# Patient Record
Sex: Female | Born: 1935
Health system: Southern US, Community
[De-identification: ages and names within clinical notes are randomized; demographics above are authoritative.]

## PROBLEM LIST (undated history)

## (undated) DIAGNOSIS — M1990 Unspecified osteoarthritis, unspecified site: Secondary | ICD-10-CM

## (undated) DIAGNOSIS — I7300 Raynaud's syndrome without gangrene: Secondary | ICD-10-CM

## (undated) DIAGNOSIS — R51 Headache: Secondary | ICD-10-CM

## (undated) DIAGNOSIS — I10 Essential (primary) hypertension: Secondary | ICD-10-CM

## (undated) HISTORY — PX: EYE SURGERY: SHX253

## (undated) HISTORY — PX: ABDOMINAL HYSTERECTOMY: SHX81

## (undated) HISTORY — PX: WRIST GANGLION EXCISION: SUR520

## (undated) HISTORY — DX: Essential (primary) hypertension: I10

## (undated) HISTORY — PX: OTHER SURGICAL HISTORY: SHX169

## (undated) HISTORY — PX: BACK SURGERY: SHX140

## (undated) HISTORY — PX: TONSILLECTOMY: SUR1361

---

## 1998-12-16 ENCOUNTER — Other Ambulatory Visit: Admission: RE | Admit: 1998-12-16 | Discharge: 1998-12-16 | Payer: Self-pay | Admitting: Obstetrics and Gynecology

## 2000-01-05 ENCOUNTER — Other Ambulatory Visit: Admission: RE | Admit: 2000-01-05 | Discharge: 2000-01-05 | Payer: Self-pay | Admitting: Obstetrics and Gynecology

## 2000-09-14 ENCOUNTER — Encounter: Admission: RE | Admit: 2000-09-14 | Discharge: 2000-09-14 | Payer: Self-pay | Admitting: Internal Medicine

## 2000-09-14 ENCOUNTER — Encounter: Payer: Self-pay | Admitting: Internal Medicine

## 2001-01-31 ENCOUNTER — Other Ambulatory Visit: Admission: RE | Admit: 2001-01-31 | Discharge: 2001-01-31 | Payer: Self-pay | Admitting: Obstetrics and Gynecology

## 2001-12-11 ENCOUNTER — Encounter: Payer: Self-pay | Admitting: Gastroenterology

## 2001-12-11 ENCOUNTER — Encounter: Admission: RE | Admit: 2001-12-11 | Discharge: 2001-12-11 | Payer: Self-pay | Admitting: Gastroenterology

## 2002-02-05 ENCOUNTER — Other Ambulatory Visit: Admission: RE | Admit: 2002-02-05 | Discharge: 2002-02-05 | Payer: Self-pay | Admitting: Obstetrics and Gynecology

## 2002-11-05 ENCOUNTER — Ambulatory Visit (HOSPITAL_COMMUNITY): Admission: RE | Admit: 2002-11-05 | Discharge: 2002-11-05 | Payer: Self-pay | Admitting: Neurosurgery

## 2002-11-05 ENCOUNTER — Encounter: Payer: Self-pay | Admitting: Neurosurgery

## 2003-01-08 ENCOUNTER — Ambulatory Visit (HOSPITAL_COMMUNITY): Admission: RE | Admit: 2003-01-08 | Discharge: 2003-01-08 | Payer: Self-pay | Admitting: Gastroenterology

## 2003-03-18 ENCOUNTER — Other Ambulatory Visit: Admission: RE | Admit: 2003-03-18 | Discharge: 2003-03-18 | Payer: Self-pay | Admitting: Obstetrics and Gynecology

## 2004-03-23 ENCOUNTER — Other Ambulatory Visit: Admission: RE | Admit: 2004-03-23 | Discharge: 2004-03-23 | Payer: Self-pay | Admitting: Obstetrics and Gynecology

## 2004-03-27 ENCOUNTER — Ambulatory Visit (HOSPITAL_COMMUNITY): Admission: RE | Admit: 2004-03-27 | Discharge: 2004-03-27 | Payer: Self-pay | Admitting: Obstetrics and Gynecology

## 2004-08-10 ENCOUNTER — Other Ambulatory Visit: Admission: RE | Admit: 2004-08-10 | Discharge: 2004-08-10 | Payer: Self-pay | Admitting: Obstetrics and Gynecology

## 2005-07-19 ENCOUNTER — Encounter: Admission: RE | Admit: 2005-07-19 | Discharge: 2005-07-19 | Payer: Self-pay | Admitting: Orthopaedic Surgery

## 2005-08-16 ENCOUNTER — Encounter: Admission: RE | Admit: 2005-08-16 | Discharge: 2005-08-16 | Payer: Self-pay | Admitting: Orthopaedic Surgery

## 2006-08-12 ENCOUNTER — Encounter: Admission: RE | Admit: 2006-08-12 | Discharge: 2006-08-12 | Payer: Self-pay | Admitting: Orthopaedic Surgery

## 2006-08-29 ENCOUNTER — Encounter: Admission: RE | Admit: 2006-08-29 | Discharge: 2006-08-29 | Payer: Self-pay | Admitting: Orthopaedic Surgery

## 2010-02-15 ENCOUNTER — Encounter: Payer: Self-pay | Admitting: Orthopaedic Surgery

## 2010-12-08 ENCOUNTER — Other Ambulatory Visit: Payer: Self-pay | Admitting: Neurosurgery

## 2011-01-12 ENCOUNTER — Other Ambulatory Visit: Payer: Self-pay | Admitting: Neurosurgery

## 2011-01-22 ENCOUNTER — Other Ambulatory Visit (HOSPITAL_COMMUNITY): Payer: Self-pay

## 2011-01-22 ENCOUNTER — Encounter (HOSPITAL_COMMUNITY): Payer: Self-pay

## 2011-01-29 ENCOUNTER — Other Ambulatory Visit (HOSPITAL_COMMUNITY): Payer: Self-pay

## 2011-02-02 ENCOUNTER — Encounter (HOSPITAL_COMMUNITY)
Admission: RE | Admit: 2011-02-02 | Discharge: 2011-02-02 | Disposition: A | Discharge status: Home / Self Care | Payer: Medicare Other | Source: Ambulatory Visit | Attending: Neurosurgery | Admitting: Neurosurgery

## 2011-02-02 ENCOUNTER — Encounter (HOSPITAL_COMMUNITY): Payer: Self-pay

## 2011-02-02 ENCOUNTER — Other Ambulatory Visit: Payer: Self-pay

## 2011-02-02 HISTORY — DX: Unspecified osteoarthritis, unspecified site: M19.90

## 2011-02-02 HISTORY — DX: Headache: R51

## 2011-02-02 LAB — BASIC METABOLIC PANEL
BUN: 22 mg/dL (ref 6–23)
Chloride: 105 mEq/L (ref 96–112)
GFR calc Af Amer: >90 mL/min (ref >90)
GFR calc non Af Amer: 84 mL/min — ABNORMAL LOW (ref >90)
Potassium: 4.2 mEq/L (ref 3.5–5.1)
Sodium: 143 mEq/L (ref 135–145)

## 2011-02-02 LAB — TYPE AND SCREEN
ABO/RH(D): B POS
Antibody Screen: NEGATIVE

## 2011-02-02 LAB — SURGICAL PCR SCREEN
MRSA, PCR: NEGATIVE
Staphylococcus aureus: POSITIVE — AB

## 2011-02-02 LAB — CBC
HCT: 38.7 % (ref 36.0–46.0)
Hemoglobin: 13.2 g/dL (ref 12.0–15.0)
MCHC: 34.1 g/dL (ref 30.0–36.0)
RBC: 3.91 MIL/uL (ref 3.87–5.11)
WBC: 6.1 10*3/uL (ref 4.0–10.5)

## 2011-02-02 LAB — ABO/RH: ABO/RH(D): B POS

## 2011-02-02 NOTE — Pre-Procedure Instructions (Signed)
22 Danielle Hernandez  02/02/2011   Your procedure is scheduled on: 02-08-2011 @ 7:30 AM Report to [facility] at 5:30 AM.  Call this number if you have problems the morning of surgery: [phone]   Remember:   Do not eat food:After Midnight.  May have clear liquids: up to 4 Hours before arrival.  Clear liquids include soda, tea, black coffee, apple or grape juice, broth.1:30AM  Take these medicines the morning of surgery with A SIP OF WATER:hydrocodone   Do not wear jewelry, make-up or nail polish.  Do not wear lotions, powders, or perfumes. You may wear deodorant.  Do not shave 48 hours prior to surgery.  Do not bring valuables to the hospital.  Contacts, dentures or bridgework may not be worn into surgery.  Leave suitcase in the car. After surgery it may be brought to your room.  For patients admitted to the hospital, checkout time is 11:00 AM the day of discharge.    Special Instructions: CHG Shower Use Special Wash: 1/2 bottle night before surgery and 1/2 bottle morning of surgery.   Please read over the following fact sheets that you were given: Pain Booklet, Blood Transfusion Information, MRSA Information and Surgical Site Infection Prevention

## 2011-02-07 MED ORDER — CEFAZOLIN SODIUM 1-5 GM-% IV SOLN
1.0000 g | INTRAVENOUS | Status: AC
Start: 2011-02-08 — End: 2011-02-08
  Administered 2011-02-08 (×2): .5 g via INTRAVENOUS
  Administered 2011-02-08: 1 g via INTRAVENOUS
  Filled 2011-02-07: qty 50

## 2011-02-08 ENCOUNTER — Inpatient Hospital Stay (HOSPITAL_COMMUNITY)
Admission: RE | Admit: 2011-02-08 | Discharge: 2011-02-10 | DRG: 460 | Disposition: A | Discharge status: Home / Self Care | Payer: Medicare Other | Source: Ambulatory Visit | Attending: Neurosurgery | Admitting: Neurosurgery

## 2011-02-08 ENCOUNTER — Encounter (HOSPITAL_COMMUNITY): Payer: Self-pay | Admitting: *Deleted

## 2011-02-08 ENCOUNTER — Inpatient Hospital Stay (HOSPITAL_COMMUNITY): Payer: Medicare Other

## 2011-02-08 ENCOUNTER — Inpatient Hospital Stay (HOSPITAL_COMMUNITY): Payer: Medicare Other | Admitting: *Deleted

## 2011-02-08 ENCOUNTER — Encounter (HOSPITAL_COMMUNITY)
Admission: RE | Disposition: A | Discharge status: Home / Self Care | Payer: Self-pay | Source: Ambulatory Visit | Attending: Neurosurgery

## 2011-02-08 DIAGNOSIS — Z79899 Other long term (current) drug therapy: Secondary | ICD-10-CM

## 2011-02-08 DIAGNOSIS — Z23 Encounter for immunization: Secondary | ICD-10-CM

## 2011-02-08 DIAGNOSIS — Z01812 Encounter for preprocedural laboratory examination: Secondary | ICD-10-CM

## 2011-02-08 DIAGNOSIS — M48061 Spinal stenosis, lumbar region without neurogenic claudication: Secondary | ICD-10-CM | POA: Diagnosis present

## 2011-02-08 DIAGNOSIS — M810 Age-related osteoporosis without current pathological fracture: Secondary | ICD-10-CM | POA: Diagnosis present

## 2011-02-08 DIAGNOSIS — M4120 Other idiopathic scoliosis, site unspecified: Secondary | ICD-10-CM | POA: Diagnosis present

## 2011-02-08 DIAGNOSIS — M5137 Other intervertebral disc degeneration, lumbosacral region: Secondary | ICD-10-CM | POA: Diagnosis present

## 2011-02-08 DIAGNOSIS — M51379 Other intervertebral disc degeneration, lumbosacral region without mention of lumbar back pain or lower extremity pain: Secondary | ICD-10-CM | POA: Diagnosis present

## 2011-02-08 DIAGNOSIS — M47817 Spondylosis without myelopathy or radiculopathy, lumbosacral region: Secondary | ICD-10-CM | POA: Diagnosis present

## 2011-02-08 DIAGNOSIS — M5126 Other intervertebral disc displacement, lumbar region: Principal | ICD-10-CM | POA: Diagnosis present

## 2011-02-08 SURGERY — POSTERIOR LUMBAR FUSION 3 LEVEL
Anesthesia: General | Site: Back | Laterality: Bilateral | Wound class: Clean

## 2011-02-08 MED ORDER — ROCURONIUM BROMIDE 100 MG/10ML IV SOLN
INTRAVENOUS | Status: DC | PRN
  Administered 2011-02-08: 50 mg via INTRAVENOUS

## 2011-02-08 MED ORDER — KETOROLAC TROMETHAMINE 30 MG/ML IJ SOLN
INTRAMUSCULAR | Status: AC
  Filled 2011-02-08: qty 1

## 2011-02-08 MED ORDER — VECURONIUM BROMIDE 10 MG IV SOLR
INTRAVENOUS | Status: DC | PRN
  Administered 2011-02-08 (×2): 3 mg via INTRAVENOUS
  Administered 2011-02-08: 2 mg via INTRAVENOUS
  Administered 2011-02-08 (×3): 4 mg via INTRAVENOUS
  Administered 2011-02-08: 2 mg via INTRAVENOUS

## 2011-02-08 MED ORDER — CEFAZOLIN SODIUM 1-5 GM-% IV SOLN
INTRAVENOUS | Status: AC
  Filled 2011-02-08: qty 50

## 2011-02-08 MED ORDER — LACTATED RINGERS IV SOLN
INTRAVENOUS | Status: DC | PRN
  Administered 2011-02-08 (×2): via INTRAVENOUS

## 2011-02-08 MED ORDER — HYDROXYZINE HCL 50 MG/ML IM SOLN
50.0000 mg | INTRAMUSCULAR | Status: DC | PRN
  Filled 2011-02-08: qty 1

## 2011-02-08 MED ORDER — ONDANSETRON HCL 4 MG/2ML IJ SOLN
INTRAMUSCULAR | Status: DC | PRN
  Administered 2011-02-08: 4 mg via INTRAVENOUS

## 2011-02-08 MED ORDER — FENTANYL CITRATE 0.05 MG/ML IJ SOLN
INTRAMUSCULAR | Status: DC | PRN
  Administered 2011-02-08 (×2): 50 ug via INTRAVENOUS
  Administered 2011-02-08: 100 ug via INTRAVENOUS
  Administered 2011-02-08 (×2): 50 ug via INTRAVENOUS
  Administered 2011-02-08: 100 ug via INTRAVENOUS
  Administered 2011-02-08 (×2): 50 ug via INTRAVENOUS

## 2011-02-08 MED ORDER — ACETAMINOPHEN 650 MG RE SUPP: 650.0000 mg | RECTAL | Status: DC | PRN

## 2011-02-08 MED ORDER — KETOROLAC TROMETHAMINE 30 MG/ML IJ SOLN
30.0000 mg | Freq: Once | INTRAMUSCULAR | Status: AC
  Administered 2011-02-08: 30 mg via INTRAVENOUS

## 2011-02-08 MED ORDER — SODIUM CHLORIDE 0.9 % IR SOLN
Status: DC | PRN
  Administered 2011-02-08: 09:00:00

## 2011-02-08 MED ORDER — GLYCOPYRROLATE 0.2 MG/ML IJ SOLN
INTRAMUSCULAR | Status: DC | PRN
  Administered 2011-02-08: .5 mg via INTRAVENOUS

## 2011-02-08 MED ORDER — BACITRACIN 50000 UNITS IM SOLR
INTRAMUSCULAR | Status: AC
  Filled 2011-02-08: qty 1

## 2011-02-08 MED ORDER — SODIUM CHLORIDE 0.9 % IJ SOLN: 9.0000 mL | INTRAMUSCULAR | Status: DC | PRN

## 2011-02-08 MED ORDER — OXYCODONE-ACETAMINOPHEN 5-325 MG PO TABS
1.0000 | ORAL_TABLET | ORAL | Status: DC | PRN
  Administered 2011-02-10: 2 via ORAL
  Filled 2011-02-08: qty 2

## 2011-02-08 MED ORDER — LACTATED RINGERS IV SOLN: INTRAVENOUS | Status: DC

## 2011-02-08 MED ORDER — PROPOFOL 10 MG/ML IV EMUL
INTRAVENOUS | Status: DC | PRN
  Administered 2011-02-08: 130 mg via INTRAVENOUS

## 2011-02-08 MED ORDER — MORPHINE SULFATE 2 MG/ML IJ SOLN: 0.0500 mg/kg | INTRAMUSCULAR | Status: DC | PRN

## 2011-02-08 MED ORDER — SODIUM CHLORIDE 0.9 % IV SOLN: 250.0000 mL | INTRAVENOUS | Status: DC

## 2011-02-08 MED ORDER — ACETAMINOPHEN 325 MG PO TABS
650.0000 mg | ORAL_TABLET | ORAL | Status: DC | PRN
  Administered 2011-02-08 – 2011-02-09 (×2): 650 mg via ORAL
  Administered 2011-02-09: 325 mg via ORAL
  Filled 2011-02-08 (×2): qty 2
  Filled 2011-02-08: qty 1

## 2011-02-08 MED ORDER — PROMETHAZINE HCL 25 MG/ML IJ SOLN: 6.2500 mg | INTRAMUSCULAR | Status: DC | PRN

## 2011-02-08 MED ORDER — PNEUMOCOCCAL VAC POLYVALENT 25 MCG/0.5ML IJ INJ
0.5000 mL | INJECTION | INTRAMUSCULAR | Status: AC
  Administered 2011-02-09: 0.5 mL via INTRAMUSCULAR
  Filled 2011-02-08: qty 0.5

## 2011-02-08 MED ORDER — ZOLPIDEM TARTRATE 5 MG PO TABS
5.0000 mg | ORAL_TABLET | Freq: Every evening | ORAL | Status: DC | PRN
  Administered 2011-02-09: 5 mg via ORAL
  Filled 2011-02-08: qty 1

## 2011-02-08 MED ORDER — CYCLOBENZAPRINE HCL 10 MG PO TABS
5.0000 mg | ORAL_TABLET | Freq: Three times a day (TID) | ORAL | Status: DC | PRN
  Administered 2011-02-08 – 2011-02-09 (×2): 10 mg via ORAL
  Filled 2011-02-08 (×2): qty 1

## 2011-02-08 MED ORDER — DIPHENHYDRAMINE HCL 12.5 MG/5ML PO ELIX: 12.5000 mg | ORAL_SOLUTION | Freq: Four times a day (QID) | ORAL | Status: DC | PRN

## 2011-02-08 MED ORDER — ONDANSETRON HCL 4 MG/2ML IJ SOLN: 4.0000 mg | Freq: Four times a day (QID) | INTRAMUSCULAR | Status: DC | PRN

## 2011-02-08 MED ORDER — SODIUM CHLORIDE 0.9 % IV SOLN
INTRAVENOUS | Status: AC
  Filled 2011-02-08: qty 500

## 2011-02-08 MED ORDER — MEPERIDINE HCL 25 MG/ML IJ SOLN: 6.2500 mg | INTRAMUSCULAR | Status: DC | PRN

## 2011-02-08 MED ORDER — HYDROMORPHONE HCL PF 1 MG/ML IJ SOLN: 0.2500 mg | INTRAMUSCULAR | Status: DC | PRN

## 2011-02-08 MED ORDER — MENTHOL 3 MG MT LOZG: 1.0000 | LOZENGE | OROMUCOSAL | Status: DC | PRN

## 2011-02-08 MED ORDER — THROMBIN 20000 UNITS EX KIT
PACK | OROMUCOSAL | Status: DC | PRN
  Administered 2011-02-08 (×2): via TOPICAL

## 2011-02-08 MED ORDER — HYDROCODONE-ACETAMINOPHEN 5-325 MG PO TABS
1.0000 | ORAL_TABLET | ORAL | Status: DC | PRN
  Administered 2011-02-09: 2 via ORAL
  Administered 2011-02-09: 1 via ORAL
  Administered 2011-02-10: 2 via ORAL
  Filled 2011-02-08: qty 2
  Filled 2011-02-08: qty 1
  Filled 2011-02-08: qty 2

## 2011-02-08 MED ORDER — MIDAZOLAM HCL 5 MG/5ML IJ SOLN
INTRAMUSCULAR | Status: DC | PRN
  Administered 2011-02-08 (×2): 1 mg via INTRAVENOUS

## 2011-02-08 MED ORDER — NEOSTIGMINE METHYLSULFATE 1 MG/ML IJ SOLN
INTRAMUSCULAR | Status: DC | PRN
  Administered 2011-02-08: 3 mg via INTRAVENOUS

## 2011-02-08 MED ORDER — LACTATED RINGERS IV SOLN
INTRAVENOUS | Status: DC | PRN
  Administered 2011-02-08 (×2): via INTRAVENOUS

## 2011-02-08 MED ORDER — CEFAZOLIN SODIUM 1-5 GM-% IV SOLN
1.0000 g | INTRAVENOUS | Status: AC
  Filled 2011-02-08: qty 50

## 2011-02-08 MED ORDER — ESTROGENS CONJUGATED 0.625 MG PO TABS
0.6250 mg | ORAL_TABLET | ORAL | Status: DC
  Administered 2011-02-10: 0.625 mg via ORAL
  Filled 2011-02-08 (×2): qty 1

## 2011-02-08 MED ORDER — BISACODYL 10 MG RE SUPP: 10.0000 mg | Freq: Every day | RECTAL | Status: DC | PRN

## 2011-02-08 MED ORDER — BUPIVACAINE HCL (PF) 0.5 % IJ SOLN
INTRAMUSCULAR | Status: DC | PRN
  Administered 2011-02-08: 12.5 mL

## 2011-02-08 MED ORDER — KCL IN DEXTROSE-NACL 20-5-0.45 MEQ/L-%-% IV SOLN
INTRAVENOUS | Status: DC
  Administered 2011-02-08 (×2): via INTRAVENOUS
  Filled 2011-02-08 (×11): qty 1000

## 2011-02-08 MED ORDER — DIPHENHYDRAMINE HCL 50 MG/ML IJ SOLN: 12.5000 mg | Freq: Four times a day (QID) | INTRAMUSCULAR | Status: DC | PRN

## 2011-02-08 MED ORDER — MAGNESIUM HYDROXIDE 400 MG/5ML PO SUSP: 30.0000 mL | Freq: Every day | ORAL | Status: DC | PRN

## 2011-02-08 MED ORDER — HYDROXYZINE HCL 50 MG PO TABS
50.0000 mg | ORAL_TABLET | ORAL | Status: DC | PRN
  Filled 2011-02-08: qty 1

## 2011-02-08 MED ORDER — 0.9 % SODIUM CHLORIDE (POUR BTL) OPTIME
TOPICAL | Status: DC | PRN
  Administered 2011-02-08 (×3): 1000 mL

## 2011-02-08 MED ORDER — LIDOCAINE-EPINEPHRINE 1 %-1:100000 IJ SOLN
INTRAMUSCULAR | Status: DC | PRN
  Administered 2011-02-08: 12.5 mL

## 2011-02-08 MED ORDER — SODIUM CHLORIDE 0.9 % IJ SOLN: 3.0000 mL | INTRAMUSCULAR | Status: DC | PRN

## 2011-02-08 MED ORDER — NALOXONE HCL 0.4 MG/ML IJ SOLN: 0.4000 mg | INTRAMUSCULAR | Status: DC | PRN

## 2011-02-08 MED ORDER — THERA M PLUS PO TABS
1.0000 | ORAL_TABLET | Freq: Every day | ORAL | Status: DC
  Administered 2011-02-09 – 2011-02-10 (×2): 1 via ORAL
  Filled 2011-02-08 (×3): qty 1

## 2011-02-08 MED ORDER — MORPHINE SULFATE 4 MG/ML IJ SOLN: 4.0000 mg | INTRAMUSCULAR | Status: DC | PRN

## 2011-02-08 MED ORDER — KETOROLAC TROMETHAMINE 30 MG/ML IJ SOLN
30.0000 mg | Freq: Four times a day (QID) | INTRAMUSCULAR | Status: DC
  Administered 2011-02-08 – 2011-02-10 (×7): 30 mg via INTRAVENOUS
  Filled 2011-02-08 (×11): qty 1

## 2011-02-08 MED ORDER — SODIUM CHLORIDE 0.9 % IJ SOLN
3.0000 mL | Freq: Two times a day (BID) | INTRAMUSCULAR | Status: DC
  Administered 2011-02-08 – 2011-02-10 (×4): 3 mL via INTRAVENOUS

## 2011-02-08 MED ORDER — KCL IN DEXTROSE-NACL 20-5-0.45 MEQ/L-%-% IV SOLN
INTRAVENOUS | Status: AC
  Filled 2011-02-08: qty 1000

## 2011-02-08 MED ORDER — PHENOL 1.4 % MT LIQD: 1.0000 | OROMUCOSAL | Status: DC | PRN

## 2011-02-08 MED ORDER — MORPHINE SULFATE (PF) 1 MG/ML IV SOLN
INTRAVENOUS | Status: DC
  Administered 2011-02-08: 15:00:00 via INTRAVENOUS
  Administered 2011-02-08: 7 mg via INTRAVENOUS
  Administered 2011-02-09: 4 mg via INTRAVENOUS
  Administered 2011-02-09: 3 mg via INTRAVENOUS
  Filled 2011-02-08: qty 25

## 2011-02-08 MED ORDER — HETASTARCH-ELECTROLYTES 6 % IV SOLN
INTRAVENOUS | Status: DC | PRN
  Administered 2011-02-08: 09:00:00 via INTRAVENOUS

## 2011-02-08 MED ORDER — ALUM & MAG HYDROXIDE-SIMETH 200-200-20 MG/5ML PO SUSP: 30.0000 mL | Freq: Four times a day (QID) | ORAL | Status: DC | PRN

## 2011-02-08 SURGICAL SUPPLY — 98 items
ADH SKN CLS APL DERMABOND .7 (GAUZE/BANDAGES/DRESSINGS) ×1
APL SKNCLS STERI-STRIP NONHPOA (GAUZE/BANDAGES/DRESSINGS)
BAG DECANTER FOR FLEXI CONT (MISCELLANEOUS) ×2 IMPLANT
BENZOIN TINCTURE PRP APPL 2/3 (GAUZE/BANDAGES/DRESSINGS) IMPLANT
BLADE SURG ROTATE 9660 (MISCELLANEOUS) IMPLANT
BRUSH SCRUB EZ PLAIN DRY (MISCELLANEOUS) ×2 IMPLANT
BUR ACORN 6.0 ACORN (BURR) IMPLANT
BUR ACRON 5.0MM COATED (BURR) ×2 IMPLANT
BUR MATCHSTICK NEURO 3.0 LAGG (BURR) ×2 IMPLANT
CANISTER SUCTION 2500CC (MISCELLANEOUS) ×2 IMPLANT
CAP LCK SPNE (Orthopedic Implant) ×8 IMPLANT
CAP LOCK SPINE RADIUS (Orthopedic Implant) IMPLANT
CAP LOCKING (Orthopedic Implant) ×16 IMPLANT
CLOTH BEACON ORANGE TIMEOUT ST (SAFETY) ×2 IMPLANT
CONT SPEC 4OZ CLIKSEAL STRL BL (MISCELLANEOUS) ×4 IMPLANT
COVER BACK TABLE 24X17X13 BIG (DRAPES) IMPLANT
COVER TABLE BACK 60X90 (DRAPES) ×2 IMPLANT
DERMABOND ADVANCED (GAUZE/BANDAGES/DRESSINGS) ×1
DERMABOND ADVANCED .7 DNX12 (GAUZE/BANDAGES/DRESSINGS) ×2 IMPLANT
DRAPE C-ARM 42X72 X-RAY (DRAPES) ×4 IMPLANT
DRAPE LAPAROTOMY 100X72X124 (DRAPES) ×2 IMPLANT
DRAPE POUCH INSTRU U-SHP 10X18 (DRAPES) ×2 IMPLANT
DRAPE PROXIMA HALF (DRAPES) IMPLANT
DRAPE SURG 17X23 STRL (DRAPES) ×2 IMPLANT
DRESSING TELFA 8X3 (GAUZE/BANDAGES/DRESSINGS) IMPLANT
DRSG EMULSION OIL 3X3 NADH (GAUZE/BANDAGES/DRESSINGS) IMPLANT
DURAPREP 26ML APPLICATOR (WOUND CARE) ×2 IMPLANT
ELECT REM PT RETURN 9FT ADLT (ELECTROSURGICAL) ×2
ELECTRODE REM PT RTRN 9FT ADLT (ELECTROSURGICAL) ×1 IMPLANT
GAUZE SPONGE 4X4 16PLY XRAY LF (GAUZE/BANDAGES/DRESSINGS) ×2 IMPLANT
GLOVE BIO SURGEON STRL SZ8.5 (GLOVE) ×1 IMPLANT
GLOVE BIOGEL PI IND STRL 7.0 (GLOVE) IMPLANT
GLOVE BIOGEL PI IND STRL 8 (GLOVE) ×2 IMPLANT
GLOVE BIOGEL PI IND STRL 8.5 (GLOVE) IMPLANT
GLOVE BIOGEL PI INDICATOR 7.0 (GLOVE) ×1
GLOVE BIOGEL PI INDICATOR 8 (GLOVE) ×2
GLOVE BIOGEL PI INDICATOR 8.5 (GLOVE) ×4
GLOVE ECLIPSE 7.5 STRL STRAW (GLOVE) ×4 IMPLANT
GLOVE EXAM NITRILE LRG STRL (GLOVE) IMPLANT
GLOVE EXAM NITRILE MD LF STRL (GLOVE) ×2 IMPLANT
GLOVE EXAM NITRILE XL STR (GLOVE) IMPLANT
GLOVE EXAM NITRILE XS STR PU (GLOVE) IMPLANT
GLOVE SS BIOGEL STRL SZ 8 (GLOVE) IMPLANT
GLOVE SUPERSENSE BIOGEL SZ 8 (GLOVE) ×1
GLOVE SURG SS PI 6.5 STRL IVOR (GLOVE) ×1 IMPLANT
GLOVE SURG SS PI 8.0 STRL IVOR (GLOVE) ×5 IMPLANT
GOWN BRE IMP SLV AUR LG STRL (GOWN DISPOSABLE) ×3 IMPLANT
GOWN BRE IMP SLV AUR XL STRL (GOWN DISPOSABLE) ×3 IMPLANT
GOWN STRL REIN 2XL LVL4 (GOWN DISPOSABLE) ×3 IMPLANT
KIT BASIN OR (CUSTOM PROCEDURE TRAY) ×2 IMPLANT
KIT INFUSE MEDIUM (Orthopedic Implant) ×1 IMPLANT
KIT POSITION SURG JACKSON T1 (MISCELLANEOUS) ×1 IMPLANT
KIT ROOM TURNOVER OR (KITS) ×2 IMPLANT
MILL MEDIUM DISP (BLADE) ×2 IMPLANT
NDL 18GX1X1/2 (RX/OR ONLY) (NEEDLE) ×1 IMPLANT
NDL HYPO 25X1 1.5 SAFETY (NEEDLE) ×1 IMPLANT
NDL SPNL 18GX3.5 QUINCKE PK (NEEDLE) IMPLANT
NDL SPNL 22GX3.5 QUINCKE BK (NEEDLE) ×1 IMPLANT
NEEDLE 18GX1X1/2 (RX/OR ONLY) (NEEDLE) ×2 IMPLANT
NEEDLE BONE MARROW 8GAX6 (NEEDLE) IMPLANT
NEEDLE HYPO 25X1 1.5 SAFETY (NEEDLE) IMPLANT
NEEDLE SPNL 18GX3.5 QUINCKE PK (NEEDLE) ×2 IMPLANT
NEEDLE SPNL 22GX3.5 QUINCKE BK (NEEDLE) ×6 IMPLANT
NS IRRIG 1000ML POUR BTL (IV SOLUTION) ×4 IMPLANT
PACK LAMINECTOMY NEURO (CUSTOM PROCEDURE TRAY) ×2 IMPLANT
PAD ARMBOARD 7.5X6 YLW CONV (MISCELLANEOUS) ×7 IMPLANT
PATTIES SURGICAL .5 X.5 (GAUZE/BANDAGES/DRESSINGS) IMPLANT
PATTIES SURGICAL .5 X1 (DISPOSABLE) IMPLANT
PATTIES SURGICAL 1X1 (DISPOSABLE) ×2 IMPLANT
PEEK PLIF AVS 8X25X4 DEGREE (Peek) ×6 IMPLANT
ROD CROSS LINK SHORT (Rod) ×1 IMPLANT
ROD MAX 90MM (Rod) ×2 IMPLANT
SCREW 5.75X40M (Screw) ×4 IMPLANT
SCREW 5.75X45MM (Screw) ×4 IMPLANT
SLEEVE SURGEON STRL (DRAPES) ×1 IMPLANT
SPONGE GAUZE 4X4 12PLY (GAUZE/BANDAGES/DRESSINGS) ×2 IMPLANT
SPONGE LAP 4X18 X RAY DECT (DISPOSABLE) ×1 IMPLANT
SPONGE NEURO XRAY DETECT 1X3 (DISPOSABLE) ×1 IMPLANT
SPONGE SURGIFOAM ABS GEL 100 (HEMOSTASIS) ×2 IMPLANT
STAPLER SKIN PROX WIDE 3.9 (STAPLE) IMPLANT
STRIP CLOSURE SKIN 1/2X4 (GAUZE/BANDAGES/DRESSINGS) ×1 IMPLANT
STRIP VITOSS 25X100X4MM (Neuro Prosthesis/Implant) ×2 IMPLANT
SUT PROLENE 6 0 BV (SUTURE) IMPLANT
SUT VIC AB 1 CT1 18XBRD ANBCTR (SUTURE) ×2 IMPLANT
SUT VIC AB 1 CT1 8-18 (SUTURE) ×4
SUT VIC AB 2-0 CP2 18 (SUTURE) ×4 IMPLANT
SUT VIC AB 3-0 SH 8-18 (SUTURE) ×2 IMPLANT
SYR 20CC LL (SYRINGE) ×2 IMPLANT
SYR 3ML LL SCALE MARK (SYRINGE) ×8 IMPLANT
SYR 5ML LL (SYRINGE) IMPLANT
SYR CONTROL 10ML LL (SYRINGE) ×1 IMPLANT
SYR INSULIN 1ML 31GX6 SAFETY (SYRINGE) IMPLANT
TAPE CLOTH SURG 4X10 WHT LF (GAUZE/BANDAGES/DRESSINGS) ×1 IMPLANT
TOWEL OR 17X24 6PK STRL BLUE (TOWEL DISPOSABLE) ×2 IMPLANT
TOWEL OR 17X26 10 PK STRL BLUE (TOWEL DISPOSABLE) ×2 IMPLANT
TRAP SPECIMEN MUCOUS 40CC (MISCELLANEOUS) ×2 IMPLANT
TRAY FOLEY CATH 14FRSI W/METER (CATHETERS) ×2 IMPLANT
WATER STERILE IRR 1000ML POUR (IV SOLUTION) ×2 IMPLANT

## 2011-02-08 NOTE — Anesthesia Preprocedure Evaluation (Addendum)
Anesthesia Evaluation  Patient identified by MRN, date of birth, ID band Patient awake    Reviewed: Allergy & Precautions, H&P , NPO status , Patient's Chart, lab work & pertinent test results  Airway Mallampati: I TM Distance: >3 FB Neck ROM: Full    Dental  (+) Upper Dentures, Lower Dentures and Dental Advisory Given   Pulmonary neg pulmonary ROS,  clear to auscultation  Pulmonary exam normal       Cardiovascular neg cardio ROS Regular Normal    Neuro/Psych  Headaches, Negative Psych ROS   GI/Hepatic negative GI ROS, Neg liver ROS,   Endo/Other  Negative Endocrine ROS  Renal/GU negative Renal ROS  Genitourinary negative   Musculoskeletal negative musculoskeletal ROS (+)   Abdominal Normal abdominal exam  (+)   Peds  Hematology negative hematology ROS (+)   Anesthesia Other Findings   Reproductive/Obstetrics negative OB ROS                           Anesthesia Physical Anesthesia Plan  ASA: II  Anesthesia Plan: General   Post-op Pain Management:    Induction: Intravenous  Airway Management Planned: Oral ETT  Additional Equipment:   Intra-op Plan:   Post-operative Plan: Extubation in OR  Informed Consent: I have reviewed the patients History and Physical, chart, labs and discussed the procedure including the risks, benefits and alternatives for the proposed anesthesia with the patient or authorized representative who has indicated his/her understanding and acceptance.   Dental advisory given  Plan Discussed with: Anesthesiologist  Anesthesia Plan Comments:         Anesthesia Quick Evaluation

## 2011-02-08 NOTE — Transfer of Care (Signed)
Immediate Anesthesia Transfer of Care Note  Patient: KHRISTY KALAN  Procedure(s) Performed:  POSTERIOR LUMBAR FUSION 3 LEVEL - Lumbar two to five laminectomies with Lumbar two-three, three-four, four-five posterior lumbar interbody fusion with interbody prothesis, posterolateral arthrodesis, and posterior segmental instrumentation  Patient Location: PACU  Anesthesia Type: General  Level of Consciousness: patient cooperative and responds to stimulation  Airway & Oxygen Therapy: Patient Spontanous Breathing and Patient connected to face mask oxygen  Post-op Assessment: Report given to PACU RN and Post -op Vital signs reviewed and stable  Post vital signs: Reviewed Filed Vitals:   02/08/11 1400  BP: 101/46  Pulse: 76  Temp:   Resp: 17    Complications: No apparent anesthesia complications

## 2011-02-08 NOTE — Preoperative (Signed)
Beta Blockers   Reason not to administer Beta Blockers:Not Applicable 

## 2011-02-08 NOTE — Progress Notes (Signed)
Filed Vitals:   02/08/11 1515 02/08/11 1530 02/08/11 1533 02/08/11 1555  BP: 107/46 113/40  98/31  Pulse: 63 60 64 70  Temp:   96.8 F (36 C) 96.8 F (36 C)  TempSrc:    Oral  Resp: 21 18 14 18   Weight:      SpO2: 100% 98% 100% 97%    Patient doing well following surgery. Some incisional back discomfort but no significant discomfort at the lower extremities. Dressing is clean dry. Moving all  extremities well. Foley to straight drainage.   Plan: Discussed with nurse beginning ambulation this evening. If patient does well we'll DC Foley in a.m. Encouraged to ambulate.

## 2011-02-08 NOTE — H&P (Signed)
Subjective: Patient is a 76 y.o. female who is admitted for treatment of multilevel lumbar disc herniations, spondylosis, degenerative disc disease, and resulting multilevel multifactorial lumbar stenosis as well as scoliosis. Patient has had difficulties with her back for over 6 years. Initially it is more back pain now she is having radicular pain into the anterolateral left thigh but also still significant back pain. She's undergone extensive nonsurgical management including numerous spinal injections, treatment NSAIDS, muscle relaxants, and analgesics.  Symptomatically she describes pain in the low back into the lateral left hip and the anterolateral left thigh. She denies any weakness numbness or paresthesias.  Workup included x-rays and MRI scan. X-rays show pronounced degenerative lumbar scoliosis with a rotatory component, as well as asymmetric loss of disc space height with loss on the left at L2-3 and on the right at L4-5. Alignment is stable to flexion extension a lateral perspective. The MRI scan shows an L2-3-1 large disc herniation to the left causing severe stenosis the left as well as there being overall canal stenosis of the marked extent. At L3-4 this hypertrophic facet arthropathy on the right at L4-5 there is a broad-based disc protrusion, facet overgrowth, and mild right L4-5 lateral recess stenosis.   There are no active problems to display for this patient.  Past Medical History  Diagnosis Date  . Headache   . Arthritis   . Osteoporosis     Past Surgical History  Procedure Date  . Abdominal hysterectomy   . Wrist ganglion excision     right hand  . Under chin cyst removed   . Tonsillectomy     Prescriptions prior to admission  Medication Sig Dispense Refill  . alendronate (FOSAMAX) 70 MG tablet Take 70 mg by mouth every 7 (seven) days. Tuesday. Take with a full glass of water on an empty stomach.       . Calcium Carbonate-Vitamin D (CALCIUM + D PO) Take 1 tablet by  mouth 2 (two) times daily.        Marland Kitchen estrogens, conjugated, (PREMARIN) 0.625 MG tablet Take 0.625 mg by mouth every other day. Take daily for 21 days then do not take for 7 days.       Marland Kitchen HYDROcodone-acetaminophen (NORCO) 10-325 MG per tablet Take 1 tablet by mouth 2 (two) times daily as needed. For pain.       . Multiple Vitamins-Minerals (MULTIVITAMINS THER. W/MINERALS) TABS Take 1 tablet by mouth daily.        Marland Kitchen VITAMIN E PO Take 1 tablet by mouth daily.         Allergies  Allergen Reactions  . Penicillins Other (See Comments)    Unknown   PT STATES SHE  IS ALLERGIC TO SULFA AND SOMEONE TOLD HER IF YOU ARE ALLERGIC TO SULFA YOU ARE ALLERGIC TO PENICILLIN  . Sulfa Drugs Cross Reactors Rash    History  Substance Use Topics  . Smoking status: Never Smoker   . Smokeless tobacco: Not on file  . Alcohol Use: No    History reviewed. No pertinent family history.   Review of Systems A comprehensive review of systems was negative.  Objective: Vital signs in last 24 hours: Temp:  [97.9 F (36.6 C)] 97.9 F (36.6 C) (01/14 0610) Pulse Rate:  [82] 82  (01/14 0610) Resp:  [18] 18  (01/14 0610) BP: (159)/(79) 159/79 mmHg (01/14 0610) SpO2:  [99 %] 99 % (01/14 0610)  EXAM:  Lungs are clear to auscultation , the patient has symmetrical  respiratory excursion. Heart has a regular rate and rhythm normal S1 and S2 no murmur.   Abdomen is soft nontender nondistended bowel sounds are present. Extremity examination shows no clubbing cyanosis or edema. Musculoskeletal examination shows no tears were patient lumbar spinous processes or paralumbar musculature. She is limited in forward flexion about 75, limited in extension about 5-10. Straight leg raising is negative bilaterally.   Motor examination shows 5 over 5 strength in the lower extremities including the iliopsoas quadriceps dorsiflexor extensor hallicus  longus and plantar flexor bilaterally. Sensation is intact to pinprick in the distal lower  extremities. Reflexes are symmetrical bilaterally. No pathologic reflexes are present. Patient has a normal gait and stance.   Data Review:CBC    Component Value Date/Time   WBC 6.1 02/02/2011 1553   RBC 3.91 02/02/2011 1553   HGB 13.2 02/02/2011 1553   HCT 38.7 02/02/2011 1553   PLT 262 02/02/2011 1553   MCV 99.0 02/02/2011 1553   MCH 33.8 02/02/2011 1553   MCHC 34.1 02/02/2011 1553   RDW 12.9 02/02/2011 1553                          BMET    Component Value Date/Time   NA 143 02/02/2011 1553   K 4.2 02/02/2011 1553   CL 105 02/02/2011 1553   CO2 30 02/02/2011 1553   GLUCOSE 94 02/02/2011 1553   BUN 22 02/02/2011 1553   CREATININE 0.70 02/02/2011 1553   CALCIUM 10.0 02/02/2011 1553   GFRNONAA 84* 02/02/2011 1553   GFRAA >90 02/02/2011 1553     Assessment/Plan: Patient is admitted for a multilevel lumbar decompression and stabilization, specifically an L2-L5 decompressive lumbar laminectomy, bilateral posterior lumbar interbody fusion at L2-3 L3-4 and L4-5, and bilateral posterior lateral arthrodesis with postreduction patient and bone graft L2-3, L3-4, and L4-5.  I've discussed with the patient the nature of his condition, the nature the surgical procedure, the typical length of surgery, hospital stay, and overall recuperation, the limitations postoperatively, and risks of surgery. I discussed risks including risks of infection, bleeding, possibly need for transfusion, the risk of nerve root dysfunction with pain, weakness, numbness, or paresthesias, the risk of dural tear and CSF leakage and possible need for further surgery, the risk of failure of the arthrodesis and possibly for further surgery, the risk of anesthetic complications including myocardial infarction, stroke, pneumonia, and death. We discussed the need for postoperative immobilization in a lumbar brace. Understanding all this the patient does wish to proceed with surgery and is admitted for such.    Hewitt Shorts, MD 02/08/2011 7:24 AM

## 2011-02-08 NOTE — Anesthesia Postprocedure Evaluation (Signed)
  Anesthesia Post-op Note  Patient: Danielle Hernandez  Procedure(s) Performed:  POSTERIOR LUMBAR FUSION 3 LEVEL - Lumbar two to five laminectomies with Lumbar two-three, three-four, four-five posterior lumbar interbody fusion with interbody prothesis, posterolateral arthrodesis, and posterior segmental instrumentation  Patient Location: PACU  Anesthesia Type: General  Level of Consciousness: awake  Airway and Oxygen Therapy: Patient Spontanous Breathing  Post-op Pain: mild  Post-op Assessment: Post-op Vital signs reviewed  Post-op Vital Signs: stable  Complications: No apparent anesthesia complications

## 2011-02-08 NOTE — Op Note (Signed)
02/08/2011  1:43 PM  PATIENT:  Danielle Hernandez  76 y.o. female  PRE-OPERATIVE DIAGNOSIS:  lumbar herniated disc, lumbar stenosis, scoliosis, lumbar spondylosis  POST-OPERATIVE DIAGNOSIS:  lumbar herniated disc, lumbar stenosis, scoliosis, lumbar spondylosis  PROCEDURE:  Procedure(s): POSTERIOR LUMBAR FUSION 3 LEVEL, L2-L5 decompressive lumbar laminectomy, facetectomy, and foraminotomy, bilaterally, with microdissection and microsurgical technique, with decompression of the L2, L3, L4, and L5 nerve roots bilaterally, with decompression beyond that required for interbody arthrodesis, bilateral L2-3, L3-4, and L4-5 posterior lumbar interbody arthrodesis with AVS peek interbody implants with locally harvested morcellized autograft and infuse and Vitoss with bone marrow aspirate, bilateral L2-L5 posterior lateral arthrodesis with radius posterior instrumentation, Vitoss with bone marrow aspirate, and infuse.  SURGEON:  Surgeon(s): Hewitt Shorts  ASSISTANTS: Cristi Loron  ANESTHESIA:   general  EBL:  Total I/O In: 3810 [I.V.:2900; Blood:410; IV Piggyback:500] Out: 1675 [Urine:575; Blood:1100]  BLOOD ADMINISTERED:420 CC CELLSAVER  COUNT: Correct per nursing staff  DRAINS: none   SPECIMEN:  No Specimen  DICTATION: Patient was brought to the operating room is under general endotracheal anesthesia. Patient was turned to a prone position, the lumbar region was prepped with Betadine soap and solution and draped in a sterile fashion. The midline was infiltrated with local anesthetic with epinephrine. An x-ray was taken and the L2-L5 level identified. Midline incision was made, carried down to the subcutaneous tissue, to the lumbar fascia. The fascia was incised bilaterally and the paraspinal muscles were dissected from the spinous process lamina in a subperiosteal fashion. Another x-ray was taken and the L2-L5 levels were identified. Dissection was then carried out laterally exposing the  transverse processes at L2, L3, L4, and L5.  A laminectomy was then performed using double-action rongeurs, the high-speed drill, and Kerrison punches. Decompression was done of the central canal and extended laterally to include the lateral recesses bilaterally. We then extended the decompression laterally removing the medial portions of the facets. We then applied the L2, L3, L4, and L5 nerve roots and carefully decompressed each of these as they exited.  We then proceeded with a bilateral discectomy at the L2-3 L3-4 and L4-5 levels. At L2-3 there was a moderately large spondylitic disc herniation central and to the left. On the left side at L2-3 and on the right side at L4-5 the disc spaces were particularly narrowed and each of these was carefully opened up.  When the discectomy for completed we began to prepare the endplates for interbody arthrodesis using paddle curettes to remove the cartilaginous endplates surfaces. We measured the height of the interbody space and selected 8 x 25 x 4 AVS peek interbody implants for each level bilaterally. Each of these implants was packed a combination of locally harvested morcellized autograft and infuse. At each level the implant was first placed on one side retracting the thecal sac and nerve root medially, then packed the midline of each interspace with additional infuse and Vitoss with bone marrow aspirate, and then placed a second implant on the opposite side, again retracting the thecal sac and nerve root medially.  Once the interbody arthrodesis was completed we proceeded with posterolateral arthrodesis. The C-arm fluoroscope was draped and brought in the field to provide guidance and placement of pedicle screws at each level. We then identified pedicle entry points bilaterally at each level, each pedicle was probed, examined with the ball probe, good bony surfaces were found, each was then tapped with a 5.25 mm tap. We then again examined the each  with a  ball probe, again good bony surfaces were found, and we placed 5.75 x 40 mm screws bilaterally at L2 and L5 and 5.75 x 45 mm screws bilaterally at L3 and L4.  We then selected 90 mm pre-lordosed rods, there placed within the screw heads, and secured with locking caps. Once all 8 locking caps were placed final tightening was done against a counter torque. We then packed the lateral gutter over the transverse processes and intertransverse space with infuse and Vitoss with bone marrow aspirate. We placed a small variable cross-link between the screws at L3 and L4. It was secured to the rods and then tightened in the middle.  The wound was irrigated numerous times the procedure with saline solution and bacitracin solution. Hemostasis was established. We then proceeded with closure. Paraspinal muscles and deep fascia were closed with undyed 1 Vicryl sutures, subcutaneous and subcuticular layers were closed with interrupted inverted 2-0 undyed Vicryl sutures. Skin edges were approximated with Dermabond. Wound was dressed with sterile gauze   PLAN OF CARE: Admit to inpatient   PATIENT DISPOSITION:  PACU - hemodynamically stable.   Delay start of Pharmacological VTE agent (>24hrs) due to surgical blood loss or risk of bleeding:  yes

## 2011-02-09 NOTE — Progress Notes (Signed)
Filed Vitals:   02/08/11 2344 02/09/11 0200 02/09/11 0400 02/09/11 0500  BP:  101/38  110/46  Pulse:  90  79  Temp:  98.4 F (36.9 C)  98.3 F (36.8 C)  TempSrc:  Oral  Oral  Resp: 16 17 16 17   Weight:      SpO2: 100% 94% 99% 100%    Patient resting comfortably in bed, has eaten most of her breakfast. She did get up and out of bed to a chair, but had some lightheadedness last night when she was sitting up. Moving all extremities well.   Plan: Will mobilize today, encouraged patient to ambulate 3-4 times today in the [place]. We'll DC Foley and PCA.

## 2011-02-09 NOTE — Progress Notes (Signed)
UR COMPLETED  

## 2011-02-10 MED ORDER — OXYCODONE-ACETAMINOPHEN 5-325 MG PO TABS: 1.0000 | ORAL_TABLET | ORAL | Status: DC | PRN

## 2011-02-10 NOTE — Progress Notes (Signed)
D/C instructions given to patient and family. No further questions. Pt in no signs of acute distress. D/C home

## 2011-02-10 NOTE — Discharge Summary (Signed)
Physician Discharge Summary  Patient ID: Danielle Hernandez MRN: [id number] DOB/AGE: 74-Jan-1937 74 y.o.  Admit date: 02/08/2011 Discharge date: 02/10/2011  Admission Diagnoses: Lumbar disc herniation, lumbar stenosis, lumbar scoliosis, lumbar spondylosis  Discharge Diagnoses: Lumbar disc herniation, lumbar stenosis, lumbar scoliosis, lumbar spondylosis  Discharged Condition: good  Hospital Course: Patient was admitted underwent a L2-L5 decompressive lumbar laminectomy at L2-3 L3-4 and L4-5 PLIFand PLA. She is done well following surgery, with excellent relief of her radiculopathy. She is up and living in the halls. The wound is healing well, there is no erythema, swelling, or drainage. She her family have been given instructions regarding wound care and activities following discharge, as well as followup in the office.  Consults: none  Significant Diagnostic Studies: None  Treatments: surgery: See above  Discharge Exam: Blood pressure 110/45, pulse 85, temperature 98.9 F (37.2 C), temperature source Axillary, resp. rate 17, weight 50.6 kg (111 lb 8.8 oz), SpO2 98.00%. Wound is clean and dry. Moving all extremities well.  Disposition: Home   Current Discharge Medication List    START taking these medications   Details  oxyCODONE-acetaminophen (PERCOCET) 5-325 MG per tablet Take 1-2 tablets by mouth every 4 (four) hours as needed for pain. Qty: 75 tablet, Refills: 0      CONTINUE these medications which have NOT CHANGED   Details  alendronate (FOSAMAX) 70 MG tablet Take 70 mg by mouth every 7 (seven) days. Tuesday. Take with a full glass of water on an empty stomach.     Calcium Carbonate-Vitamin D (CALCIUM + D PO) Take 1 tablet by mouth 2 (two) times daily.      estrogens, conjugated, (PREMARIN) 0.625 MG tablet Take 0.625 mg by mouth every other day. Take daily for 21 days then do not take for 7 days.     Multiple Vitamins-Minerals (MULTIVITAMINS THER. W/MINERALS) TABS Take 1  tablet by mouth daily.      VITAMIN E PO Take 1 tablet by mouth daily.        STOP taking these medications     HYDROcodone-acetaminophen (NORCO) 10-325 MG per tablet          Signed: Hewitt Shorts, MD 02/10/2011, 12:52 PM

## 2011-02-11 ENCOUNTER — Encounter (HOSPITAL_COMMUNITY): Payer: Self-pay

## 2011-02-11 ENCOUNTER — Emergency Department (HOSPITAL_COMMUNITY)
Admission: EM | Admit: 2011-02-11 | Discharge: 2011-02-11 | Disposition: A | Discharge status: Home / Self Care | Payer: Medicare Other | Attending: Emergency Medicine | Admitting: Emergency Medicine

## 2011-02-11 DIAGNOSIS — R609 Edema, unspecified: Secondary | ICD-10-CM

## 2011-02-11 DIAGNOSIS — D649 Anemia, unspecified: Secondary | ICD-10-CM

## 2011-02-11 DIAGNOSIS — I7300 Raynaud's syndrome without gangrene: Secondary | ICD-10-CM | POA: Insufficient documentation

## 2011-02-11 DIAGNOSIS — M810 Age-related osteoporosis without current pathological fracture: Secondary | ICD-10-CM | POA: Insufficient documentation

## 2011-02-11 DIAGNOSIS — M79609 Pain in unspecified limb: Secondary | ICD-10-CM | POA: Insufficient documentation

## 2011-02-11 DIAGNOSIS — M7989 Other specified soft tissue disorders: Secondary | ICD-10-CM | POA: Insufficient documentation

## 2011-02-11 HISTORY — DX: Raynaud's syndrome without gangrene: I73.00

## 2011-02-11 LAB — PROTIME-INR: INR: 1.01 (ref 0.00–1.49)

## 2011-02-11 LAB — APTT: aPTT: 35 seconds (ref 24–37)

## 2011-02-11 LAB — BASIC METABOLIC PANEL
Calcium: 9.3 mg/dL (ref 8.4–10.5)
GFR calc Af Amer: >90 mL/min (ref >90)
GFR calc non Af Amer: 86 mL/min — ABNORMAL LOW (ref >90)
Potassium: 3.6 mEq/L (ref 3.5–5.1)
Sodium: 137 mEq/L (ref 135–145)

## 2011-02-11 LAB — CBC
MCHC: 34.1 g/dL (ref 30.0–36.0)
RDW: 12.9 % (ref 11.5–15.5)

## 2011-02-11 MED ORDER — ENOXAPARIN SODIUM 100 MG/ML ~~LOC~~ SOLN: 50.0000 mg | Freq: Once | SUBCUTANEOUS | Status: DC

## 2011-02-11 NOTE — ED Notes (Signed)
Pt presents with pain and swelling to L leg x 2 days.  Pt discharged from here yesterday after back surgery.

## 2011-02-11 NOTE — ED Notes (Signed)
Called switchboard for vascular doppler tech to call at [phone].

## 2011-02-11 NOTE — ED Provider Notes (Signed)
History     CSN: [id number]  Arrival date & time 02/11/11  1722   First MD Initiated Contact with Patient 02/11/11 1843      Chief Complaint  Patient presents with  . Leg Pain    (Consider location/radiation/quality/duration/timing/severity/associated sxs/prior treatment) HPI Patient presents emergent complaints of swelling and pain to her left leg for the last 2 days. Patient was recently in the hospital for back surgery and was just released yesterday patient called her neurosurgeon was told to come to the emergency department. Patient states it's not very painful but she has noticed that the swelling. She has not had any fevers, chills, chest pain or shortness of breath. Past Medical History  Diagnosis Date  . Headache   . Arthritis   . Osteoporosis   . Raynaud disease     Past Surgical History  Procedure Date  . Abdominal hysterectomy   . Wrist ganglion excision     right hand  . Under chin cyst removed   . Tonsillectomy   . Back surgery     No family history on file.  History  Substance Use Topics  . Smoking status: Never Smoker   . Smokeless tobacco: Not on file  . Alcohol Use: No    OB History    Grav Para Term Preterm Abortions TAB SAB Ect Mult Living                  Review of Systems  All other systems reviewed and are negative.    Allergies  Penicillins and Sulfa drugs cross reactors  Home Medications   Current Outpatient Rx  Name Route Sig Dispense Refill  . ALENDRONATE SODIUM 70 MG PO TABS Oral Take 70 mg by mouth every 7 (seven) days. Tuesday. Take with a full glass of water on an empty stomach.     Marland Kitchen CALCIUM + D PO Oral Take 1 tablet by mouth 2 (two) times daily.      Marland Kitchen ESTROGENS CONJUGATED 0.625 MG PO TABS Oral Take 0.625 mg by mouth every other day. Take daily for 21 days then do not take for 7 days.     Carma Leaven M PLUS PO TABS Oral Take 1 tablet by mouth daily.      . OXYCODONE-ACETAMINOPHEN 5-325 MG PO TABS Oral Take 1-2 tablets by  mouth every 4 (four) hours as needed. For pain.    Marland Kitchen VITAMIN E PO Oral Take 1 tablet by mouth daily.        BP 114/70  Pulse 108  Temp(Src) 98.9 F (37.2 C) (Oral)  Resp 18  SpO2 100%  Physical Exam  Nursing note and vitals reviewed. Constitutional: She appears well-developed and well-nourished. No distress.  HENT:  Head: Normocephalic and atraumatic.  Right Ear: External ear normal.  Left Ear: External ear normal.  Eyes: Conjunctivae are normal. Right eye exhibits no discharge. Left eye exhibits no discharge. No scleral icterus.  Neck: Neck supple. No tracheal deviation present.  Cardiovascular: Normal rate.   Pulmonary/Chest: Effort normal. No stridor. No respiratory distress.  Musculoskeletal: She exhibits edema. She exhibits no tenderness.       Mild edema left calf and foot, no erythema, no significant tenderness  Neurological: She is alert. Cranial nerve deficit: no gross deficits.  Skin: Skin is warm and dry. No rash noted.  Psychiatric: She has a normal mood and affect.    ED Course  Procedures (including critical care time)  Labs Reviewed  CBC - Abnormal; Notable  for the following:    RBC 2.79 (*)    Hemoglobin 9.4 (*)    HCT 27.6 (*)    All other components within normal limits  BASIC METABOLIC PANEL - Abnormal; Notable for the following:    Glucose, Bld 110 (*)    GFR calc non Af Amer 86 (*)    All other components within normal limits  PROTIME-INR  APTT   No results found.    MDM  The patient has calf pain recent surgery. She was sent by her surgeon here to rule out DVT. The vascular technician was called to have that completed.  7:36 PM Vascular tech was called.  They will not come in this evening.  Patient is noted to have anemia which is new since her previous result. However this is prior to her surgery. Patient has not noticed any blood in her stool is possible this is postoperative loss. With her anemia I will hold off on Lovenox as over I feel  her risk of DVT is rather low based on exam.     Celene Kras, MD 02/11/11 2009

## 2011-02-12 ENCOUNTER — Ambulatory Visit (HOSPITAL_COMMUNITY)
Admission: RE | Admit: 2011-02-12 | Discharge: 2011-02-12 | Disposition: A | Discharge status: Home / Self Care | Payer: Medicare Other | Source: Ambulatory Visit | Attending: Emergency Medicine | Admitting: Emergency Medicine

## 2011-02-12 ENCOUNTER — Ambulatory Visit (HOSPITAL_COMMUNITY): Payer: Medicare Other

## 2011-02-12 DIAGNOSIS — M7989 Other specified soft tissue disorders: Secondary | ICD-10-CM

## 2011-02-12 DIAGNOSIS — M79609 Pain in unspecified limb: Secondary | ICD-10-CM | POA: Insufficient documentation

## 2011-02-12 NOTE — Progress Notes (Signed)
*  PRELIMINARY RESULTS* Left:  No evidence of DVT, superficial thrombosis, or Baker's cyst.  Shane Melby,RVS, [place] D. 02/12/2011, 1:28 PM

## 2011-02-26 ENCOUNTER — Other Ambulatory Visit: Payer: Self-pay | Admitting: Neurosurgery

## 2011-02-26 DIAGNOSIS — M5126 Other intervertebral disc displacement, lumbar region: Secondary | ICD-10-CM

## 2011-03-01 ENCOUNTER — Ambulatory Visit
Admission: RE | Admit: 2011-03-01 | Discharge: 2011-03-01 | Disposition: A | Discharge status: Home / Self Care | Payer: Medicare Other | Source: Ambulatory Visit | Attending: Neurosurgery | Admitting: Neurosurgery

## 2011-03-01 DIAGNOSIS — M5126 Other intervertebral disc displacement, lumbar region: Secondary | ICD-10-CM

## 2012-04-19 ENCOUNTER — Ambulatory Visit (INDEPENDENT_AMBULATORY_CARE_PROVIDER_SITE_OTHER): Payer: Medicare Other | Admitting: General Surgery

## 2012-04-26 ENCOUNTER — Encounter (INDEPENDENT_AMBULATORY_CARE_PROVIDER_SITE_OTHER): Payer: Self-pay | Admitting: General Surgery

## 2012-04-26 ENCOUNTER — Ambulatory Visit (INDEPENDENT_AMBULATORY_CARE_PROVIDER_SITE_OTHER): Payer: Medicare Other | Admitting: General Surgery

## 2012-04-26 VITALS — BP 160/84 | HR 72 | Temp 97.9°F | Resp 14 | Ht 59.0 in | Wt 118.2 lb

## 2012-04-26 DIAGNOSIS — R159 Full incontinence of feces: Secondary | ICD-10-CM | POA: Insufficient documentation

## 2012-04-26 NOTE — Patient Instructions (Signed)
Bowel Incontinence  What is incontinence? Incontinence is the impaired ability to control gas or stool. Its severity ranges from mild difficulty with gas control to severe loss of control over liquid and formed stools. Incontinence to stool is a common problem, but often it is not discussed due to embarassment. What causes incontinence? There are many causes of incontinence. Injury during childbirth is one of the most common causes. These injuries may cause a tear in the anal muscles. The nerves supplying the anal muscles may also be injured. While some injuries may be recognized immediately following childbirth, many others may go unnoticed and not become a problem until later in life. In these situations, a prior childbirth may not be recognized as the cause of incontinence. Anal operations or traumatic injury to the tissue surrounding the anal region similarly can damage the anal muscles and hinder bowel control. Some individuals experience loss of strength in the anal muscles as they age. As a result, a minor control problem in a younger person may become more significant later in life. Diarrhea may be associated with a feeling of urgency or stool leakage due to the frequent liquid stools passing through the anal opening. If bleeding accompanies your bowel movements or you have lack of bowel control, consult your physician. These symptoms may indicate inflammation within the colon (colitis), a rectal tumor or rectal prolapse - all conditions that require prompt evaluation by a physician.    How is the cause of incontinence determined? An initial discussion of the problem with your physician will help establish the degree of control difficulty and its impact on your lifestyle. Many clues to the origin of incontinence may be found in patient histories. For example, a woman's history of past childbirths is very important. Multiple pregnancies, large weight babies, forceps deliveries, or episiotomies  may contribute to muscle or nerve injury at the time of childbirth. In some cases, medical illnesses and medications play a role in problems with control. A physical exam of the anal region should be performed. It may readily identify an obvious injury to the anal muscles. In addition, an ultrasound probe can be used within the anal area to provide a picture of the muscles and show areas in which the anal muscles have been injured. Frequently, additional studies are required to define the anal area more completely. In a test called anal manometry, a small catheter is placed into the anus to record pressure as patients relax and tighten the anal muscles. This test can demonstrate how weak or strong the muscle really is. A separate test may also be conducted to determine if the nerves that go to the anal muscles are functioning properly. What can be done to correct the problem? Treatment of incontinence may include: .     Dietary changes .     Constipating medications .     Muscle strengthening exercises .     Biofeedback  Injectable bulking agents  Surgical muscle repair  Artificial anal sphincter  Sacral nerve stimulator  After a careful history, physical examination and testing to determine the cause and severity of the problem, treatment can be addressed. Mild problems may be treated very simply with dietary changes and the use of some constipating medications. Diseases which cause inflammation in the rectum, such as colitis, may contribute to anal control problems. Treating these diseases also may eliminate or improve symptoms of incontinence. Sometimes a change in prescribed medications may help. Your physician also may recommend simple home exercises that may   strengthen the anal muscles to help in mild cases. A type of physical therapy called biofeedback can be used to help patients sense when stool is ready to be evacuated and help strengthen the muscles. Injuries to the anal muscles may be  repaired with surgery. Some individuals may benefit from a technique that delivers electrical energy to the skin and muscles surrounding the anus which results in firming and thickening of this area to help with continence. In certain individuals that have nerve damage or anal muscles that are damaged beyond repair, an artificial sphincter may be implanted. The artificial sphincter is a plastic, fluid filled doughnut that is surgically implanted around the damaged anal sphincter. This artificial sphincter keeps the anal canal closed. When an individual wants to have a bowel movement, the fluid can be pumped out of the doughnut to allow the anal canal to open. In extreme cases, patients may find that a colostomy is the best option for improving their quality of life. What is a colon and rectal surgeon? Colon and rectal surgeons are experts in the surgical and non-surgical treatment of diseases of the colon, rectum and anus. They have completed advanced surgical training in the treatment of these diseases as well as full general surgical training. Board-certified colon and rectal surgeons complete residencies in general surgery and colon and rectal surgery, and pass intensive examinations conducted by the American Board of Surgery and the American Board of Colon and Rectal Surgery. They are well-versed in the treatment of both benign and malignant diseases of the colon, rectum and anus and are able to perform routine screening examinations and surgically treat conditions if indicated to do so.  2012 American Society of Colon & Rectal Surgeons     Fiber Chart  You should 25-30g of fiber per day and drinking 8 glasses of water to help your bowels move regularly.  In the chart below you can look up how much fiber you are getting in an average day.  If you are not getting enough fiber, you should add a fiber supplement to your diet.  Examples of this include Metamucil, FiberCon and Citrucel.  These can be  purchased at your local grocery store or pharmacy.      http://www.[employer].edu/offices/health/nutritioncoach/AtoZ/handouts/Fiber.pdf   GETTING TO GOOD BOWEL HEALTH. Irregular bowel habits such as constipation can lead to many problems over time.  Having one soft bowel movement a day is the most important way to prevent further problems.  The anorectal canal is designed to handle stretching and feces to safely manage our ability to get rid of solid waste (feces, poop, stool) out of our body.  BUT, hard constipated stools can act like ripping concrete bricks causing inflamed hemorrhoids, anal fissures, abdominal pain and bloating.     The goal: ONE SOFT BOWEL MOVEMENT A DAY!  To have soft, regular bowel movements:    Drink at least 8 tall glasses of water a day.     Take plenty of fiber.  Fiber is the undigested part of plant food that passes into the colon, acting s "natures broom" to encourage bowel motility and movement.  Fiber can absorb and hold large amounts of water. This results in a larger, bulkier stool, which is soft and easier to pass. Work gradually over several weeks up to 6 servings a day of fiber (25g a day even more if needed) in the form of: o Vegetables -- Root (potatoes, carrots, turnips), leafy green (lettuce, salad greens, celery, spinach), or cooked high residue (  cabbage, broccoli, etc) o Fruit -- Fresh (unpeeled skin & pulp), Dried (prunes, apricots, cherries, etc ),  or stewed ( applesauce)  o Whole grain breads, pasta, etc (whole wheat)  o Bran cereals    Bulking Agents -- This type of water-retaining fiber generally is easily obtained each day by one of the following:  o Psyllium bran -- The psyllium plant is remarkable because its ground seeds can retain so much water. This product is available as Metamucil, Konsyl, Effersyllium, Per Diem Fiber, or the less expensive generic preparation in drug and health food stores. Although labeled a laxative, it really is not a laxative.   o Methylcellulose -- This is another fiber derived from wood which also retains water. It is available as Citrucel. o Polyethylene Glycol - and "artificial" fiber commonly called Miralax or Glycolax.  It is helpful for people with gassy or bloated feelings with regular fiber o Flax Seed - a less gassy fiber than psyllium  

## 2012-04-26 NOTE — Progress Notes (Signed)
No chief complaint on file.   HISTORY: Danielle Hernandez is a 77 y.o. female who presents to the office with fecal incontinience.  She has leakage intermittently, and at least once a week.  This got worse about 6 months.  She has incontinence to flatus and liquid stool but not solid.  She denies any urgency.  She mainly has leakage when she walks for exercise.  Her bowel habits are regular.  Her bowel movements are usually soft.  When she has diarrhea, her leakage is worse.  She denies any blood in her stools.  Her last colonoscopy was about 5-6 yrs ago by Dr Barnett Abu.  She was told she didn't need another for 10 years.   She has a history of 2 vaginal deliveries.  She is also having more urinary incontinence and is currently undergoing workup with Dr McDiarmid.  Past Medical History  Diagnosis Date  . Headache   . Arthritis   . Osteoporosis   . Raynaud disease       Past Surgical History  Procedure Laterality Date  . Abdominal hysterectomy    . Wrist ganglion excision      right hand  . Under chin cyst removed    . Tonsillectomy    . Back surgery          Current Outpatient Prescriptions  Medication Sig Dispense Refill  . Calcium Carbonate-Vitamin D (CALCIUM + D PO) Take 1 tablet by mouth 2 (two) times daily.        Marland Kitchen estrogens, conjugated, (PREMARIN) 0.625 MG tablet Take 0.625 mg by mouth every other day. Take daily for 21 days then do not take for 7 days.       Marland Kitchen gabapentin (NEURONTIN) 300 MG capsule Take 300 mg by mouth 3 (three) times daily.      . Multiple Vitamins-Minerals (MULTIVITAMINS THER. W/MINERALS) TABS Take 1 tablet by mouth daily.        Marland Kitchen VITAMIN E PO Take 1 tablet by mouth daily.        Marland Kitchen alendronate (FOSAMAX) 70 MG tablet Take 70 mg by mouth every 7 (seven) days. Tuesday. Take with a full glass of water on an empty stomach.       . nabumetone (RELAFEN) 500 MG tablet Take 500 mg by mouth 2 (two) times daily.       No current facility-administered medications for this  visit.      Allergies  Allergen Reactions  . Penicillins Other (See Comments)    Unknown   PT STATES SHE  IS ALLERGIC TO SULFA AND SOMEONE TOLD HER IF YOU ARE ALLERGIC TO SULFA YOU ARE ALLERGIC TO PENICILLIN  . Sulfa Drugs Cross Reactors Rash      Family History  Problem Relation Age of Onset  . Diabetes Mother   . Heart disease Father     History   Social History  . Marital Status: Widowed    Spouse Name: N/A    Number of Children: N/A  . Years of Education: N/A   Social History Main Topics  . Smoking status: Never Smoker   . Smokeless tobacco: None  . Alcohol Use: No  . Drug Use: No  . Sexually Active:    Other Topics Concern  . None   Social History Narrative  . None      REVIEW OF SYSTEMS - PERTINENT POSITIVES ONLY: Review of Systems - General ROS: negative for - chills, fever or weight loss Hematological and Lymphatic ROS: negative  for - bleeding problems, blood clots or bruising Respiratory ROS: no cough, shortness of breath, or wheezing Cardiovascular ROS: no chest pain or dyspnea on exertion Gastrointestinal ROS: positive for - vague lower abd pain negative for - blood in stools, constipation or nausea/vomiting Genito-Urinary ROS: no dysuria, trouble voiding, or hematuria  EXAM: Filed Vitals:   04/26/12 0905  BP: 160/84  Pulse: 72  Temp: 97.9 F (36.6 C)  Resp: 14    General appearance: alert and cooperative Resp: clear to auscultation bilaterally Cardio: regular rate and rhythm GI: soft, non-tender; bowel sounds normal; no masses,  no organomegaly   Procedure: Anoscopy Surgeon: Maisie Fus Diagnosis: fecal incontinence   Assistant: Christella Scheuermann After the risks and benefits were explained, verbal consent was obtained for above procedure  Anesthesia: none Findings: small rectocele, good resting tone, minimal to no squeeze pressure    ASSESSMENT AND PLAN: Danielle Hernandez is a 77 y.o. F with mild to moderate fecal incontinence.  She will start adding  in a fiber supplement to her diet.  I will see her back in 8 weeks to evaluate her progress.  At that point, if her symptoms are not better, we will get her set up for anal manometry.  She is a possible candidate for solesta injections as well.    Vanita Panda, MD Colon and Rectal Surgery / General Surgery [facility]      Visit Diagnoses: 1. Fecal incontinence     Primary Care Physician: Lupita Raider, MD

## 2012-06-20 ENCOUNTER — Encounter (INDEPENDENT_AMBULATORY_CARE_PROVIDER_SITE_OTHER): Payer: Self-pay | Admitting: General Surgery

## 2012-06-20 ENCOUNTER — Ambulatory Visit (INDEPENDENT_AMBULATORY_CARE_PROVIDER_SITE_OTHER): Payer: Medicare Other | Admitting: General Surgery

## 2012-06-20 VITALS — BP 120/70 | HR 72 | Temp 97.0°F | Resp 12 | Wt 117.0 lb

## 2012-06-20 DIAGNOSIS — R159 Full incontinence of feces: Secondary | ICD-10-CM

## 2012-06-20 NOTE — Patient Instructions (Signed)
Continue fiber.  Call the office if your symptoms get worse

## 2012-06-20 NOTE — Progress Notes (Signed)
Danielle Hernandez is a 77 y.o. female who is here for a follow up visit regarding her fecal incontinence.  This has been much better since starting the fiber therapy.  She is also trying vesicare per Dr MacDiarmid's recommendations for her urinary incontinence.    Objective: Filed Vitals:   06/20/12 1208  BP: 120/70  Pulse: 72  Temp: 97 F (36.1 C)  Resp: 12    General appearance: alert and cooperative   Assessment and Plan: LEYAH BOCCHINO is doing well.  She does not wish to pursue further treatment at this time, as the fiber is helping quite a bit.  She will call the office if her symptoms get worse.    Vanita Panda, MD [facility] [place] [phone]

## 2012-07-06 ENCOUNTER — Other Ambulatory Visit: Payer: Self-pay | Admitting: Neurosurgery

## 2012-07-06 DIAGNOSIS — M545 Low back pain, unspecified: Secondary | ICD-10-CM

## 2012-07-06 DIAGNOSIS — M549 Dorsalgia, unspecified: Secondary | ICD-10-CM

## 2012-07-11 ENCOUNTER — Other Ambulatory Visit: Payer: Self-pay | Admitting: Family Medicine

## 2012-07-11 DIAGNOSIS — R109 Unspecified abdominal pain: Secondary | ICD-10-CM

## 2012-07-14 ENCOUNTER — Ambulatory Visit
Admission: RE | Admit: 2012-07-14 | Discharge: 2012-07-14 | Disposition: A | Discharge status: Home / Self Care | Payer: Medicare Other | Source: Ambulatory Visit | Attending: Family Medicine | Admitting: Family Medicine

## 2012-07-14 ENCOUNTER — Other Ambulatory Visit: Payer: Self-pay | Admitting: Family Medicine

## 2012-07-14 DIAGNOSIS — R109 Unspecified abdominal pain: Secondary | ICD-10-CM

## 2012-07-17 ENCOUNTER — Ambulatory Visit
Admission: RE | Admit: 2012-07-17 | Discharge: 2012-07-17 | Disposition: A | Discharge status: Home / Self Care | Payer: Medicare Other | Source: Ambulatory Visit | Attending: Neurosurgery | Admitting: Neurosurgery

## 2012-07-17 DIAGNOSIS — M549 Dorsalgia, unspecified: Secondary | ICD-10-CM

## 2012-07-17 DIAGNOSIS — M545 Low back pain, unspecified: Secondary | ICD-10-CM

## 2012-07-17 MED ORDER — GADOBENATE DIMEGLUMINE 529 MG/ML IV SOLN
10.0000 mL | Freq: Once | INTRAVENOUS | Status: AC | PRN
  Administered 2012-07-17: 10 mL via INTRAVENOUS

## 2012-07-24 ENCOUNTER — Other Ambulatory Visit (HOSPITAL_COMMUNITY): Payer: Self-pay | Admitting: Family Medicine

## 2012-07-24 ENCOUNTER — Other Ambulatory Visit: Payer: Self-pay | Admitting: Radiology

## 2012-07-24 DIAGNOSIS — K6812 Psoas muscle abscess: Secondary | ICD-10-CM

## 2012-07-25 ENCOUNTER — Encounter (HOSPITAL_COMMUNITY): Payer: Self-pay | Admitting: Pharmacy Technician

## 2012-07-25 ENCOUNTER — Other Ambulatory Visit (HOSPITAL_COMMUNITY): Payer: Self-pay | Admitting: Family Medicine

## 2012-07-25 ENCOUNTER — Ambulatory Visit (HOSPITAL_COMMUNITY)
Admission: RE | Admit: 2012-07-25 | Discharge: 2012-07-25 | Disposition: A | Discharge status: Home / Self Care | Payer: Medicare Other | Source: Ambulatory Visit | Attending: Family Medicine | Admitting: Family Medicine

## 2012-07-25 VITALS — BP 126/58 | HR 80 | Temp 97.8°F | Resp 18 | Ht 60.0 in | Wt 117.0 lb

## 2012-07-25 DIAGNOSIS — K6812 Psoas muscle abscess: Secondary | ICD-10-CM

## 2012-07-25 DIAGNOSIS — I7300 Raynaud's syndrome without gangrene: Secondary | ICD-10-CM | POA: Insufficient documentation

## 2012-07-25 DIAGNOSIS — Y838 Other surgical procedures as the cause of abnormal reaction of the patient, or of later complication, without mention of misadventure at the time of the procedure: Secondary | ICD-10-CM | POA: Insufficient documentation

## 2012-07-25 DIAGNOSIS — IMO0002 Reserved for concepts with insufficient information to code with codable children: Secondary | ICD-10-CM | POA: Insufficient documentation

## 2012-07-25 DIAGNOSIS — M810 Age-related osteoporosis without current pathological fracture: Secondary | ICD-10-CM | POA: Insufficient documentation

## 2012-07-25 DIAGNOSIS — M129 Arthropathy, unspecified: Secondary | ICD-10-CM | POA: Insufficient documentation

## 2012-07-25 LAB — PROTIME-INR: Prothrombin Time: 11.6 seconds (ref 11.6–15.2)

## 2012-07-25 LAB — CBC
MCH: 32.9 pg (ref 26.0–34.0)
MCHC: 33.4 g/dL (ref 30.0–36.0)
Platelets: 248 10*3/uL (ref 150–400)
RBC: 3.92 MIL/uL (ref 3.87–5.11)
RDW: 13 % (ref 11.5–15.5)

## 2012-07-25 MED ORDER — SODIUM CHLORIDE 0.9 % IV SOLN
INTRAVENOUS | Status: AC | PRN
  Administered 2012-07-25: 30 mL via INTRAVENOUS

## 2012-07-25 MED ORDER — FENTANYL CITRATE 0.05 MG/ML IJ SOLN
INTRAMUSCULAR | Status: AC
  Filled 2012-07-25: qty 4

## 2012-07-25 MED ORDER — MIDAZOLAM HCL 2 MG/2ML IJ SOLN
INTRAMUSCULAR | Status: AC
  Filled 2012-07-25: qty 4

## 2012-07-25 MED ORDER — MIDAZOLAM HCL 2 MG/2ML IJ SOLN
INTRAMUSCULAR | Status: AC
  Filled 2012-07-25: qty 2

## 2012-07-25 MED ORDER — MIDAZOLAM HCL 2 MG/2ML IJ SOLN
INTRAMUSCULAR | Status: AC | PRN
  Administered 2012-07-25 (×2): 1 mg via INTRAVENOUS

## 2012-07-25 MED ORDER — FENTANYL CITRATE 0.05 MG/ML IJ SOLN
INTRAMUSCULAR | Status: AC
  Filled 2012-07-25: qty 2

## 2012-07-25 MED ORDER — SODIUM CHLORIDE 0.9 % IV SOLN: Freq: Once | INTRAVENOUS | Status: DC

## 2012-07-25 MED ORDER — FENTANYL CITRATE 0.05 MG/ML IJ SOLN
INTRAMUSCULAR | Status: AC | PRN
  Administered 2012-07-25 (×2): 25 ug via INTRAVENOUS

## 2012-07-25 NOTE — H&P (Signed)
Agree with PA note.    Signed,  Tyyne Cliett K. Blythe Hartshorn, MD Vascular & Interventional Radiologist [facility]  

## 2012-07-25 NOTE — ED Notes (Signed)
O2 2L/[place] started 

## 2012-07-25 NOTE — Procedures (Signed)
Interventional Radiology Procedure Note  Procedure: CT guided aspiration of small left psoas fluid collection. Complications:  None Recommendations: - Bedrest x 1 hour - Cultures pending  Signed,  Sterling Big, MD Vascular & Interventional Radiologist [facility]

## 2012-07-25 NOTE — H&P (Signed)
Danielle Hernandez is an 77 y.o. female.   Chief Complaint: low back pain HPI: Patient with history of low back pain (left >right), prior lumbar fusion 2013 and recent MRI revealing a left psoas fluid collection. She presents today for CT guided aspiration/possible drainage of the psoas fluid collection.  Past Medical History  Diagnosis Date  . Headache(784.0)   . Arthritis   . Osteoporosis   . Raynaud disease     Past Surgical History  Procedure Laterality Date  . Abdominal hysterectomy    . Wrist ganglion excision      right hand  . Under chin cyst removed    . Tonsillectomy    . Back surgery      Family History  Problem Relation Age of Onset  . Diabetes Mother   . Heart disease Father    Social History:  reports that she has never smoked. She does not have any smokeless tobacco history on file. She reports that she does not drink alcohol or use illicit drugs.  Allergies:  Allergies  Allergen Reactions  . Penicillins Other (See Comments)    Unknown   PT STATES SHE  IS ALLERGIC TO SULFA AND SOMEONE TOLD HER IF YOU ARE ALLERGIC TO SULFA YOU ARE ALLERGIC TO PENICILLIN  . Sulfa Drugs Cross Reactors Rash   Current outpatient prescriptions:Calcium Carbonate-Vitamin D (CALCIUM + D PO), Take 1 tablet by mouth 2 (two) times daily.  , Disp: , Rfl: ;  estrogens, conjugated, (PREMARIN) 0.625 MG tablet, Take 0.625 mg by mouth every other day. Take daily for 21 days then do not take for 7 days. , Disp: , Rfl: ;  gabapentin (NEURONTIN) 300 MG capsule, Take 300 mg by mouth 3 (three) times daily., Disp: , Rfl:  Multiple Vitamins-Minerals (MULTIVITAMINS THER. W/MINERALS) TABS, Take 1 tablet by mouth daily.  , Disp: , Rfl:  Current facility-administered medications:0.9 %  sodium chloride infusion, , Intravenous, Once, Robet Leu, PA-C   Results for orders placed during the hospital encounter of 02/11/11  CBC      Result Value Range   WBC 8.7  4.0 - 10.5 K/uL   RBC 2.79 (*) 3.87 - 5.11  MIL/uL   Hemoglobin 9.4 (*) 12.0 - 15.0 g/dL   HCT 16.1 (*) 09.6 - 04.5 %   MCV 98.9  78.0 - 100.0 fL   MCH 33.7  26.0 - 34.0 pg   MCHC 34.1  30.0 - 36.0 g/dL   RDW 40.9  81.1 - 91.4 %   Platelets 215  150 - 400 K/uL  BASIC METABOLIC PANEL      Result Value Range   Sodium 137  135 - 145 mEq/L   Potassium 3.6  3.5 - 5.1 mEq/L   Chloride 101  96 - 112 mEq/L   CO2 29  19 - 32 mEq/L   Glucose, Bld 110 (*) 70 - 99 mg/dL   BUN 17  6 - 23 mg/dL   Creatinine, Ser 7.82  0.50 - 1.10 mg/dL   Calcium 9.3  8.4 - 95.6 mg/dL   GFR calc non Af Amer 86 (*) >90 mL/min   GFR calc Af Amer >90  >90 mL/min  PROTIME-INR      Result Value Range   Prothrombin Time 13.5  11.6 - 15.2 seconds   INR 1.01  0.00 - 1.49  APTT      Result Value Range   aPTT 35  24 - 37 seconds   07/25/12 labs pending Review  of Systems  Constitutional: Negative for fever and chills.  HENT:       Occ HA's  Respiratory: Negative for cough and shortness of breath.   Cardiovascular: Negative for chest pain.  Gastrointestinal: Positive for abdominal pain. Negative for nausea and vomiting.  Musculoskeletal: Positive for back pain.    Blood pressure 146/72, pulse 81, temperature 97.8 F (36.6 C), temperature source Oral, resp. rate 20, height 5' (1.524 m), weight 117 lb (53.071 kg), SpO2 99.00%. Physical Exam  Constitutional: She is oriented to person, place, and time. She appears well-developed and well-nourished.  Cardiovascular: Normal rate and regular rhythm.   Respiratory: Effort normal and breath sounds normal.  GI: Soft. Bowel sounds are normal.  Mild tenderness left abd secondary to referred pain from left flank  Musculoskeletal: Normal range of motion. She exhibits no edema.  Neurological: She is alert and oriented to person, place, and time.     Assessment/Plan Pt with hx low back pain, prior lumbar fusion 2013 and recent MRI demonstrating a left psoas fluid collection. Plan is for CT guided aspiration/possible  drainage of collection today. Details/risks of procedure d/w pt /daughter with their understanding and consent.  ALLRED,D KEVIN 07/25/2012, 10:25 AM

## 2012-07-28 LAB — CULTURE, ROUTINE-ABSCESS

## 2012-10-21 IMAGING — CT CT L SPINE W/O CM
2 of 10 series · 5 of 20 positions shown, 6 images · non-contrast
Comparison: Intraoperative views of the lumbar spine 02/08/2011.

CLINICAL DATA: Back and left leg pain.  The patient is status post
lumbar surgery 02/08/2011.

CT LUMBAR SPINE WITHOUT CONTRAST
TECHNIQUE: Multidetector CT imaging of the lumbar spine was
performed without intravenous contrast administration. Multiplanar
CT image reconstructions were also generated.

[Series 2: l spine bone · axial · 0.27mm/px · z∈[-185,-108]mm · 2 of 93 slices shown, 3 images]
[im 31/93  soft-tissue]
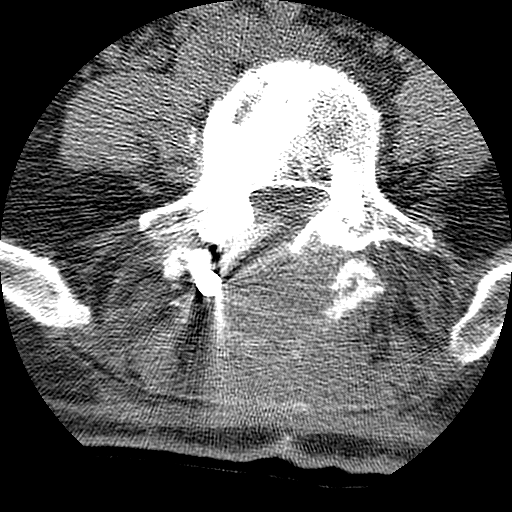
[im 31/93  bone]
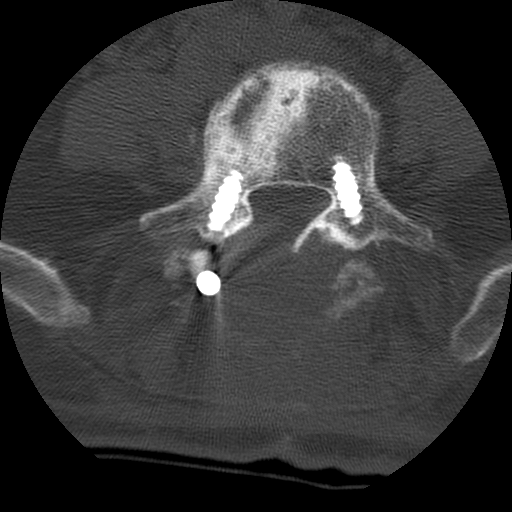
[im 62/93  bone]
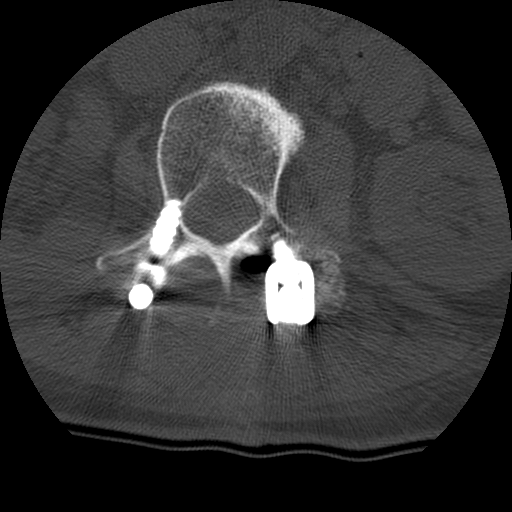

[Series 3: l spine soft · axial · 0.27mm/px · z∈[-202,-88]mm · 3 of 93 slices shown]
[im 24/93  soft-tissue]
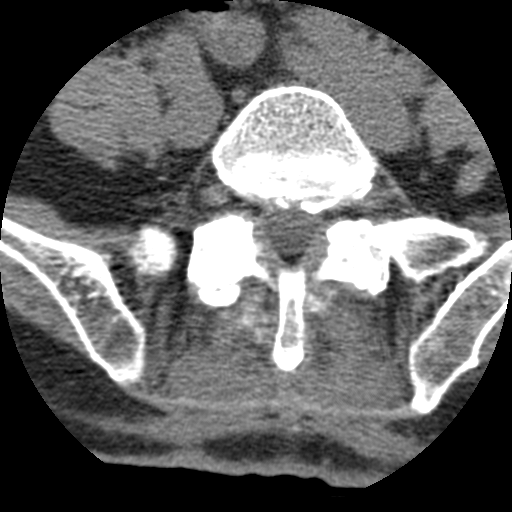
[im 47/93  soft-tissue]
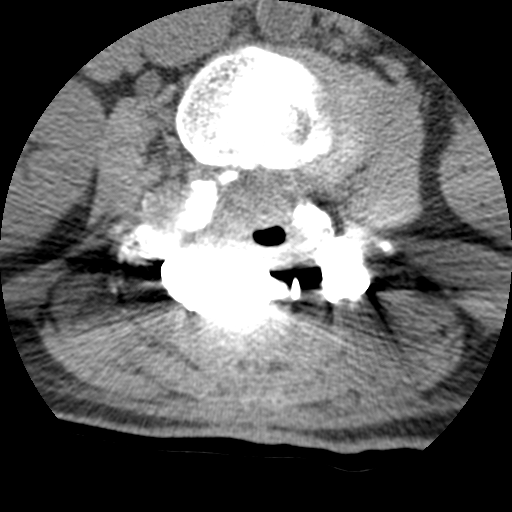
[im 70/93  soft-tissue]
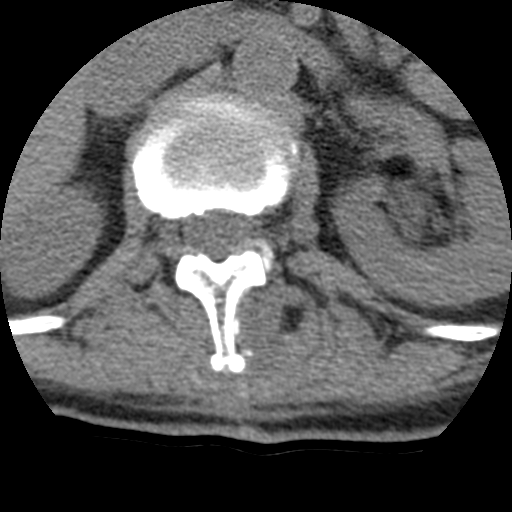

[5 of 20 positions shown; findings below may reference images not displayed]

FINDINGS: Vertebral body height is maintained.  There is convex
left scoliosis.  Endplate sclerosis is identified and is greatest
at L2-3 and L4-5.

The patient is status post L2-5 laminectomy and fusion with pedicle
screws stabilization bars in place.  The right pedicle screw in L2
just penetrates the superior endplate of the vertebral body.  The
left pedicle screw just penetrates the medial aspect of the cortex
of the pedicle.  Hardware positioning is otherwise unremarkable.
There is no lucency about the pedicle screws.  Stabilization bars
are intact.  Bone graft material about the posterior elements shows
some early coalescence.  Intervertebral spacers appear well
positioned and there appears to be some early incorporation into
the end plates at the fusion levels.

There is some degenerative change about the sacroiliac joints.
Imaged paraspinous structures are unremarkable.

T11-12:  There is some facet degenerative disease.  No disc bulge
or protrusion is identified.  Central canal foramina appear open.

L1-2:  Mild appearing facet degenerative change.  No disc bulge or
protrusion.  Central canal and foramina appear open.

L2-3:  Status post posterior decompression.  There is some air in
the laminectomy bed, intervertebral disc space and left lateral
recess compatible with postoperative change.  No fluid collection
is identified.  Central canal and foramina appear open.

L3-4:  The patient is status post posterior decompression.  A small
amount of air in the laminectomy bed.  Central canal is open.
Small calcification in the right lateral recess and foramen may be
due to residual disc/endplate spur.  There is facet arthropathy on
the right with some right foraminal narrowing identified.

L4-5:  Wide laminectomy defect is identified.  Central canal and
foramina appear open.

L5-S1:  There is facet arthropathy and mild disc bulging with
ligamentum flavum thickening.  The central canal appears open.
Foramina are mildly narrowed.
IMPRESSION: 1.  Status post L2-5 laminectomy and fusion.  As noted above, right
pedicle screw in L2 just penetrates superior endplate of vertebral
body and the left pedicle screw just penetrates the medial cortex
of the pedicle.  Posterior bone graft material and interbody
spacers show early incorporation.
2.  Collections of gas posteriorly at L2-3 and L3-4 are likely due
to postoperative change.  No fluid collection is identified.
3.  Wide decompression of the central canal at all of the operative
levels.  There is some foraminal narrowing on the right at L3-4 due
to facet arthropathy and endplate spur. No finding to explain left
leg pain is identified.

## 2013-02-21 ENCOUNTER — Other Ambulatory Visit: Payer: Self-pay | Admitting: Gastroenterology

## 2013-02-21 DIAGNOSIS — Z1211 Encounter for screening for malignant neoplasm of colon: Secondary | ICD-10-CM

## 2013-03-06 ENCOUNTER — Inpatient Hospital Stay
Admission: RE | Admit: 2013-03-06 | Discharge: 2013-03-06 | Disposition: A | Discharge status: Home / Self Care | Payer: Medicare Other | Source: Ambulatory Visit | Attending: Gastroenterology | Admitting: Gastroenterology

## 2013-03-13 ENCOUNTER — Other Ambulatory Visit: Payer: Self-pay | Admitting: Gastroenterology

## 2013-03-13 DIAGNOSIS — D126 Benign neoplasm of colon, unspecified: Secondary | ICD-10-CM

## 2013-03-20 ENCOUNTER — Ambulatory Visit
Admission: RE | Admit: 2013-03-20 | Discharge: 2013-03-20 | Disposition: A | Discharge status: Home / Self Care | Payer: Medicare Other | Source: Ambulatory Visit | Attending: Gastroenterology | Admitting: Gastroenterology

## 2013-03-20 DIAGNOSIS — D126 Benign neoplasm of colon, unspecified: Secondary | ICD-10-CM

## 2013-07-31 ENCOUNTER — Ambulatory Visit
Admission: RE | Admit: 2013-07-31 | Discharge: 2013-07-31 | Disposition: A | Discharge status: Home / Self Care | Payer: Medicare Other | Source: Ambulatory Visit | Attending: Family Medicine | Admitting: Family Medicine

## 2013-07-31 ENCOUNTER — Other Ambulatory Visit: Payer: Self-pay | Admitting: Family Medicine

## 2013-07-31 DIAGNOSIS — R52 Pain, unspecified: Secondary | ICD-10-CM

## 2013-08-01 ENCOUNTER — Other Ambulatory Visit: Payer: Self-pay | Admitting: Family Medicine

## 2013-08-01 DIAGNOSIS — R221 Localized swelling, mass and lump, neck: Secondary | ICD-10-CM

## 2013-08-08 ENCOUNTER — Other Ambulatory Visit: Payer: Medicare Other

## 2013-08-10 ENCOUNTER — Ambulatory Visit
Admission: RE | Admit: 2013-08-10 | Discharge: 2013-08-10 | Disposition: A | Discharge status: Home / Self Care | Payer: Medicare Other | Source: Ambulatory Visit | Attending: Family Medicine | Admitting: Family Medicine

## 2013-08-10 DIAGNOSIS — R221 Localized swelling, mass and lump, neck: Secondary | ICD-10-CM

## 2015-08-19 ENCOUNTER — Ambulatory Visit
Admission: RE | Admit: 2015-08-19 | Discharge: 2015-08-19 | Disposition: A | Discharge status: Home / Self Care | Payer: Medicare Other | Source: Ambulatory Visit | Attending: Family Medicine | Admitting: Family Medicine

## 2015-08-19 ENCOUNTER — Other Ambulatory Visit: Payer: Self-pay | Admitting: Family Medicine

## 2015-08-19 DIAGNOSIS — W19XXXA Unspecified fall, initial encounter: Secondary | ICD-10-CM

## 2016-03-04 DIAGNOSIS — H6122 Impacted cerumen, left ear: Secondary | ICD-10-CM | POA: Insufficient documentation

## 2016-08-19 ENCOUNTER — Other Ambulatory Visit: Payer: Self-pay | Admitting: Family Medicine

## 2016-08-19 DIAGNOSIS — M79605 Pain in left leg: Secondary | ICD-10-CM

## 2016-08-20 ENCOUNTER — Ambulatory Visit
Admission: RE | Admit: 2016-08-20 | Discharge: 2016-08-20 | Disposition: A | Discharge status: Home / Self Care | Payer: Medicare Other | Source: Ambulatory Visit | Attending: Family Medicine | Admitting: Family Medicine

## 2016-08-20 DIAGNOSIS — M79605 Pain in left leg: Secondary | ICD-10-CM

## 2016-09-20 ENCOUNTER — Other Ambulatory Visit: Payer: Self-pay | Admitting: Physician Assistant

## 2016-09-20 ENCOUNTER — Ambulatory Visit
Admission: RE | Admit: 2016-09-20 | Discharge: 2016-09-20 | Disposition: A | Discharge status: Home / Self Care | Payer: Medicare Other | Source: Ambulatory Visit | Attending: Physician Assistant | Admitting: Physician Assistant

## 2016-09-20 DIAGNOSIS — M542 Cervicalgia: Secondary | ICD-10-CM

## 2017-07-25 ENCOUNTER — Ambulatory Visit (HOSPITAL_COMMUNITY): Admission: RE | Admit: 2017-07-25 | Payer: Medicare Other | Source: Ambulatory Visit

## 2017-07-25 ENCOUNTER — Other Ambulatory Visit: Payer: Self-pay | Admitting: Physician Assistant

## 2017-07-25 ENCOUNTER — Ambulatory Visit
Admission: RE | Admit: 2017-07-25 | Discharge: 2017-07-25 | Disposition: A | Discharge status: Home / Self Care | Payer: Medicare Other | Source: Ambulatory Visit | Attending: Physician Assistant | Admitting: Physician Assistant

## 2017-07-25 DIAGNOSIS — S0990XA Unspecified injury of head, initial encounter: Secondary | ICD-10-CM

## 2017-07-26 ENCOUNTER — Other Ambulatory Visit: Payer: Self-pay | Admitting: Physician Assistant

## 2017-07-26 DIAGNOSIS — R55 Syncope and collapse: Secondary | ICD-10-CM

## 2017-07-29 ENCOUNTER — Ambulatory Visit
Admission: RE | Admit: 2017-07-29 | Discharge: 2017-07-29 | Disposition: A | Discharge status: Home / Self Care | Payer: Medicare Other | Source: Ambulatory Visit | Attending: Physician Assistant | Admitting: Physician Assistant

## 2017-07-29 DIAGNOSIS — R55 Syncope and collapse: Secondary | ICD-10-CM

## 2017-07-29 MED ORDER — GADOBENATE DIMEGLUMINE 529 MG/ML IV SOLN
11.0000 mL | Freq: Once | INTRAVENOUS | Status: AC | PRN
  Administered 2017-07-29: 11 mL via INTRAVENOUS

## 2017-12-09 ENCOUNTER — Other Ambulatory Visit: Payer: Self-pay | Admitting: Family Medicine

## 2017-12-09 DIAGNOSIS — R55 Syncope and collapse: Secondary | ICD-10-CM

## 2017-12-14 ENCOUNTER — Ambulatory Visit
Admission: RE | Admit: 2017-12-14 | Discharge: 2017-12-14 | Disposition: A | Discharge status: Home / Self Care | Payer: Medicare Other | Source: Ambulatory Visit | Attending: Family Medicine | Admitting: Family Medicine

## 2017-12-14 DIAGNOSIS — R55 Syncope and collapse: Secondary | ICD-10-CM

## 2018-02-21 DIAGNOSIS — H61001 Unspecified perichondritis of right external ear: Secondary | ICD-10-CM | POA: Insufficient documentation

## 2018-03-02 ENCOUNTER — Ambulatory Visit
Admission: RE | Admit: 2018-03-02 | Discharge: 2018-03-02 | Disposition: A | Discharge status: Home / Self Care | Payer: Medicare Other | Source: Ambulatory Visit | Attending: Family Medicine | Admitting: Family Medicine

## 2018-03-02 ENCOUNTER — Other Ambulatory Visit: Payer: Self-pay | Admitting: Family Medicine

## 2018-03-02 DIAGNOSIS — R0600 Dyspnea, unspecified: Secondary | ICD-10-CM

## 2018-03-03 ENCOUNTER — Encounter: Payer: Self-pay | Admitting: *Deleted

## 2018-03-05 NOTE — Progress Notes (Signed)
Cardiology Office Note   Date:  03/06/2018   ID:  Danielle, Hernandez 17-May-1935, MRN [id number]  PCP:  Mayra Neer, MD  Cardiologist:   No primary care provider on file. Referring:  Mayra Neer, MD  Chief Complaint  Patient presents with  . Loss of Consciousness      History of Present Illness: Danielle Hernandez is a 83 y.o. female who is referred  Mayra Neer, MD for evaluation of syncope.  The patient has had a few episodes of syncope over the years.  About 3 years ago she reached up in her cabinet to get something to feed the dog and she apparently passed out actually falling on the dog.  She said she was only out for a few seconds.  More recently in July of last year she had an episode when she was up at night sitting on the toilet and she apparently fell and had some trauma at that time but she did not seek any medical attention.  She said she lost consciousness.  The most recent time was on 29 January.  She was volunteering at church.  She felt some discomfort around her abdomen and then to have presyncope and had to be placed in a chair.  She did not lose consciousness at that time.  She does not feel palpitations.  She does get some orthostatic symptoms and has to stand up slowly.  She is recently been treated for a new diagnosis of hypertension.  She describes no chest pressure, neck or arm discomfort.  She has no new shortness of breath, PND or orthopnea.  She gets tired more easily than she used to.  She does get some arm tiredness when she is reaching above her head like doing her hair.   Past Medical History:  Diagnosis Date  . Arthritis   . Headache(784.0)   . HTN (hypertension)   . Osteoporosis   . Raynaud disease     Past Surgical History:  Procedure Laterality Date  . ABDOMINAL HYSTERECTOMY    . BACK SURGERY    . TONSILLECTOMY    . under chin cyst removed    . WRIST GANGLION EXCISION     right hand     Current Outpatient Medications  Medication Sig  Dispense Refill  . Calcium Carbonate-Vitamin D (CALCIUM + D PO) Take 1 tablet by mouth 2 (two) times daily.      Marland Kitchen estrogens, conjugated, (PREMARIN) 0.625 MG tablet Take 0.625 mg by mouth every other day. Take daily for 21 days then do not take for 7 days.     Marland Kitchen gabapentin (NEURONTIN) 300 MG capsule Take 300 mg by mouth 3 (three) times daily.    Marland Kitchen lisinopril (PRINIVIL,ZESTRIL) 10 MG tablet Take 1 tablet by mouth daily.    . Multiple Vitamins-Minerals (MULTIVITAMINS THER. W/MINERALS) TABS Take 1 tablet by mouth daily.       No current facility-administered medications for this visit.     Allergies:   Penicillins; Sulfa antibiotics; and Sulfa drugs cross reactors    Social History:  The patient  reports that she has never smoked. She has never used smokeless tobacco. She reports that she does not drink alcohol or use drugs.   Family History:  The patient's family history includes Arthritis in her mother; Clotting disorder in her son; Diabetes in her mother; Heart disease (age of onset: 66) in her father; Stroke in her father.    ROS:  Please see the history  of present illness.   Otherwise, review of systems are positive for none.   All other systems are reviewed and negative.    PHYSICAL EXAM: VS:  BP 134/60   Pulse 68   Ht 4\' 9"  (1.448 m)   Wt 114 lb 6.4 oz (51.9 kg)   BMI 24.76 kg/m  , BMI Body mass index is 24.76 kg/m. GENERAL:  Well appearing HEENT:  Pupils equal round and reactive, fundi not visualized, oral mucosa unremarkable NECK:  No jugular venous distention, waveform within normal limits, carotid upstroke brisk and symmetric, no bruits, no thyromegaly LYMPHATICS:  No cervical, inguinal adenopathy LUNGS:  Clear to auscultation bilaterally BACK:  No CVA tenderness CHEST:  Unremarkable HEART:  PMI not displaced or sustained,S1 and S2 within normal limits, no S3, no S4, no clicks, no rubs, no murmurs ABD:  Flat, positive bowel sounds normal in frequency in pitch, no bruits,  no rebound, no guarding, no midline pulsatile mass, no hepatomegaly, no splenomegaly EXT:  2 plus pulses throughout, no edema, no cyanosis no clubbing SKIN:  No rashes no nodules NEURO:  Cranial nerves II through XII grossly intact, motor grossly intact throughout PSYCH:  Cognitively intact, oriented to person place and time    EKG:  EKG is ordered today. The ekg ordered today demonstrates sinus rhythm, rate 68, axis within normal limits, intervals within normal limits, no acute ST-T wave changes.   Recent Labs: No results found for requested labs within last 8760 hours.    Lipid Panel No results found for: CHOL, TRIG, HDL, CHOLHDL, VLDL, LDLCALC, LDLDIRECT    Wt Readings from Last 3 Encounters:  03/06/18 114 lb 6.4 oz (51.9 kg)  07/25/12 117 lb (53.1 kg)  06/20/12 117 lb (53.1 kg)      Other studies Reviewed: Additional studies/ records that were reviewed today include: Office records. Review of the above records demonstrates:  Please see elsewhere in the note.     ASSESSMENT AND PLAN:  SYNCOPE:   The patient's had 3 episodes of syncope over the last few years with 2 of them being in the last 12 months.  However, they do not sound like they have a common pattern.  She does have a slight blood pressure drop but rebounded to minutes.  At this point I do not strongly suspect a unifying cardiac diagnosis although I am going to screen her for arrhythmias with a ZIO patch.  In addition she will get a TSH.  We talked about orthostatic hypotension and avoiding sudden changes in posture.  I have reviewed office records and other labs are unremarkable.  She is not anemic.  I would not suspect significant vascular disease as an etiology.  She does have a slight differential blood pressure in both arms.  Further evaluation will be based on the results of the above testing or recurrent episodes.   Current medicines are reviewed at length with the patient today.  The patient does not have  concerns regarding medicines.  The following changes have been made:  no change  Labs/ tests ordered today include:   Orders Placed This Encounter  Procedures  . TSH  . LONG TERM MONITOR (3-14 DAYS)  . EKG 12-Lead     Disposition:   FU with me based on the results of the above.    Signed, Minus Breeding, MD  03/06/2018 10:53 AM    [facility]

## 2018-03-06 ENCOUNTER — Ambulatory Visit: Payer: Medicare Other | Admitting: Cardiology

## 2018-03-06 ENCOUNTER — Encounter: Payer: Self-pay | Admitting: Cardiology

## 2018-03-06 VITALS — BP 134/60 | HR 68 | Ht <= 58 in | Wt 114.4 lb

## 2018-03-06 DIAGNOSIS — R5383 Other fatigue: Secondary | ICD-10-CM

## 2018-03-06 DIAGNOSIS — R55 Syncope and collapse: Secondary | ICD-10-CM

## 2018-03-06 NOTE — Patient Instructions (Addendum)
Medication Instructions:  Continue current medications  If you need a refill on your cardiac medications before your next appointment, please call your pharmacy.  Labwork: TSH today  Take the provided lab slips with you to the lab for your blood draw.   When you have your labs (blood work) drawn today and your tests are completely normal, you will receive your results only by MyChart Message (if you have MyChart) -OR-  A paper copy in the mail.  If you have any lab test that is abnormal or we need to change your treatment, we will call you to review these results.  Testing/Procedures: Your physician has recommended that you wear an Zio Patch monitor 2 weeks. Event monitors are medical devices that record the heart's electrical activity. Doctors most often [place] these monitors to diagnose arrhythmias. Arrhythmias are problems with the speed or rhythm of the heartbeat. The monitor is a small, portable device. You can wear one while you do your normal daily activities. This is usually used to diagnose what is causing palpitations/syncope (passing out).  Follow-Up: . Your physician recommends that you schedule a follow-up appointment in: As Needed   At [facility], you and your health needs are our priority.  As part of our continuing mission to provide you with exceptional heart care, we have created designated Provider Care Teams.  These Care Teams include your primary Cardiologist (physician) and Advanced Practice Providers (APPs -  Physician Assistants and Nurse Practitioners) who all work together to provide you with the care you need, when you need it.   Thank you for choosing [facility] at [facility]!!

## 2018-03-07 LAB — TSH: TSH: 0.727 u[IU]/mL (ref 0.450–4.500)

## 2018-03-16 ENCOUNTER — Ambulatory Visit (INDEPENDENT_AMBULATORY_CARE_PROVIDER_SITE_OTHER): Payer: Medicare Other

## 2018-03-16 DIAGNOSIS — R55 Syncope and collapse: Secondary | ICD-10-CM

## 2018-05-02 ENCOUNTER — Telehealth: Payer: Self-pay | Admitting: Cardiology

## 2018-05-02 NOTE — Telephone Encounter (Signed)
°  1. Is this related to a heart monitor you are wearing?  (If the patient says no, please ask     if they are caling about ICD/pacemaker.) results request  2. What is your issue?? (If the patient is calling for results of the heart monitor this     message should be sent to nurse.) Patient requesting results   Please route to covering RN/CMA/RMA for results. Route to monitor technicians or your monitor tech representative for your site for any technical concerns

## 2018-05-10 NOTE — Telephone Encounter (Signed)
Pt aware of her monitor result 

## 2018-09-18 ENCOUNTER — Other Ambulatory Visit: Payer: Self-pay

## 2018-09-18 DIAGNOSIS — Z20822 Contact with and (suspected) exposure to covid-19: Secondary | ICD-10-CM

## 2018-09-19 LAB — NOVEL CORONAVIRUS, NAA: SARS-CoV-2, NAA: NOT DETECTED

## 2019-02-17 ENCOUNTER — Ambulatory Visit: Payer: Medicare Other

## 2019-02-17 ENCOUNTER — Ambulatory Visit: Payer: Medicare Other | Attending: Internal Medicine

## 2019-02-17 DIAGNOSIS — Z23 Encounter for immunization: Secondary | ICD-10-CM

## 2019-02-17 NOTE — Progress Notes (Signed)
   Covid-19 Vaccination Clinic  Name:  CLYDINE MCHATTON    MRN: [id number] DOB: [birth year]  02/17/2019  Ms. Bearden was observed post Covid-19 immunization for 15 minutes without incidence. She was provided with Vaccine Information Sheet and instruction to access the V-Safe system.   Ms. Parkins was instructed to call 911 with any severe reactions post vaccine: Marland Kitchen Difficulty breathing  . Swelling of your face and throat  . A fast heartbeat  . A bad rash all over your body  . Dizziness and weakness    Immunizations Administered    Name Date Dose VIS Date Route   Pfizer COVID-19 Vaccine 02/17/2019  1:30 PM 0.3 mL 01/05/2019 Intramuscular   Manufacturer: [place]   Lot: [id number]   [place]: [id number]

## 2019-03-10 ENCOUNTER — Ambulatory Visit: Payer: Medicare Other | Attending: Internal Medicine

## 2019-03-10 DIAGNOSIS — Z23 Encounter for immunization: Secondary | ICD-10-CM | POA: Insufficient documentation

## 2019-03-10 NOTE — Progress Notes (Signed)
   Covid-19 Vaccination Clinic  Name:  Danielle Hernandez    MRN: [id number] DOB: Aug 16, 1935  03/10/2019  [name] was observed post Covid-19 immunization for 15 minutes without incidence. She was provided with Vaccine Information Sheet and instruction to access the V-Safe system.   [name] was instructed to call 911 with any severe reactions post vaccine: Marland Kitchen Difficulty breathing  . Swelling of your face and throat  . A fast heartbeat  . A bad rash all over your body  . Dizziness and weakness    Immunizations Administered    Name Date Dose VIS Date Route   Pfizer COVID-19 Vaccine 03/10/2019 11:26 AM 0.3 mL 01/05/2019 Intramuscular   Manufacturer: [place]   Lot: [id number]   [place]: [id number]

## 2019-03-22 ENCOUNTER — Ambulatory Visit
Admission: RE | Admit: 2019-03-22 | Discharge: 2019-03-22 | Disposition: A | Discharge status: Home / Self Care | Payer: Medicare Other | Source: Ambulatory Visit | Attending: Physician Assistant | Admitting: Physician Assistant

## 2019-03-22 ENCOUNTER — Other Ambulatory Visit: Payer: Self-pay | Admitting: Physician Assistant

## 2019-03-22 DIAGNOSIS — W19XXXA Unspecified fall, initial encounter: Secondary | ICD-10-CM

## 2020-02-21 DIAGNOSIS — Z0000 Encounter for general adult medical examination without abnormal findings: Secondary | ICD-10-CM | POA: Diagnosis not present

## 2020-02-21 DIAGNOSIS — J309 Allergic rhinitis, unspecified: Secondary | ICD-10-CM | POA: Diagnosis not present

## 2020-02-21 DIAGNOSIS — N816 Rectocele: Secondary | ICD-10-CM | POA: Diagnosis not present

## 2020-02-21 DIAGNOSIS — Z79899 Other long term (current) drug therapy: Secondary | ICD-10-CM | POA: Diagnosis not present

## 2020-02-21 DIAGNOSIS — N3281 Overactive bladder: Secondary | ICD-10-CM | POA: Diagnosis not present

## 2020-02-21 DIAGNOSIS — M810 Age-related osteoporosis without current pathological fracture: Secondary | ICD-10-CM | POA: Diagnosis not present

## 2020-02-21 DIAGNOSIS — M797 Fibromyalgia: Secondary | ICD-10-CM | POA: Diagnosis not present

## 2020-02-21 DIAGNOSIS — K5790 Diverticulosis of intestine, part unspecified, without perforation or abscess without bleeding: Secondary | ICD-10-CM | POA: Diagnosis not present

## 2020-02-21 DIAGNOSIS — K219 Gastro-esophageal reflux disease without esophagitis: Secondary | ICD-10-CM | POA: Diagnosis not present

## 2020-02-21 DIAGNOSIS — K589 Irritable bowel syndrome without diarrhea: Secondary | ICD-10-CM | POA: Diagnosis not present

## 2020-02-21 DIAGNOSIS — M1990 Unspecified osteoarthritis, unspecified site: Secondary | ICD-10-CM | POA: Diagnosis not present

## 2020-03-05 ENCOUNTER — Emergency Department (HOSPITAL_COMMUNITY): Payer: Medicare Other

## 2020-03-05 ENCOUNTER — Emergency Department (HOSPITAL_COMMUNITY)
Admission: EM | Admit: 2020-03-05 | Discharge: 2020-03-06 | Disposition: A | Discharge status: Home / Self Care | Payer: Medicare Other | Attending: Emergency Medicine | Admitting: Emergency Medicine

## 2020-03-05 DIAGNOSIS — R6889 Other general symptoms and signs: Secondary | ICD-10-CM | POA: Diagnosis not present

## 2020-03-05 DIAGNOSIS — M546 Pain in thoracic spine: Secondary | ICD-10-CM | POA: Diagnosis not present

## 2020-03-05 DIAGNOSIS — R109 Unspecified abdominal pain: Secondary | ICD-10-CM | POA: Diagnosis not present

## 2020-03-05 DIAGNOSIS — R079 Chest pain, unspecified: Secondary | ICD-10-CM | POA: Insufficient documentation

## 2020-03-05 DIAGNOSIS — R404 Transient alteration of awareness: Secondary | ICD-10-CM | POA: Diagnosis not present

## 2020-03-05 DIAGNOSIS — I10 Essential (primary) hypertension: Secondary | ICD-10-CM | POA: Insufficient documentation

## 2020-03-05 DIAGNOSIS — S0083XA Contusion of other part of head, initial encounter: Secondary | ICD-10-CM | POA: Diagnosis not present

## 2020-03-05 DIAGNOSIS — W19XXXA Unspecified fall, initial encounter: Secondary | ICD-10-CM | POA: Insufficient documentation

## 2020-03-05 DIAGNOSIS — R410 Disorientation, unspecified: Secondary | ICD-10-CM | POA: Insufficient documentation

## 2020-03-05 DIAGNOSIS — S3991XA Unspecified injury of abdomen, initial encounter: Secondary | ICD-10-CM | POA: Diagnosis not present

## 2020-03-05 DIAGNOSIS — M25512 Pain in left shoulder: Secondary | ICD-10-CM | POA: Diagnosis not present

## 2020-03-05 DIAGNOSIS — Z79899 Other long term (current) drug therapy: Secondary | ICD-10-CM | POA: Insufficient documentation

## 2020-03-05 DIAGNOSIS — S42215A Unspecified nondisplaced fracture of surgical neck of left humerus, initial encounter for closed fracture: Secondary | ICD-10-CM | POA: Diagnosis not present

## 2020-03-05 DIAGNOSIS — M545 Low back pain, unspecified: Secondary | ICD-10-CM | POA: Insufficient documentation

## 2020-03-05 DIAGNOSIS — M25511 Pain in right shoulder: Secondary | ICD-10-CM | POA: Insufficient documentation

## 2020-03-05 DIAGNOSIS — M79603 Pain in arm, unspecified: Secondary | ICD-10-CM | POA: Diagnosis not present

## 2020-03-05 DIAGNOSIS — S299XXA Unspecified injury of thorax, initial encounter: Secondary | ICD-10-CM | POA: Diagnosis not present

## 2020-03-05 DIAGNOSIS — M7989 Other specified soft tissue disorders: Secondary | ICD-10-CM | POA: Diagnosis not present

## 2020-03-05 DIAGNOSIS — S42212A Unspecified displaced fracture of surgical neck of left humerus, initial encounter for closed fracture: Secondary | ICD-10-CM | POA: Diagnosis not present

## 2020-03-05 DIAGNOSIS — S4992XA Unspecified injury of left shoulder and upper arm, initial encounter: Secondary | ICD-10-CM | POA: Diagnosis present

## 2020-03-05 DIAGNOSIS — R55 Syncope and collapse: Secondary | ICD-10-CM | POA: Diagnosis not present

## 2020-03-05 DIAGNOSIS — Z743 Need for continuous supervision: Secondary | ICD-10-CM | POA: Diagnosis not present

## 2020-03-05 LAB — LACTIC ACID, PLASMA: Lactic Acid, Venous: 1.5 mmol/L (ref 0.5–1.9)

## 2020-03-05 LAB — COMPREHENSIVE METABOLIC PANEL
ALT: 28 U/L (ref 0–44)
AST: 39 U/L (ref 15–41)
Albumin: 3.7 g/dL (ref 3.5–5.0)
Alkaline Phosphatase: 63 U/L (ref 38–126)
Anion gap: 12 (ref 5–15)
BUN: 25 mg/dL — ABNORMAL HIGH (ref 8–23)
CO2: 24 mmol/L (ref 22–32)
Calcium: 8.8 mg/dL — ABNORMAL LOW (ref 8.9–10.3)
Chloride: 105 mmol/L (ref 98–111)
Creatinine, Ser: 0.83 mg/dL (ref 0.44–1.00)
GFR, Estimated: >60 mL/min (ref >60)
Glucose, Bld: 147 mg/dL — ABNORMAL HIGH (ref 70–99)
Potassium: 4 mmol/L (ref 3.5–5.1)
Sodium: 141 mmol/L (ref 135–145)
Total Bilirubin: 1.3 mg/dL — ABNORMAL HIGH (ref 0.3–1.2)
Total Protein: 6.4 g/dL — ABNORMAL LOW (ref 6.5–8.1)

## 2020-03-05 LAB — CK: Total CK: 782 U/L — ABNORMAL HIGH (ref 38–234)

## 2020-03-05 LAB — CBC
HCT: 37.9 % (ref 36.0–46.0)
Hemoglobin: 12.3 g/dL (ref 12.0–15.0)
MCH: 31.8 pg (ref 26.0–34.0)
MCHC: 32.5 g/dL (ref 30.0–36.0)
MCV: 97.9 fL (ref 80.0–100.0)
Platelets: 224 10*3/uL (ref 150–400)
RBC: 3.87 MIL/uL (ref 3.87–5.11)
RDW: 14.5 % (ref 11.5–15.5)
WBC: 11.8 10*3/uL — ABNORMAL HIGH (ref 4.0–10.5)
nRBC: 0 % (ref 0.0–0.2)

## 2020-03-05 LAB — AMMONIA: Ammonia: <9 umol/L — ABNORMAL LOW (ref 9–35)

## 2020-03-05 LAB — LIPASE, BLOOD: Lipase: 25 U/L (ref 11–51)

## 2020-03-05 LAB — TSH: TSH: 0.532 u[IU]/mL (ref 0.350–4.500)

## 2020-03-05 MED ORDER — IOHEXOL 300 MG/ML  SOLN
100.0000 mL | Freq: Once | INTRAMUSCULAR | Status: AC | PRN
  Administered 2020-03-05: 100 mL via INTRAVENOUS

## 2020-03-05 NOTE — ED Notes (Signed)
This RN called daughter, Tessie Fass and gave her an update

## 2020-03-05 NOTE — ED Notes (Signed)
Patient incont of urine and stool. Patient cleaned up and peri-care formed. Brief changed.

## 2020-03-05 NOTE — ED Notes (Signed)
Pt in CT.

## 2020-03-05 NOTE — ED Notes (Signed)
Ortho tech called to come place shoulder immobilizer

## 2020-03-05 NOTE — ED Notes (Signed)
Pt still in xray. Will obtain vitals when pt returns

## 2020-03-05 NOTE — ED Provider Notes (Signed)
Care assumed from Dr. Sherry Ruffing, patient following a fall at home with fracture of the surgical neck of left humerus and otherwise unremarkable CT scans pending urinalysis.  Will need to have shared decision making regarding admission versus discharge home.  6:50 AM Still have not been able to obtain urine.  Nursing staff was unable to perform in and out catheterization, and patient is incontinent.  Urine collection device has been placed.  Of note, her confusion did get worse overnight.  Case is signed out to Dr. Lafonda Mosses, MD 09/81/19 [phone]

## 2020-03-05 NOTE — ED Notes (Signed)
Pt being transported to xray. This RN tried to obtain labs from pt IV, blood clotted, will obtain labs when pt is returned.

## 2020-03-05 NOTE — ED Notes (Signed)
[phone] Patsy pt daughter would like an update

## 2020-03-05 NOTE — ED Triage Notes (Addendum)
Pt BIB [facility] from home. Pt lives by herself and did not show up to church, family and friends went to check on her and found her lying on floor. EMS reports that pt does not remember how she got to the floor and how long she was laying there. Pt is reporting pain to L arm and shoulder. Pt has a skin tear to L elbow and redness and swelling in the R eye. EMS reports that pt had some difficulty with word finding but according to family this is baseline. Family reports her confusion and difficulty speaking is increased.  Pt is incontinent.  VS in EMS  174/100 HR in 80s, NSR RR 20, 98% RA  CBG 160

## 2020-03-05 NOTE — ED Provider Notes (Signed)
[place] EMERGENCY DEPARTMENT Provider Note   CSN: [id number] Arrival date & time: 03/05/20  1905     History Chief Complaint  Patient presents with  . Fall    Danielle Hernandez is a 85 y.o. female.  The history is provided by the patient and medical records. The history is limited by the condition of the patient. No language interpreter was used.  Fall This is a recurrent problem. The current episode started yesterday. The problem occurs rarely. The problem has not changed since onset.Associated symptoms include chest pain, abdominal pain and headaches. Pertinent negatives include no shortness of breath. Nothing aggravates the symptoms. Nothing relieves the symptoms. She has tried nothing for the symptoms. The treatment provided no relief.       Past Medical History:  Diagnosis Date  . Arthritis   . Headache(784.0)   . HTN (hypertension)   . Osteoporosis   . Raynaud disease     Patient Active Problem List   Diagnosis Date Noted  . CDNH (chondrodermatitis nodularis helicis), right 40/98/1191  . Impacted cerumen of left ear 03/04/2016  . Fecal incontinence 04/26/2012  . Lumbar stenosis 02/08/2011    Past Surgical History:  Procedure Laterality Date  . ABDOMINAL HYSTERECTOMY    . BACK SURGERY    . TONSILLECTOMY    . under chin cyst removed    . WRIST GANGLION EXCISION     right hand     OB History   No obstetric history on file.     Family History  Problem Relation Age of Onset  . Diabetes Mother   . Arthritis Mother   . Heart disease Father 22       Heart attack, blood clot to the leg  . Stroke Father   . Clotting disorder Son     Social History   Tobacco Use  . Smoking status: Never Smoker  . Smokeless tobacco: Never Used  Substance Use Topics  . Alcohol use: No  . Drug use: No    Home Medications Prior to Admission medications   Medication Sig Start Date End Date Taking? Authorizing Provider  Calcium Carbonate-Vitamin D  (CALCIUM + D PO) Take 1 tablet by mouth 2 (two) times daily.      [provider]  estrogens, conjugated, (PREMARIN) 0.625 MG tablet Take 0.625 mg by mouth every other day. Take daily for 21 days then do not take for 7 days.     [provider]  gabapentin (NEURONTIN) 300 MG capsule Take 300 mg by mouth 3 (three) times daily.    [provider]  lisinopril (PRINIVIL,ZESTRIL) 10 MG tablet Take 1 tablet by mouth daily. 12/13/17   [provider]  Multiple Vitamins-Minerals (MULTIVITAMINS THER. W/MINERALS) TABS Take 1 tablet by mouth daily.      [provider]    Allergies    Penicillins, Sulfa antibiotics, and Sulfa drugs cross reactors  Review of Systems   Review of Systems  Constitutional: Positive for fatigue. Negative for chills, diaphoresis and fever.  HENT: Negative for congestion.   Eyes: Negative for visual disturbance.  Respiratory: Negative for chest tightness, shortness of breath and wheezing.   Cardiovascular: Positive for chest pain. Negative for palpitations and leg swelling.  Gastrointestinal: Positive for abdominal pain. Negative for diarrhea, nausea and vomiting.  Genitourinary: Negative for flank pain.  Musculoskeletal: Positive for back pain and neck pain. Negative for neck stiffness.  Skin: Positive for color change (bruising). Negative for wound.  Neurological:  Positive for headaches. Negative for dizziness, weakness and light-headedness.  Psychiatric/Behavioral: Negative for agitation.  All other systems reviewed and are negative.   Physical Exam Updated Vital Signs BP (!) 170/64   Pulse 92   Resp 19   SpO2 97%   Physical Exam Vitals and nursing note reviewed.  Constitutional:      General: She is not in acute distress.    Appearance: She is well-developed and well-nourished. She is not ill-appearing, toxic-appearing or diaphoretic.  HENT:     Head:      Comments: Bruising on R forehead  Extraocular motions  are intact with no abnormality in pupil exam.  No evidence of entrapment.  No facial tenderness otherwise.    Nose: No congestion.     Mouth/Throat:     Mouth: Mucous membranes are moist.     Pharynx: No posterior oropharyngeal erythema.  Eyes:     Conjunctiva/sclera: Conjunctivae normal.     Pupils: Pupils are equal, round, and reactive to light.  Cardiovascular:     Rate and Rhythm: Normal rate and regular rhythm.     Pulses: Normal pulses.     Heart sounds: No murmur heard.   Pulmonary:     Effort: Pulmonary effort is normal. No respiratory distress.     Breath sounds: Normal breath sounds. No wheezing, rhonchi or rales.  Chest:     Chest wall: Tenderness present.  Abdominal:     General: Abdomen is flat.     Palpations: Abdomen is soft.     Tenderness: There is abdominal tenderness. There is no right CVA tenderness, left CVA tenderness, guarding or rebound.  Musculoskeletal:        General: Tenderness present. No edema.     Right shoulder: Tenderness present.     Left shoulder: Tenderness present.     Cervical back: Neck supple. Tenderness present.     Thoracic back: Tenderness present.     Lumbar back: Tenderness present.       Back:     Right lower leg: No edema.     Left lower leg: No edema.  Skin:    General: Skin is warm and dry.     Capillary Refill: Capillary refill takes less than 2 seconds.     Findings: Bruising present. No erythema.  Neurological:     Mental Status: She is alert. She is confused.     Cranial Nerves: No dysarthria or facial asymmetry.     Sensory: No sensory deficit.     Motor: No weakness or abnormal muscle tone.  Psychiatric:        Mood and Affect: Mood and affect and mood normal.     ED Results / Procedures / Treatments   Labs (all labs ordered are listed, but only abnormal results are displayed) Labs Reviewed  CBC - Abnormal; Notable for the following components:      Result Value   WBC 11.8 (*)    All other components within  normal limits  COMPREHENSIVE METABOLIC PANEL - Abnormal; Notable for the following components:   Glucose, Bld 147 (*)    BUN 25 (*)    Calcium 8.8 (*)    Total Protein 6.4 (*)    Total Bilirubin 1.3 (*)    All other components within normal limits  AMMONIA - Abnormal; Notable for the following components:   Ammonia <9 (*)    All other components within normal limits  CK - Abnormal; Notable for the following components:  Total CK 782 (*)    All other components within normal limits  URINE CULTURE  LACTIC ACID, PLASMA  LIPASE, BLOOD  TSH  URINALYSIS, ROUTINE W REFLEX MICROSCOPIC    EKG EKG Interpretation  Date/Time:  Wednesday March 05 2020 23:18:58 EST Ventricular Rate:  89 PR Interval:  134 QRS Duration: 76 QT Interval:  382 QTC Calculation: 464 R Axis:   69 Text Interpretation: Normal sinus rhythm Nonspecific ST abnormality Abnormal ECG When compared to prior, more artifact but otherwise no significant changes. No STEMI Confirmed by Antony Blackbird [phone]) on 03/05/2020 11:26:06 PM   Radiology DG Shoulder Right  Result Date: 03/05/2020 CLINICAL DATA:  Bilateral shoulder pain after fall EXAM: RIGHT SHOULDER - 2+ VIEW COMPARISON:  None. FINDINGS: There is no evidence of fracture relative elevation of the humeral head in relation to the glenoid. Productive change along the greater tubercle. Degenerative change of the [facility] joint and glenohumeral joint. Soft tissues are unremarkable. IMPRESSION: 1. No evidence of fracture. 2. Elevation of the right humeral head in relation to the glenoid suggests rotator cuff disease. 3. Degenerative changes of the right shoulder. Electronically Signed   By: Dahlia Bailiff MD   On: 03/05/2020 20:52   CT Head Wo Contrast  Result Date: 03/05/2020 CLINICAL DATA:  Found down EXAM: CT HEAD WITHOUT CONTRAST TECHNIQUE: Contiguous axial images were obtained from the base of the skull through the vertex without intravenous contrast. COMPARISON:  None.  FINDINGS: Brain: No evidence of acute territorial infarction, hemorrhage, hydrocephalus,extra-axial collection or mass lesion/mass effect. There is dilatation the ventricles and sulci consistent with age-related atrophy. Low-attenuation changes in the deep white matter consistent with small vessel ischemia. Vascular: No hyperdense vessel or unexpected calcification. Skull: The skull is intact. No fracture or focal lesion identified. Sinuses/Orbits: The visualized paranasal sinuses and mastoid air cells are clear. The orbits and globes intact. Other: None Cervical spine: Alignment: There is a minimal anterolisthesis of C3 on C4 and C4 on C5. Skull base and vertebrae: Visualized skull base is intact. No atlanto-occipital dissociation. The vertebral body heights are well maintained. No fracture or pathologic osseous lesion seen. Soft tissues and spinal canal: The visualized paraspinal soft tissues are unremarkable. No prevertebral soft tissue swelling is seen. The spinal canal is grossly unremarkable, no large epidural collection or significant canal narrowing. Disc levels: Multilevel cervical spine spondylosis seen with disc osteophyte complex and uncovertebral osteophytes most notable at C5-C6 and C6-C7 moderate neural foraminal narrowing. Upper chest: The lung apices are clear. Thoracic inlet is within normal limits. Other: None IMPRESSION: No acute intracranial abnormality. Findings consistent with age related atrophy and chronic small vessel ischemia No acute fracture or malalignment of the spine. Electronically Signed   By: Prudencio Pair M.D.   On: 03/05/2020 22:59   CT Cervical Spine Wo Contrast  Result Date: 03/05/2020 CLINICAL DATA:  Found down EXAM: CT HEAD WITHOUT CONTRAST TECHNIQUE: Contiguous axial images were obtained from the base of the skull through the vertex without intravenous contrast. COMPARISON:  None. FINDINGS: Brain: No evidence of acute territorial infarction, hemorrhage,  hydrocephalus,extra-axial collection or mass lesion/mass effect. There is dilatation the ventricles and sulci consistent with age-related atrophy. Low-attenuation changes in the deep white matter consistent with small vessel ischemia. Vascular: No hyperdense vessel or unexpected calcification. Skull: The skull is intact. No fracture or focal lesion identified. Sinuses/Orbits: The visualized paranasal sinuses and mastoid air cells are clear. The orbits and globes intact. Other: None Cervical spine: Alignment: There is a minimal  anterolisthesis of C3 on C4 and C4 on C5. Skull base and vertebrae: Visualized skull base is intact. No atlanto-occipital dissociation. The vertebral body heights are well maintained. No fracture or pathologic osseous lesion seen. Soft tissues and spinal canal: The visualized paraspinal soft tissues are unremarkable. No prevertebral soft tissue swelling is seen. The spinal canal is grossly unremarkable, no large epidural collection or significant canal narrowing. Disc levels: Multilevel cervical spine spondylosis seen with disc osteophyte complex and uncovertebral osteophytes most notable at C5-C6 and C6-C7 moderate neural foraminal narrowing. Upper chest: The lung apices are clear. Thoracic inlet is within normal limits. Other: None IMPRESSION: No acute intracranial abnormality. Findings consistent with age related atrophy and chronic small vessel ischemia No acute fracture or malalignment of the spine. Electronically Signed   By: Prudencio Pair M.D.   On: 03/05/2020 22:59   CT CHEST ABDOMEN PELVIS W CONTRAST  Result Date: 03/05/2020 CLINICAL DATA:  Found down. EXAM: CT CHEST, ABDOMEN, AND PELVIS WITH CONTRAST TECHNIQUE: Multidetector CT imaging of the chest, abdomen and pelvis was performed following the standard protocol during bolus administration of intravenous contrast. CONTRAST:  12mL OMNIPAQUE IOHEXOL 300 MG/ML  SOLN COMPARISON:  CT dated 03/20/2013. FINDINGS: CT CHEST FINDINGS  Cardiovascular: The heart size is mildly enlarged. There is no large centrally located pulmonary embolism. Atherosclerotic changes are noted of the thoracic aorta without evidence for dissection or aneurysm. The partially visualized arch vessels are grossly patent. Mediastinum/Nodes: -- No mediastinal lymphadenopathy. -- No hilar lymphadenopathy. -- No axillary lymphadenopathy. -- No supraclavicular lymphadenopathy. --multiple thyroid nodules are noted measuring up to approximately 3 cm. -  Unremarkable esophagus. Lungs/Pleura: There is no pneumothorax or large pleural effusion. There are areas of scarring and atelectasis involving the bilateral lower lobes. Musculoskeletal: There is an acute, minimally displaced and minimally impacted fracture through the surgical neck of the proximal left humerus. The fracture plane extends into the greater tuberosity. There is no dislocation. CT ABDOMEN PELVIS FINDINGS Hepatobiliary: The liver is normal. Normal gallbladder.There is no biliary ductal dilation. Pancreas: Normal contours without ductal dilatation. No peripancreatic fluid collection. Spleen: Unremarkable. Adrenals/Urinary Tract: --Adrenal glands: Unremarkable. --Right kidney/ureter: No hydronephrosis or radiopaque kidney stones. --Left kidney/ureter: No hydronephrosis or radiopaque kidney stones. --Urinary bladder: There is a left-sided bladder diverticula. Stomach/Bowel: --Stomach/Duodenum: No hiatal hernia or other gastric abnormality. Normal duodenal course and caliber. --Small bowel: Unremarkable. --Colon: There is an above average amount of stool throughout the colon. There is sigmoid diverticulosis without CT evidence for diverticulitis. --Appendix: Normal. Vascular/Lymphatic: Atherosclerotic calcification is present within the non-aneurysmal abdominal aorta, without hemodynamically significant stenosis. --No retroperitoneal lymphadenopathy. --No mesenteric lymphadenopathy. --No pelvic or inguinal  lymphadenopathy. Reproductive: Unremarkable Other: No ascites or free air. The abdominal wall is normal. Musculoskeletal. Advanced multilevel degenerative changes are noted throughout the patient's thoracolumbar spine. There is severe disc height loss at the L1-L2 level. The patient has undergone prior L2 through L5 posterior fusion. The hardware is intact where visualized. IMPRESSION: 1. Acute, minimally displaced and minimally impacted fracture through the surgical neck of the proximal left humerus. No dislocation. 2. Multiple thyroid nodules measuring up to 3 cm. Outpatient thyroid ultrasound is recommended.(Ref: J Am Coll Radiol. 2015 Feb;12(2): 143-50). 3. Sigmoid diverticulosis without CT evidence for diverticulitis. 4. Large amount of stool throughout the colon. Aortic Atherosclerosis (ICD10-I70.0). Electronically Signed   By: Constance Holster M.D.   On: 03/05/2020 23:12   DG Shoulder Left  Result Date: 03/05/2020 CLINICAL DATA:  Bilateral shoulder pain after fall EXAM: LEFT  SHOULDER - 2+ VIEW COMPARISON:  None. FINDINGS: Minimally displaced comminuted LEFT humeral surgical neck fracture. Soft tissue swelling. Degenerative change of the [facility] joint. Visualized lung field is clear. IMPRESSION: Minimally displaced comminuted LEFT humeral surgical neck fracture. Electronically Signed   By: Dahlia Bailiff MD   On: 03/05/2020 20:49    Procedures Procedures   Medications Ordered in ED Medications  iohexol (OMNIPAQUE) 300 MG/ML solution 100 mL (100 mLs Intravenous Contrast Given 03/05/20 2255)    ED Course  I have reviewed the triage vital signs and the nursing notes.  Pertinent labs & imaging results that were available during my care of the patient were reviewed by me and considered in my medical decision making (see chart for details).    MDM Rules/Calculators/A&P                          Danielle Hernandez is a 85 y.o. female with a past medical history significant for osteoporosis, hypertension,  arthritis, and Raynaud's disease who presents for fall.  Patient is unable to tell me what happened but she reportedly was found down for an unknown amount of time today.  She thinks it may have been yesterday that she fell but she does not remember.  She missed church and was found on her ground.  She has bruising on her right head and is complaining of severe pain in her head, neck, right chest, bilateral shoulders, right abdomen/pelvis, and her entire back.  She is a difficult historian but is denying any other preceding symptoms such as fevers, chills, chest pain, shortness of breath, cough, or any other symptoms before her fall.  She does not member if it was mechanical or syncopal.  She says that she was unable to get up today.  On exam, lungs are clear but right chest is tender.  Abdomen is tender on the right side.  She has tenderness in the right pelvis area but did not have significant pain with hip manipulation.  She had tenderness in both shoulders right worse than left.  Tender on her entire back and her C-spine under the collar.  No laceration seen but she has bruising on her right forehead.  Pupils are symmetric reactive normal extraocular movements.  Clinically I suspect patient had a fall versus syncope yesterday and then was down all night.  We will get labs including a CK and will also get imaging to look for traumatic injuries.  Anticipate reassessment after work-up to determine disposition.  Patient still awaiting results of diagnostic work-up. Care transferred to oncoming team while waiting for traumatic imaging studies and labs. Anticipate reassessment to determine disposition.    Final Clinical Impression(s) / ED Diagnoses Final diagnoses:  Fall, initial encounter  Closed nondisplaced fracture of surgical neck of left humerus, unspecified fracture morphology, initial encounter    Rx / DC Orders ED Discharge Orders    None      Clinical Impression: 1. Fall      Disposition: Discharge  Condition: Good  I have discussed the results, Dx and Tx plan with the pt(& family if present). He/she/they expressed understanding and agree(s) with the plan. Discharge instructions discussed at great length. Strict return precautions discussed and pt &/or family have verbalized understanding of the instructions. No further questions at time of discharge.    New Prescriptions   No medications on file    Follow Up: No follow-up provider specified.    Nychelle Cassata, Gwenyth Allegra,  MD 03/05/20 2344

## 2020-03-05 NOTE — ED Notes (Signed)
Ortho tech at bedside 

## 2020-03-05 NOTE — ED Notes (Signed)
Patient incont of urine. Patient changed and new brief put on. Unable to obtain in and out d/t patient voiding right before.

## 2020-03-06 LAB — URINALYSIS, ROUTINE W REFLEX MICROSCOPIC
Bilirubin Urine: NEGATIVE
Glucose, UA: NEGATIVE mg/dL
Ketones, ur: 40 mg/dL — AB
Leukocytes,Ua: NEGATIVE
Nitrite: NEGATIVE
Protein, ur: NEGATIVE mg/dL
Specific Gravity, Urine: 1.02 (ref 1.005–1.030)
pH: 6.5 (ref 5.0–8.0)

## 2020-03-06 LAB — URINALYSIS, MICROSCOPIC (REFLEX): Squamous Epithelial / HPF: NONE SEEN (ref 0–5)

## 2020-03-06 MED ORDER — ACETAMINOPHEN 325 MG PO TABS: 650.0000 mg | ORAL_TABLET | Freq: Once | ORAL | Status: DC

## 2020-03-06 NOTE — ED Notes (Signed)
Patient Alert and oriented to baseline. Stable and ambulatory to baseline. Patient verbalized understanding of the discharge instructions.  Patient belongings were taken by the patient.   

## 2020-03-06 NOTE — ED Notes (Signed)
This RN called pt daughter, Tessie Fass, to give update. Pt daughter would like updates periodically. Mobile number in chart.

## 2020-03-06 NOTE — ED Notes (Signed)
Patient cleaned up and changed. Brief changed.

## 2020-03-06 NOTE — ED Notes (Signed)
Unsuccessful second attempt at in and out cath. Pt brief wet but no urine when cath. Will try to obtain urine bag from peds.

## 2020-03-06 NOTE — Progress Notes (Signed)
Orthopedic Tech Progress Note Patient Details:  Danielle Hernandez [birth year] [id number]  Ortho Devices Type of Ortho Device: Arm sling Ortho Device/Splint Location: Left Arm Ortho Device/Splint Interventions: Application,Adjustment   Post Interventions Patient Tolerated: Well Instructions Provided: Adjustment of device   Danielle Hernandez E Lasharn Bufkin 03/06/2020, 12:10 AM

## 2020-03-06 NOTE — ED Provider Notes (Signed)
  Physical Exam  BP (!) 151/68   Pulse 81   Temp 99.3 F (37.4 C) (Oral)   Resp 15   SpO2 96%   Physical Exam  ED Course/Procedures     Procedures  MDM  Patient with fall.  Humeral neck fracture.  No other apparent injury.  Had had some confusion.  Urinalysis took 18 hours to get.  However does not show infection.  Will discuss with patient about possible discharge       Davonna Belling, MD 03/06/20 1250

## 2020-03-06 NOTE — ED Notes (Addendum)
Pt refusing to let this RN and NT Erin in and out cath. Pt more confused than when she arrived. Dr. Roxanne Mins notified.

## 2020-03-06 NOTE — ED Notes (Signed)
Pt reporting no pain at this time and still refusing in and out cath

## 2020-03-06 NOTE — ED Notes (Signed)
Pt took 4 sips of water.

## 2020-03-06 NOTE — Discharge Instructions (Signed)
Motrin and Tylenol may help with the pain.  Follow with orthopedic surgery 1 to 2 weeks.

## 2020-03-07 LAB — URINE CULTURE: Culture: NO GROWTH

## 2020-03-17 ENCOUNTER — Ambulatory Visit: Payer: Self-pay

## 2020-03-17 ENCOUNTER — Ambulatory Visit: Payer: Medicare Other | Admitting: Physician Assistant

## 2020-03-17 ENCOUNTER — Ambulatory Visit: Payer: Medicare Other | Admitting: Orthopaedic Surgery

## 2020-03-17 ENCOUNTER — Encounter: Payer: Self-pay | Admitting: Physician Assistant

## 2020-03-17 DIAGNOSIS — S42352A Displaced comminuted fracture of shaft of humerus, left arm, initial encounter for closed fracture: Secondary | ICD-10-CM

## 2020-03-17 DIAGNOSIS — M25512 Pain in left shoulder: Secondary | ICD-10-CM | POA: Diagnosis not present

## 2020-03-17 NOTE — Progress Notes (Signed)
Office Visit Note   Patient: Danielle Hernandez           Date of Birth: 02/18/1935           MRN: [id number] Visit Date: 03/17/2020              Requested by: Mayra Neer, MD [address] [place],  [place] [address] PCP: Mayra Neer, MD   Assessment & Plan: Visit Diagnoses:  1. Left shoulder pain, unspecified chronicity     Plan: We will continue the sling until follow-up in 1 month.  She can take it off for bathing and for gentle range of motion of the elbow wrist and hand.  Discussed range of motion exercises.  Also discussed that she should need no overhead activity extreme internal or external rotation of the left shoulder.  Questions were encouraged and answered with patient and her daughters present.  We will see her back in 1 month obtain 3 views of the left shoulder at that time and most likely schedule physical therapy at that time.  Follow-Up Instructions: Return in about 4 weeks (around 04/14/2020) for Radiographs.   Orders:  Orders Placed This Encounter  Procedures  . XR Shoulder Left   No orders of the defined types were placed in this encounter.     Procedures: No procedures performed   Clinical Data: No additional findings.   Subjective: Chief Complaint  Patient presents with  . Neck - Pain  . Left Shoulder - Pain    HPI Danielle Hernandez 85 year old female were seen for left shoulder pain status post fall on 03/05/2020.  She is unsure of the mechanism of her fall.  She has been worked up for syncopal episodes hypotension and despite this has no known etiology for falls.  She presents today with her daughter.  She was seen in the ER on 03/05/2020 after her fall that was unwitnessed.  She was found to have a proximal humerus fracture.  She is placed in sling she is seen today for follow-up.  She is taking Tylenol for pain.  She denies any numbness tingling down the arm. Radiographs of her left shoulder dated 03/05/2020 are reviewed and show a  comminuted minimally displaced left humeral head and surgical neck fracture.  The humeral heads well located. Review of Systems See HPI otherwise negative.  Objective: Vital Signs: There were no vitals taken for this visit.  Physical Exam Constitutional:      Appearance: She is not ill-appearing or diaphoretic.  Pulmonary:     Effort: Pulmonary effort is normal.  Neurological:     Mental Status: She is alert and oriented to person, place, and time.  Psychiatric:        Mood and Affect: Mood normal.     Ortho Exam Left hand full range of motion sensation grossly intact.  Radial pulse 2+.  She has good range of motion of her elbow.  She is nontender about the elbow.  Has full supination pronation left forearm. Specialty Comments:  No specialty comments available.  Imaging: XR Shoulder Left  Result Date: 03/17/2020 Left shoulder 2 views: Show well located left humeral head.  Surgical neck fracture with some early signs of consolidation.  There is also comminution to the humeral head.  Fracture remains basically unchanged in alignment position from prior films.    PMFS History: Patient Active Problem List   Diagnosis Date Noted  . CDNH (chondrodermatitis nodularis helicis), right 58/09/9831  . Impacted cerumen  of left ear 03/04/2016  . Fecal incontinence 04/26/2012  . Lumbar stenosis 02/08/2011   Past Medical History:  Diagnosis Date  . Arthritis   . Headache(784.0)   . HTN (hypertension)   . Osteoporosis   . Raynaud disease     Family History  Problem Relation Age of Onset  . Diabetes Mother   . Arthritis Mother   . Heart disease Father 50       Heart attack, blood clot to the leg  . Stroke Father   . Clotting disorder Son     Past Surgical History:  Procedure Laterality Date  . ABDOMINAL HYSTERECTOMY    . BACK SURGERY    . TONSILLECTOMY    . under chin cyst removed    . WRIST GANGLION EXCISION     right hand   Social History   Occupational History  .  Not on file  Tobacco Use  . Smoking status: Never Smoker  . Smokeless tobacco: Never Used  Substance and Sexual Activity  . Alcohol use: No  . Drug use: No  . Sexual activity: Not on file

## 2020-03-20 ENCOUNTER — Telehealth: Payer: Self-pay | Admitting: Physician Assistant

## 2020-03-20 NOTE — Telephone Encounter (Signed)
Please advise 

## 2020-03-20 NOTE — Telephone Encounter (Signed)
I called the patient and told her to continue the sling until she follows up with [place] in 1 month from her last office visit

## 2020-03-20 NOTE — Telephone Encounter (Signed)
Patient's daughter Rosaria Ferries called asked how long will her mother need to wear the sling? The number to contact Patsy is [phone]

## 2020-04-14 ENCOUNTER — Ambulatory Visit: Payer: Medicare Other | Admitting: Physician Assistant

## 2020-04-14 ENCOUNTER — Encounter: Payer: Self-pay | Admitting: Physician Assistant

## 2020-04-14 ENCOUNTER — Ambulatory Visit (INDEPENDENT_AMBULATORY_CARE_PROVIDER_SITE_OTHER): Payer: Medicare Other

## 2020-04-14 DIAGNOSIS — M25512 Pain in left shoulder: Secondary | ICD-10-CM

## 2020-04-14 DIAGNOSIS — S42202D Unspecified fracture of upper end of left humerus, subsequent encounter for fracture with routine healing: Secondary | ICD-10-CM

## 2020-04-14 NOTE — Progress Notes (Signed)
Office Visit Note   Patient: Danielle Hernandez           Date of Birth: 10/12/1935           MRN: [id number] Visit Date: 04/14/2020              Requested by: Danielle Neer, MD [address] Danielle Hernandez,  Danielle Hernandez PCP: Danielle Neer, MD   Assessment & Plan: Visit Diagnoses:  1. Left shoulder pain, unspecified chronicity   2. Closed fracture of proximal end of left humerus with routine healing, unspecified fracture morphology, subsequent encounter     Plan: She will start pendulum and Codman exercises as shown today.  No heavy lifting with the left arm.  No overhead activity for the next 2 weeks or extreme abduction of the left shoulder.  She can come out of the sling for comfort.  She is encouraged to continue to work on range of motion elbow forearm wrist and hand.  We will set her up for outpatient physical therapy for range of motion and strengthening left shoulder there to go slow.  We will start therapy in 2 weeks.  Questions were encouraged and answered patient and her daughters present today.  Follow-Up Instructions: Return in about 4 weeks (around 05/12/2020) for Radiographs.   Orders:  Orders Placed This Encounter  Procedures  . XR Shoulder Left   No orders of the defined types were placed in this encounter.     Procedures: No procedures performed   Clinical Data: No additional findings.   Subjective: Chief Complaint  Patient presents with  . Left Shoulder - Pain    HPI Danielle Hernandez returns today now 6 weeks status post left proximal humerus fracture.  She is overall slowly trending towards improvement notes that she has stiffness in her elbow.  She also notes that the sling seems to be to large for her.  She did obtain a squeeze ball using his.  He is taking no pain medications. Review of Systems See HPI  Objective: Vital Signs: There were no vitals taken for this visit.  Physical Exam Constitutional:      Appearance: She is not  ill-appearing or diaphoretic.  Pulmonary:     Effort: Pulmonary effort is normal.  Neurological:     Mental Status: She is alert and oriented to person, place, and time.  Psychiatric:        Mood and Affect: Mood normal.     Ortho Exam Left shoulder fluid motion with slight external/internal rotation.  Left elbow full range of motion.  She has full supination pronation of forearm.  Hand is neurovascular intact. Specialty Comments:  No specialty comments available.  Imaging: XR Shoulder Left  Result Date: 04/14/2020 Left shoulder 3 views: Significant consolidation of the proximal humerus fracture.  No change in overall position alignment.  Shoulder is well located.    PMFS History: Patient Active Problem List   Diagnosis Date Noted  . CDNH (chondrodermatitis nodularis helicis), right 40/34/7425  . Impacted cerumen of left ear 03/04/2016  . Fecal incontinence 04/26/2012  . Lumbar stenosis 02/08/2011   Past Medical History:  Diagnosis Date  . Arthritis   . Headache(784.0)   . HTN (hypertension)   . Osteoporosis   . Raynaud disease     Family History  Problem Relation Age of Onset  . Diabetes Mother   . Arthritis Mother   . Heart disease Father 12       Heart attack,  blood clot to the leg  . Stroke Father   . Clotting disorder Son     Past Surgical History:  Procedure Laterality Date  . ABDOMINAL HYSTERECTOMY    . BACK SURGERY    . TONSILLECTOMY    . under chin cyst removed    . WRIST GANGLION EXCISION     right hand   Social History   Occupational History  . Not on file  Tobacco Use  . Smoking status: Never Smoker  . Smokeless tobacco: Never Used  Substance and Sexual Activity  . Alcohol use: No  . Drug use: No  . Sexual activity: Not on file

## 2020-04-14 NOTE — Addendum Note (Signed)
Addended by: Robyne Peers on: 04/14/2020 12:44 PM   Modules accepted: Orders

## 2020-04-29 ENCOUNTER — Other Ambulatory Visit: Payer: Self-pay

## 2020-04-29 ENCOUNTER — Ambulatory Visit: Payer: Medicare Other | Attending: Physician Assistant

## 2020-04-29 DIAGNOSIS — R262 Difficulty in walking, not elsewhere classified: Secondary | ICD-10-CM | POA: Insufficient documentation

## 2020-04-29 DIAGNOSIS — M25512 Pain in left shoulder: Secondary | ICD-10-CM | POA: Diagnosis not present

## 2020-04-29 DIAGNOSIS — R293 Abnormal posture: Secondary | ICD-10-CM | POA: Diagnosis not present

## 2020-04-29 DIAGNOSIS — M6281 Muscle weakness (generalized): Secondary | ICD-10-CM | POA: Diagnosis not present

## 2020-04-29 NOTE — Patient Instructions (Signed)
Access Code: [email or web address] URL: [email or web address] Date: 04/29/2020 Prepared by: Kathreen Cornfield  Exercises Seated Scapular Retraction - 2 x daily - 7 x weekly - 1-2 sets - 10 reps

## 2020-04-29 NOTE — Therapy (Signed)
[place], [place], [address] Phone: [phone]   Fax:  [phone]  Physical Therapy Evaluation  Patient Details  Name: Danielle Hernandez MRN: [id number] Date of Birth: 1935-02-04 Referring Provider (PT): Erskine Emery, PA-C   Encounter Date: 04/29/2020   PT End of Session - 04/29/20 1332    Visit Number 1    Number of Visits 16    Date for PT Re-Evaluation 06/24/20    Authorization Type [employer] Medicare    Authorization Time Period FOTO visit #6 and #10, [place] Modifier visit 15    Progress Note Due on Visit 10    PT Start Time 1015    PT Stop Time 1100    PT Time Calculation (min) 45 min    Activity Tolerance Patient tolerated treatment well;Patient limited by pain    Behavior During Therapy [facility] for tasks assessed/performed           Past Medical History:  Diagnosis Date  . Arthritis   . Headache(784.0)   . HTN (hypertension)   . Osteoporosis   . Raynaud disease     Past Surgical History:  Procedure Laterality Date  . ABDOMINAL HYSTERECTOMY    . BACK SURGERY    . TONSILLECTOMY    . under chin cyst removed    . WRIST GANGLION EXCISION     right hand    There were no vitals filed for this visit.    Subjective Assessment - 04/29/20 1022    Subjective Pt reports she fell on 03/05/20, fracturing her humerus. She isn't sure how she fell, but missed a meeting at church and a friend checked on her. Pt is having trouble donning/doffing shirt - normally doesn't wear a bra today but did and is wearing her jacket. She brings sling in, but doesn't think she is supposed to wear it anymore. When she sleeps on her bed on her back, her left leg has a terrible time moving but feels ok when she gets up. She is normally a right side sleeper, but it hurts her left shoulder. She has a girl staying with her at night and daughter helps with food and cleaning. Pt has a life alert she wears 24/7 as well.    Limitations  Lifting;House hold activities    Diagnostic tests XR: Significant consolidation of the proximal humerus fracture. No change in overall position alignment.  Shoulder is well  located.    Patient Stated Goals Be able to get my clothes on, get the pain to go away    Currently in Pain? Yes    Pain Score 5     Pain Location Shoulder    Pain Orientation Left    Pain Descriptors / Indicators Aching    Pain Type Acute pain    Pain Radiating Towards elbow    Pain Onset More than a month ago    Pain Frequency Intermittent    Aggravating Factors  can't sleep on it, don/doff shirt, sleeping on it    Pain Relieving Factors Tylenol (makes her sleepy)              [facility] PT Assessment - 04/29/20 0001      Assessment   Medical Diagnosis Left shoulder pain, unspecified chronicity; Closed fracture of proximal end of left humerus with routine healing, unspecified fracture morphology, subsequent encounter    Referring Provider (PT) Erskine Emery, PA-C    Onset Date/Surgical Date 03/05/20    Hand Dominance Right  Next MD Visit 05/12/20    Prior Therapy "I think a long time ago, don't remember why"      Precautions   Precautions Shoulder    Precaution Comments Per referral: GO SLOW with ROM and strength    Required Braces or Orthoses Sling   unclear whether it's as needed or all the time     Restrictions   Weight Bearing Restrictions No      Balance Screen   Has the patient fallen in the past 6 months Yes   Pt has a life alert   How many times? 2      [place] residence    Living Arrangements Alone    Available Help at Discharge Friend(s);Family    Type of [place] to enter    Entrance Stairs-Number of Steps 2    Entrance Stairs-Rails Can reach both    Home Layout One level      Prior Function   Level of Independence Needs assistance with ADLs;Needs assistance with homemaking    Comments Daughter is helping pt bathe, pt able  to don bra for eval, wore only zip up jacket due to difficulty with shirt      Observation/Other Assessments   Focus on Therapeutic Outcomes (FOTO)  36% ability, 58% predicted    Other Surveys  Fear Avoidance Belief Questionairre (FABQ)    Fear Avoidance Belief Questionnaire (FABQ)  57.1      Posture/Postural Control   Posture Comments frail, forward rounded shoulders, increased thoracic kyphosis      ROM / Strength   AROM / PROM / Strength AROM;PROM;Strength      AROM   Overall AROM Comments NT      PROM   Overall PROM Comments guarded and painful    PROM Assessment Site Shoulder    Right/Left Shoulder Left    Left Shoulder Flexion 80 Degrees   pain under armpit   Left Shoulder ABduction 60 Degrees   pain in anterior shoulder to deltoid   Left Shoulder Internal Rotation 70 Degrees    Left Shoulder External Rotation 20 Degrees      Strength   Overall Strength Comments NT      Palpation   Palpation comment TTP around shoulder, guarded with gentle PROM                      Objective measurements completed on examination: See above findings.       [facility] Adult PT Treatment/Exercise - 04/29/20 0001      Exercises   Exercises Shoulder      Shoulder Exercises: Seated   Retraction 10 reps                  PT Education - 04/29/20 1332    Education Details Diagnosis, Prognosis, FOTO, HEP, POC    Person(s) Educated Patient    Methods Explanation;Demonstration;Tactile cues;Verbal cues;Handout    Comprehension Verbalized understanding;Returned demonstration;Verbal cues required;Tactile cues required;Need further instruction            PT Short Term Goals - 04/29/20 1343      PT SHORT TERM GOAL #1   Title Pt will be I and compliant with initial HEP.    Baseline provided at eval    Time 3    Period Weeks    Status New    Target Date 05/20/20      PT SHORT TERM  GOAL #2   Title Pt will increase L shoulder PROM to [facility] with </= 4/10 pain.     Baseline very guarded, limited    Time 4    Period Weeks    Status New    Target Date 05/27/20      PT SHORT TERM GOAL #3   Title Pt will initiate AROM of shoulder, demonstrating ability to reach to shoulder height to improve ADL participation for bathing and grooming .    Baseline NT    Time 4    Period Weeks    Status New    Target Date 05/27/20             PT Long Term Goals - 04/29/20 1344      PT LONG TERM GOAL #1   Title Pt will be independent with advanced HEP for maintenance.    Time 8    Period Weeks    Status New    Target Date 06/24/20      PT LONG TERM GOAL #2   Title Pt will demonstrate AROM WFL, in order to increase independence with all ADLs (don/doff bra, fix hair), and IADLs (meal prep, moderate cleaning).    Baseline needs help with bathing, all meal prep and cleaning (household mgmt)    Time 8    Period Weeks    Status New    Target Date 06/24/20      PT LONG TERM GOAL #3   Title Pt will demonstrate at least 4/5 L shoulder MMT without pain.    Baseline NT    Time 8    Period Weeks    Status New    Target Date 06/24/20      PT LONG TERM GOAL #4   Title Pt will increase FOTO ability to at least 58% ability, in order to demonstrate meaningful change in perceived level of functional ability.    Baseline 36% ability    Time 8    Period Weeks    Status New    Target Date 06/24/20                  Plan - 04/29/20 1333    Clinical Impression Statement Pt is an 85 yo female who fell for unknown reason on 03/05/20 in her home, fracturing her L proximal humerus. Pt has since not used her L arm, intermittently been in a sling, and needs help for ADLs including bathing and dressing. Pt has someone staying with her at night now until early May, wears a Life Alert 24/7, and her daughter helps with meals, cleaning, and baths. Pt otherwise lives alone. Pt has fallen x2 in last 6 months and reports she does not remember why. Pt is a frail lady with  osteoporosis, very limited/guarded/painful L shoulder PROM, strength and AROM NT, and difficulty performing I/ADLs. She presents with zip up jacket only and wearing a bra, reporting "I only wore this bra for therapy today". Pt's goals are to dress herself and reduce shoulder pain. XR show significant consolidation of proximal humerus fracture. Per referral, provider would like for to go slow with therapy to focus on strength and ROM. Pt was educated on diagnosis, prognosis, POC, HEP and FOTO, verbalizing understanding and consent to tx. She would benefit from skilled PT 2x/week for 8 weeks to address deficits in L shoulder ROM and strength to return to independent I/ADLs.    Personal Factors and Comorbidities Age;Education;Time since onset of injury/illness/exacerbation;Fitness;Comorbidity 3+  Comorbidities Arthritis, Osteoporosis, HTN, HOH    Examination-Activity Limitations Bathing;Dressing;Reach Overhead    Examination-Participation Restrictions Cleaning;Laundry;Meal Prep;Shop;Community Activity    Stability/Clinical Decision Making Evolving/Moderate complexity    Clinical Decision Making Moderate    Rehab Potential Good    PT Frequency 2x / week    PT Duration 8 weeks    PT Treatment/Interventions ADLs/Self Care Home Management;Aquatic Therapy;Cryotherapy;Electrical Stimulation;Iontophoresis 4mg /ml Dexamethasone;Moist Heat;Balance training;Gait training;Therapeutic activities;Functional mobility training;Therapeutic exercise;Neuromuscular re-education;Patient/family education;Manual techniques;Passive range of motion;Dry needling;Taping;Vasopneumatic Device    PT Next Visit Plan Assess response to HEP and add to it, P/AAROM of shoulder, initiate gentle strengthening as tolerated, postural endurance/strength    PT Home Exercise Plan [email or web address]: Seated Scapular Retraction    Consulted and Agree with Plan of Care Patient           Patient will benefit from skilled therapeutic intervention in  order to improve the following deficits and impairments:  Decreased balance,Decreased mobility,Decreased activity tolerance,Decreased strength,Increased fascial restricitons,Impaired flexibility,Impaired UE functional use,Postural dysfunction,Pain,Improper body mechanics,Impaired tone,Impaired perceived functional ability,Decreased range of motion  Visit Diagnosis: Acute pain of left shoulder  Abnormal posture  Muscle weakness (generalized)  Difficulty in walking, not elsewhere classified     Problem List Patient Active Problem List   Diagnosis Date Noted  . CDNH (chondrodermatitis nodularis helicis), right 91/69/4503  . Impacted cerumen of left ear 03/04/2016  . Fecal incontinence 04/26/2012  . Lumbar stenosis 02/08/2011    Danielle Hernandez, PT, DPT 04/29/2020, 1:50 PM  [facility] [address] [place], [place], [address] Phone: [phone]   Fax:  [phone]  Name: Danielle Hernandez MRN: [id number] Date of Birth: Sep 18, 1935

## 2020-05-08 ENCOUNTER — Ambulatory Visit: Payer: Medicare Other | Admitting: Physician Assistant

## 2020-05-08 ENCOUNTER — Encounter: Payer: Self-pay | Admitting: Physician Assistant

## 2020-05-08 DIAGNOSIS — S42202D Unspecified fracture of upper end of left humerus, subsequent encounter for fracture with routine healing: Secondary | ICD-10-CM

## 2020-05-08 NOTE — Progress Notes (Signed)
Office Visit Note   Patient: Danielle Hernandez           Date of Birth: [birth year]           MRN: [id number] Visit Date: 05/08/2020              Requested by: Danielle Neer, MD [address] [place],  [place] [address] PCP: Danielle Neer, MD   Assessment & Plan: Visit Diagnoses:  1. Closed fracture of proximal end of left humerus with routine healing, unspecified fracture morphology, subsequent encounter     Plan: Recommend no lifting greater than 10 pounds with the left arm.  She will work on forward flexion exercises, supination exercises and try to obtain an overhead pulley to work on overhead exercises left shoulder.  She is given a prescription for bridge for physical therapy she will try to get into them sooner if they can accommodate her so they can begin working on range of motion of the left shoulder.  Like to see her back in 4 weeks.  Questions encouraged and answered both the patient and her daughter.  Follow-Up Instructions: Return in about 4 weeks (around 06/05/2020).   Orders:  No orders of the defined types were placed in this encounter.  No orders of the defined types were placed in this encounter.     Procedures: No procedures performed   Clinical Data: No additional findings.   Subjective: Chief Complaint  Patient presents with  . Left Shoulder - Follow-up    HPI Mrs. Danielle Hernandez returns today now 10 weeks status post left proximal humerus fracture.  She is overall doing well she is still having some pain.  She has not started physical therapy yet.  She states that therapy was not available until the end of the month which is another 2 weeks off.  No new injury.  Review of Systems See HPI otherwise negative  Objective: Vital Signs: There were no vitals taken for this visit.  Physical Exam Constitutional:      Appearance: She is not ill-appearing or diaphoretic.  Pulmonary:     Effort: Pulmonary effort is normal.  Neurological:      Mental Status: She is alert and oriented to person, place, and time.  Psychiatric:        Mood and Affect: Mood normal.     Ortho Exam Overhead activity full on the right left 95 degrees.  Abduction left shoulder 90 degrees actively and passively.  Lacks last few degrees in full extension of the elbow and last 10 degrees and full supination of the left hand.  Specialty Comments:  No specialty comments available.  Imaging: No results found.   PMFS History: Patient Active Problem List   Diagnosis Date Noted  . CDNH (chondrodermatitis nodularis helicis), right 09/38/1829  . Impacted cerumen of left ear 03/04/2016  . Fecal incontinence 04/26/2012  . Lumbar stenosis 02/08/2011   Past Medical History:  Diagnosis Date  . Arthritis   . Headache(784.0)   . HTN (hypertension)   . Osteoporosis   . Raynaud disease     Family History  Problem Relation Age of Onset  . Diabetes Mother   . Arthritis Mother   . Heart disease Father 94       Heart attack, blood clot to the leg  . Stroke Father   . Clotting disorder Son     Past Surgical History:  Procedure Laterality Date  . ABDOMINAL HYSTERECTOMY    . BACK SURGERY    .  TONSILLECTOMY    . under chin cyst removed    . WRIST GANGLION EXCISION     right hand   Social History   Occupational History  . Not on file  Tobacco Use  . Smoking status: Never Smoker  . Smokeless tobacco: Never Used  Substance and Sexual Activity  . Alcohol use: No  . Drug use: No  . Sexual activity: Not on file

## 2020-05-12 ENCOUNTER — Ambulatory Visit: Payer: Medicare Other | Admitting: Orthopaedic Surgery

## 2020-05-20 ENCOUNTER — Other Ambulatory Visit: Payer: Self-pay

## 2020-05-20 ENCOUNTER — Ambulatory Visit: Payer: Medicare Other

## 2020-05-20 DIAGNOSIS — R293 Abnormal posture: Secondary | ICD-10-CM

## 2020-05-20 DIAGNOSIS — M25512 Pain in left shoulder: Secondary | ICD-10-CM

## 2020-05-20 DIAGNOSIS — M6281 Muscle weakness (generalized): Secondary | ICD-10-CM | POA: Diagnosis not present

## 2020-05-20 DIAGNOSIS — R262 Difficulty in walking, not elsewhere classified: Secondary | ICD-10-CM

## 2020-05-20 NOTE — Therapy (Signed)
[place], [place], [address] Phone: [phone]   Fax:  [phone]  Physical Therapy Treatment  Patient Details  Name: Danielle Hernandez MRN: [id number] Date of Birth: 19-Jun-1935 Referring Provider (PT): Erskine Emery, PA-C   Encounter Date: 05/20/2020   PT End of Session - 05/20/20 1025    Visit Number 2    Number of Visits 16    Date for PT Re-Evaluation 06/24/20    Authorization Type U[employer] Medicare    Authorization Time Period FOTO visit #6 and #10, [place] Modifier visit 15    Progress Note Due on Visit 10    PT Start Time 1015    PT Stop Time 1100    PT Time Calculation (min) 45 min    Activity Tolerance Patient tolerated treatment well;Patient limited by pain    Behavior During Therapy [facility] for tasks assessed/performed           Past Medical History:  Diagnosis Date  . Arthritis   . Headache(784.0)   . HTN (hypertension)   . Osteoporosis   . Raynaud disease     Past Surgical History:  Procedure Laterality Date  . ABDOMINAL HYSTERECTOMY    . BACK SURGERY    . TONSILLECTOMY    . under chin cyst removed    . WRIST GANGLION EXCISION     right hand    There were no vitals filed for this visit.   Subjective Assessment - 05/20/20 1023    Subjective Pt reports she is getting her baths now, but still has someone else in the house for fear of falling and being on the floor all day. Her shoulder is really sore and hurts more when she does exercises.    Limitations Lifting;House hold activities    Diagnostic tests XR: Significant consolidation of the proximal humerus fracture. No change in overall position alignment.  Shoulder is well  located.    Patient Stated Goals Be able to get my clothes on, get the pain to go away    Currently in Pain? Yes    Pain Score 7     Pain Location Shoulder    Pain Orientation Left    Pain Descriptors / Indicators Aching;Sore    Pain Type Acute pain    Pain Radiating  Towards elbow    Pain Onset More than a month ago                             [facility] Adult PT Treatment/Exercise - 05/20/20 0001      Self-Care   Self-Care Other Self-Care Comments;Posture    Posture verbal cues for importance of upright posture with pulleys to position shoulder optimally and decrease pain    Other Self-Care Comments  see pt edu      Shoulder Exercises: Supine   Flexion AAROM;Left;12 reps    Flexion Limitations hold 3 seconds      Shoulder Exercises: Seated   Retraction 10 reps      Shoulder Exercises: Pulleys   Flexion 2 minutes    Flexion Limitations small ROM due to pain    Scaption 2 minutes    Scaption Limitations small ROM due to pain      Manual Therapy   Manual Therapy Joint mobilization;Soft tissue mobilization;Passive ROM    Manual therapy comments supine    Joint Mobilization grade 2-3 inf/post GJH mobs    Soft tissue mobilization STM  to L UT, rhomboids, mid trap, biceps    Passive ROM gentle PROM into L shoulder flexion, small range into ER/IR and abduction to 90, gentle low load stretch into L elbow extension                  PT Education - 05/20/20 1119    Education Details to perform pulleys and supine AAROM flexion to tolerance, not to intense pain    Person(s) Educated Patient    Methods Explanation;Demonstration;Tactile cues;Verbal cues;Handout    Comprehension Verbalized understanding;Returned demonstration;Verbal cues required;Tactile cues required;Need further instruction            PT Short Term Goals - 04/29/20 1343      PT SHORT TERM GOAL #1   Title Pt will be I and compliant with initial HEP.    Baseline provided at eval    Time 3    Period Weeks    Status New    Target Date 05/20/20      PT SHORT TERM GOAL #2   Title Pt will increase L shoulder PROM to [facility] with </= 4/10 pain.    Baseline very guarded, limited    Time 4    Period Weeks    Status New    Target Date 05/27/20      PT SHORT  TERM GOAL #3   Title Pt will initiate AROM of shoulder, demonstrating ability to reach to shoulder height to improve ADL participation for bathing and grooming .    Baseline NT    Time 4    Period Weeks    Status New    Target Date 05/27/20             PT Long Term Goals - 05/20/20 1039      PT LONG TERM GOAL #1   Title Pt will be independent with advanced HEP for maintenance.    Time 8    Period Weeks    Status New      PT LONG TERM GOAL #2   Title Pt will demonstrate AROM WFL, in order to increase independence with all ADLs (don/doff bra, fix hair), and IADLs (meal prep, moderate cleaning).    Baseline needs help with bathing, all meal prep and cleaning (household mgmt)    Time 8    Period Weeks    Status New      PT LONG TERM GOAL #3   Title Pt will demonstrate at least 4/5 L shoulder MMT without pain.    Baseline NT    Time 8    Period Weeks    Status New      PT LONG TERM GOAL #4   Title Pt will increase FOTO ability to at least 58% ability, in order to demonstrate meaningful change in perceived level of functional ability.    Baseline 36% ability    Time 8    Period Weeks    Status New      PT LONG TERM GOAL #5   Title Pt will be able to use curling iron to do her hair.    Baseline unable to use    Time 5    Period Weeks    Status New    Target Date 06/24/20                 Plan - 05/20/20 1026    Clinical Impression Statement Pt continues to have higher pain level with increased motion, though she does demonstrate AROM flexion  to 90 today with L shoulder hike. Pt has been performing pulleys several times per day and was advised to cut back on that if pain level is very high afterward and also to decrease ROM to tolerance as well. Pt states during session she really wants to be able to curl her hair, so LTG added for ability to curl hair. She tolerated manual therapy well and initiation of supine AAROM shoulder flexion. Will continue to progress  shoulder ROM as tolerated.    Personal Factors and Comorbidities Age;Education;Time since onset of injury/illness/exacerbation;Fitness;Comorbidity 3+    Comorbidities Arthritis, Osteoporosis, HTN, HOH    Examination-Activity Limitations Bathing;Dressing;Reach Overhead    Examination-Participation Restrictions Cleaning;Laundry;Meal Prep;Shop;Community Activity    Stability/Clinical Decision Making Evolving/Moderate complexity    Rehab Potential Good    PT Frequency 2x / week    PT Duration 8 weeks    PT Treatment/Interventions ADLs/Self Care Home Management;Aquatic Therapy;Cryotherapy;Electrical Stimulation;Iontophoresis 4mg /ml Dexamethasone;Moist Heat;Balance training;Gait training;Therapeutic activities;Functional mobility training;Therapeutic exercise;Neuromuscular re-education;Patient/family education;Manual techniques;Passive range of motion;Dry needling;Taping;Vasopneumatic Device    PT Next Visit Plan Assess response to HEP and add to it, P/AAROM of shoulder, initiate gentle strengthening as tolerated, postural endurance/strength    PT Home Exercise Plan [email or web address]: Seated Scapular Retraction    Consulted and Agree with Plan of Care Patient           Patient will benefit from skilled therapeutic intervention in order to improve the following deficits and impairments:  Decreased balance,Decreased mobility,Decreased activity tolerance,Decreased strength,Increased fascial restricitons,Impaired flexibility,Impaired UE functional use,Postural dysfunction,Pain,Improper body mechanics,Impaired tone,Impaired perceived functional ability,Decreased range of motion  Visit Diagnosis: Acute pain of left shoulder  Abnormal posture  Muscle weakness (generalized)  Difficulty in walking, not elsewhere classified     Problem List Patient Active Problem List   Diagnosis Date Noted  . CDNH (chondrodermatitis nodularis helicis), right 38/75/6433  . Impacted cerumen of left ear 03/04/2016  . Fecal  incontinence 04/26/2012  . Lumbar stenosis 02/08/2011    Izell [place], PT, DPT 05/20/2020, 11:29 AM  [facility] [address] [place], [place], [address] Phone: [phone]   Fax:  [phone]  Name: Danielle Hernandez MRN: [id number] Date of Birth: 05-29-1935

## 2020-05-22 ENCOUNTER — Other Ambulatory Visit: Payer: Self-pay

## 2020-05-22 ENCOUNTER — Ambulatory Visit: Payer: Medicare Other

## 2020-05-22 DIAGNOSIS — M6281 Muscle weakness (generalized): Secondary | ICD-10-CM

## 2020-05-22 DIAGNOSIS — R262 Difficulty in walking, not elsewhere classified: Secondary | ICD-10-CM | POA: Diagnosis not present

## 2020-05-22 DIAGNOSIS — R293 Abnormal posture: Secondary | ICD-10-CM | POA: Diagnosis not present

## 2020-05-22 DIAGNOSIS — M25512 Pain in left shoulder: Secondary | ICD-10-CM | POA: Diagnosis not present

## 2020-05-22 NOTE — Therapy (Signed)
[place], [place], [address] Phone: [phone]   Fax:  [phone]  Physical Therapy Treatment  Patient Details  Name: Danielle Hernandez MRN: [id number] Date of Birth: 1935-10-17 Referring Provider (PT): Erskine Emery, PA-C   Encounter Date: 05/22/2020   PT End of Session - 05/22/20 1020    Visit Number 3    Number of Visits 16    Date for PT Re-Evaluation 06/24/20    Authorization Type UHC Medicare    Authorization Time Period FOTO visit #6 and #10, [place] Modifier visit 15    Progress Note Due on Visit 10    PT Start Time 1015    PT Stop Time 1100    PT Time Calculation (min) 45 min    Activity Tolerance Patient tolerated treatment well;Patient limited by pain    Behavior During Therapy [facility] for tasks assessed/performed           Past Medical History:  Diagnosis Date  . Arthritis   . Headache(784.0)   . HTN (hypertension)   . Osteoporosis   . Raynaud disease     Past Surgical History:  Procedure Laterality Date  . ABDOMINAL HYSTERECTOMY    . BACK SURGERY    . TONSILLECTOMY    . under chin cyst removed    . WRIST GANGLION EXCISION     right hand    There were no vitals filed for this visit.   Subjective Assessment - 05/22/20 1018    Subjective Pt reports she ididn't think she would have any pain after therapy, but did feel some. She can only take Tylenol, which she thinks makes her a little sleepy. "I go to pain management and had to cancel last time and am not sure if I should make another one".    Limitations Lifting;House hold activities    Diagnostic tests XR: Significant consolidation of the proximal humerus fracture. No change in overall position alignment.  Shoulder is well  located.    Patient Stated Goals Be able to get my clothes on, get the pain to go away    Currently in Pain? Yes    Pain Score 5     Pain Location Shoulder    Pain Orientation Left    Pain Descriptors /  Indicators Aching;Sore    Pain Type Acute pain    Pain Onset More than a month ago              [facility] PT Assessment - 05/22/20 0001      PROM   Overall PROM Comments in supine, guarded and painful    Left Shoulder Flexion 87 Degrees    Left Shoulder ABduction 60 Degrees    Left Shoulder Internal Rotation 72 Degrees    Left Shoulder External Rotation 20 Degrees                         [facility] Adult PT Treatment/Exercise - 05/22/20 0001      Shoulder Exercises: Supine   Flexion AAROM;Left;12 reps    Flexion Limitations hold 5 seconds      Shoulder Exercises: Seated   Retraction 10 reps      Shoulder Exercises: Stretch   Table Stretch -Flexion Limitations 10 reps    Table Stretch - ABduction Limitations scaption 10 reps with manual positioning    Table Stretch - External Rotation Limitations 10 reps      Manual Therapy   Manual Therapy  Joint mobilization;Soft tissue mobilization;Passive ROM    Manual therapy comments supine    Joint Mobilization grade 2-3 inf/post GJH mobs    Soft tissue mobilization STM to L UT, rhomboids, mid trap, biceps    Passive ROM gentle PROM into L shoulder flexion, small range into ER/IR and abduction to 90, assist with table slides                    PT Short Term Goals - 04/29/20 1343      PT SHORT TERM GOAL #1   Title Pt will be I and compliant with initial HEP.    Baseline provided at eval    Time 3    Period Weeks    Status New    Target Date 05/20/20      PT SHORT TERM GOAL #2   Title Pt will increase L shoulder PROM to [facility] with </= 4/10 pain.    Baseline very guarded, limited    Time 4    Period Weeks    Status New    Target Date 05/27/20      PT SHORT TERM GOAL #3   Title Pt will initiate AROM of shoulder, demonstrating ability to reach to shoulder height to improve ADL participation for bathing and grooming .    Baseline NT    Time 4    Period Weeks    Status New    Target Date 05/27/20              PT Long Term Goals - 05/20/20 1039      PT LONG TERM GOAL #1   Title Pt will be independent with advanced HEP for maintenance.    Time 8    Period Weeks    Status New      PT LONG TERM GOAL #2   Title Pt will demonstrate AROM WFL, in order to increase independence with all ADLs (don/doff bra, fix hair), and IADLs (meal prep, moderate cleaning).    Baseline needs help with bathing, all meal prep and cleaning (household mgmt)    Time 8    Period Weeks    Status New      PT LONG TERM GOAL #3   Title Pt will demonstrate at least 4/5 L shoulder MMT without pain.    Baseline NT    Time 8    Period Weeks    Status New      PT LONG TERM GOAL #4   Title Pt will increase FOTO ability to at least 58% ability, in order to demonstrate meaningful change in perceived level of functional ability.    Baseline 36% ability    Time 8    Period Weeks    Status New      PT LONG TERM GOAL #5   Title Pt will be able to use curling iron to do her hair.    Baseline unable to use    Time 5    Period Weeks    Status New    Target Date 06/24/20                 Plan - 05/22/20 1021    Clinical Impression Statement Pt presents with 5/10 pain today and increased flexion ROM (87 PROM pre-manual and 105 AAROM post manual). ER and abduction continue to be most limited and painful. Pt starts session with 60 deg abduction and ends with close to 90 with firm end feel and guarding. Initiated seated  flexion and abduction stretching at table to add to HEP for increased AAROM.    Personal Factors and Comorbidities Age;Education;Time since onset of injury/illness/exacerbation;Fitness;Comorbidity 3+    Comorbidities Arthritis, Osteoporosis, HTN, HOH    Examination-Activity Limitations Bathing;Dressing;Reach Overhead    Examination-Participation Restrictions Cleaning;Laundry;Meal Prep;Shop;Community Activity    Stability/Clinical Decision Making Evolving/Moderate complexity    Rehab Potential  Good    PT Frequency 2x / week    PT Duration 8 weeks    PT Treatment/Interventions ADLs/Self Care Home Management;Aquatic Therapy;Cryotherapy;Electrical Stimulation;Iontophoresis 4mg /ml Dexamethasone;Moist Heat;Balance training;Gait training;Therapeutic activities;Functional mobility training;Therapeutic exercise;Neuromuscular re-education;Patient/family education;Manual techniques;Passive range of motion;Dry needling;Taping;Vasopneumatic Device    PT Next Visit Plan Assess response to HEP and add to it, P/AAROM of shoulder, initiate gentle strengthening as tolerated, postural endurance/strength, manual therapy to address soft tissue and joint restrictions    PT Home Exercise Plan [email or web address]: Seated Scapular Retraction    Consulted and Agree with Plan of Care Patient           Patient will benefit from skilled therapeutic intervention in order to improve the following deficits and impairments:  Decreased balance,Decreased mobility,Decreased activity tolerance,Decreased strength,Increased fascial restricitons,Impaired flexibility,Impaired UE functional use,Postural dysfunction,Pain,Improper body mechanics,Impaired tone,Impaired perceived functional ability,Decreased range of motion  Visit Diagnosis: Acute pain of left shoulder  Abnormal posture  Muscle weakness (generalized)  Difficulty in walking, not elsewhere classified     Problem List Patient Active Problem List   Diagnosis Date Noted  . CDNH (chondrodermatitis nodularis helicis), right 32/20/2542  . Impacted cerumen of left ear 03/04/2016  . Fecal incontinence 04/26/2012  . Lumbar stenosis 02/08/2011    Izell [place], PT, DPT 05/22/2020, 3:28 PM  [facility] [address] [place], [place], [address] Phone: [phone]   Fax:  [phone]  Name: Danielle Hernandez MRN: [id number] Date of Birth: 01-01-1936

## 2020-05-22 NOTE — Patient Instructions (Signed)
Access Code: [email or web address] URL: [email or web address] Date: 05/22/2020 Prepared by: Kathreen Cornfield  Exercises Seated Scapular Retraction - 2 x daily - 7 x weekly - 1-2 sets - 10 reps Supine Shoulder Flexion AAROM - 2 x daily - 7 x weekly - 1-2 sets - 10 reps - 3-5 seconds hold Seated Shoulder Flexion Slide at Table Top with Forearm in Neutral - 2 x daily - 7 x weekly - 1-2 sets - 10 reps Seated Shoulder Scaption Slide at Table Top with Forearm in Neutral - 2 x daily - 7 x weekly - 1-2 sets - 10 reps Seated Shoulder External Rotation PROM on Table - 2 x daily - 7 x weekly - 1-2 sets - 10 reps

## 2020-05-27 ENCOUNTER — Other Ambulatory Visit: Payer: Self-pay

## 2020-05-27 ENCOUNTER — Ambulatory Visit: Payer: Medicare Other | Attending: Physician Assistant

## 2020-05-27 DIAGNOSIS — R293 Abnormal posture: Secondary | ICD-10-CM | POA: Insufficient documentation

## 2020-05-27 DIAGNOSIS — M25512 Pain in left shoulder: Secondary | ICD-10-CM

## 2020-05-27 DIAGNOSIS — M6281 Muscle weakness (generalized): Secondary | ICD-10-CM | POA: Insufficient documentation

## 2020-05-27 DIAGNOSIS — R262 Difficulty in walking, not elsewhere classified: Secondary | ICD-10-CM | POA: Diagnosis not present

## 2020-05-27 NOTE — Therapy (Signed)
[place], [place], [address] Phone: [phone]   Fax:  [phone]  Physical Therapy Treatment  Patient Details  Name: Danielle Hernandez MRN: [id number] Date of Birth: October 10, 1935 Referring Provider (PT): Erskine Emery, PA-C   Encounter Date: 05/27/2020   PT End of Session - 05/27/20 1325    Visit Number 4    Number of Visits 16    Date for PT Re-Evaluation 06/24/20    Authorization Type [employer] Medicare    Authorization Time Period FOTO visit #6 and #10, [place] Modifier visit 15    Progress Note Due on Visit 10    PT Start Time 1315    PT Stop Time 1400    PT Time Calculation (min) 45 min    Activity Tolerance Patient tolerated treatment well;Patient limited by pain    Behavior During Therapy [facility] for tasks assessed/performed           Past Medical History:  Diagnosis Date  . Arthritis   . Headache(784.0)   . HTN (hypertension)   . Osteoporosis   . Raynaud disease     Past Surgical History:  Procedure Laterality Date  . ABDOMINAL HYSTERECTOMY    . BACK SURGERY    . TONSILLECTOMY    . under chin cyst removed    . WRIST GANGLION EXCISION     right hand    There were no vitals filed for this visit.   Subjective Assessment - 05/27/20 1322    Subjective "You wore me out last time. It was painful afterward. I've been using the heating pad and taking 3 Tylenol 2x/day, but she wants to wean down to 1x/day".    Limitations Lifting;House hold activities    Diagnostic tests XR: Significant consolidation of the proximal humerus fracture. No change in overall position alignment.  Shoulder is well  located.    Patient Stated Goals Be able to get my clothes on, get the pain to go away    Currently in Pain? Yes    Pain Score 7    no apparent distress   Pain Location Shoulder    Pain Orientation Left;Posterior    Pain Descriptors / Indicators Aching;Sore    Pain Type Acute pain    Pain Onset More than a month  ago              [facility] PT Assessment - 05/27/20 0001      AROM   Overall AROM Comments pain    AROM Assessment Site Shoulder    Right/Left Shoulder Left    Left Shoulder Flexion 91 Degrees    Left Shoulder ABduction 66 Degrees    Left Shoulder Internal Rotation --   L4   Left Shoulder External Rotation --   C6     PROM   Left Shoulder Flexion 93 Degrees   pre-manual   Left Shoulder ABduction 73 Degrees   pre-manual   Left Shoulder External Rotation 22 Degrees                         [facility] Adult PT Treatment/Exercise - 05/27/20 0001      Shoulder Exercises: Seated   Retraction 10 reps      Shoulder Exercises: Pulleys   Flexion 3 minutes    Scaption 3 minutes      Shoulder Exercises: Stretch   Table Stretch -Flexion Limitations 10 reps with pink FR  PT Education - 05/27/20 1355    Education Details PNE, sit down to do pulleys at home, as ROM increases pain should start to diminish, while pt remains stiff and guarded pain will persist    Person(s) Educated Patient    Methods Explanation;Demonstration;Tactile cues    Comprehension Need further instruction;Verbalized understanding;Verbal cues required            PT Short Term Goals - 04/29/20 1343      PT SHORT TERM GOAL #1   Title Pt will be I and compliant with initial HEP.    Baseline provided at eval    Time 3    Period Weeks    Status New    Target Date 05/20/20      PT SHORT TERM GOAL #2   Title Pt will increase L shoulder PROM to [facility] with </= 4/10 pain.    Baseline very guarded, limited    Time 4    Period Weeks    Status New    Target Date 05/27/20      PT SHORT TERM GOAL #3   Title Pt will initiate AROM of shoulder, demonstrating ability to reach to shoulder height to improve ADL participation for bathing and grooming .    Baseline NT    Time 4    Period Weeks    Status New    Target Date 05/27/20             PT Long Term Goals - 05/20/20 1039       PT LONG TERM GOAL #1   Title Pt will be independent with advanced HEP for maintenance.    Time 8    Period Weeks    Status New      PT LONG TERM GOAL #2   Title Pt will demonstrate AROM WFL, in order to increase independence with all ADLs (don/doff bra, fix hair), and IADLs (meal prep, moderate cleaning).    Baseline needs help with bathing, all meal prep and cleaning (household mgmt)    Time 8    Period Weeks    Status New      PT LONG TERM GOAL #3   Title Pt will demonstrate at least 4/5 L shoulder MMT without pain.    Baseline NT    Time 8    Period Weeks    Status New      PT LONG TERM GOAL #4   Title Pt will increase FOTO ability to at least 58% ability, in order to demonstrate meaningful change in perceived level of functional ability.    Baseline 36% ability    Time 8    Period Weeks    Status New      PT LONG TERM GOAL #5   Title Pt will be able to use curling iron to do her hair.    Baseline unable to use    Time 5    Period Weeks    Status New    Target Date 06/24/20                 Plan - 05/27/20 1325    Clinical Impression Statement Pt had to take 2 restroom breaks decreasing session time. Pt had good carryover of flexion ROM today and increased abduction PROM pre-manual therapy. She has poor pain tolerance and guards shoulder with most exercise and manual therapy. Pt heavily educated each session on importance of continuing to stretch shoulder despite pain and that pain should decrease with increased  ROM. Carryover of understanding has been limited thus far, but pt agrees during session and tries to push through pain. She verbalizes consistently her fear of a frozen shoulder.    Personal Factors and Comorbidities Age;Education;Time since onset of injury/illness/exacerbation;Fitness;Comorbidity 3+    Comorbidities Arthritis, Osteoporosis, HTN, HOH    Examination-Activity Limitations Bathing;Dressing;Reach Overhead    Examination-Participation  Restrictions Cleaning;Laundry;Meal Prep;Shop;Community Activity    Stability/Clinical Decision Making Evolving/Moderate complexity    Rehab Potential Good    PT Frequency 2x / week    PT Duration 8 weeks    PT Treatment/Interventions ADLs/Self Care Home Management;Aquatic Therapy;Cryotherapy;Electrical Stimulation;Iontophoresis 4mg /ml Dexamethasone;Moist Heat;Balance training;Gait training;Therapeutic activities;Functional mobility training;Therapeutic exercise;Neuromuscular re-education;Patient/family education;Manual techniques;Passive range of motion;Dry needling;Taping;Vasopneumatic Device    PT Next Visit Plan Assess response to HEP/update PRN, P/AAROM of shoulder to tolerance, gentle strengthening as tolerated, postural endurance/strength, manual therapy to address soft tissue and joint restrictions    PT Home Exercise Plan [email or web address]: Seated Scapular Retraction    Consulted and Agree with Plan of Care Patient           Patient will benefit from skilled therapeutic intervention in order to improve the following deficits and impairments:  Decreased balance,Decreased mobility,Decreased activity tolerance,Decreased strength,Increased fascial restricitons,Impaired flexibility,Impaired UE functional use,Postural dysfunction,Pain,Improper body mechanics,Impaired tone,Impaired perceived functional ability,Decreased range of motion  Visit Diagnosis: Acute pain of left shoulder  Abnormal posture  Muscle weakness (generalized)  Difficulty in walking, not elsewhere classified     Problem List Patient Active Problem List   Diagnosis Date Noted  . CDNH (chondrodermatitis nodularis helicis), right 85/46/2703  . Impacted cerumen of left ear 03/04/2016  . Fecal incontinence 04/26/2012  . Lumbar stenosis 02/08/2011    Izell [place]and, PT, DPT 05/27/2020, 3:10 PM  [facility] [address] [place], [place], [address] Phone: [phone]    Fax:  [phone]  Name: Danielle Hernandez MRN: [id number] Date of Birth: 07/01/1935

## 2020-05-29 ENCOUNTER — Other Ambulatory Visit: Payer: Self-pay

## 2020-05-29 ENCOUNTER — Ambulatory Visit: Payer: Medicare Other

## 2020-05-29 DIAGNOSIS — R293 Abnormal posture: Secondary | ICD-10-CM

## 2020-05-29 DIAGNOSIS — R262 Difficulty in walking, not elsewhere classified: Secondary | ICD-10-CM

## 2020-05-29 DIAGNOSIS — M6281 Muscle weakness (generalized): Secondary | ICD-10-CM

## 2020-05-29 DIAGNOSIS — M25512 Pain in left shoulder: Secondary | ICD-10-CM

## 2020-05-29 NOTE — Therapy (Signed)
[place], [place], [address] Phone: [phone]   Fax:  [phone]  Physical Therapy Treatment  Patient Details  Name: Danielle Hernandez MRN: [id number] Date of Birth: 1936-01-22 Referring Provider (PT): Erskine Emery, PA-C   Encounter Date: 05/29/2020   PT End of Session - 05/29/20 1244    Visit Number 5    Number of Visits 16    Date for PT Re-Evaluation 06/24/20    Authorization Type UHC Medicare    Authorization Time Period FOTO visit #6 and #10, [place] Modifier visit 15    Progress Note Due on Visit 10    PT Start Time 1230    PT Stop Time 1315    PT Time Calculation (min) 45 min    Activity Tolerance Patient tolerated treatment well;Patient limited by pain    Behavior During Therapy [facility] for tasks assessed/performed           Past Medical History:  Diagnosis Date  . Arthritis   . Headache(784.0)   . HTN (hypertension)   . Osteoporosis   . Raynaud disease     Past Surgical History:  Procedure Laterality Date  . ABDOMINAL HYSTERECTOMY    . BACK SURGERY    . TONSILLECTOMY    . under chin cyst removed    . WRIST GANGLION EXCISION     right hand    There were no vitals filed for this visit.   Subjective Assessment - 05/29/20 1242    Subjective "I have pain all the time. 7/10 with movement, but 4/10 right now. I was able to sleep in my bed all night last night with a pillow between my knees."    Limitations Lifting;House hold activities    Diagnostic tests XR: Significant consolidation of the proximal humerus fracture. No change in overall position alignment.  Shoulder is well  located.    Patient Stated Goals Be able to get my clothes on, get the pain to go away    Currently in Pain? Yes    Pain Score 4     Pain Location Shoulder    Pain Orientation Left    Pain Descriptors / Indicators Aching;Sore    Pain Type Acute pain    Pain Radiating Towards elbow and wrist with movement    Pain  Onset More than a month ago              [facility] PT Assessment - 05/29/20 0001      PROM   Left Shoulder Flexion 106 Degrees   pre-manual, 124 post   Left Shoulder ABduction 74 Degrees   pre-manual   Left Shoulder External Rotation 24 Degrees   pre-manual                        [facility] Adult PT Treatment/Exercise - 05/29/20 0001      Self-Care   Other Self-Care Comments  see pt edu      Shoulder Exercises: Supine   Flexion AAROM;Left;5 reps    Flexion Limitations subscap release each time      Shoulder Exercises: Seated   Retraction 10 reps      Shoulder Exercises: Pulleys   Flexion 3 minutes    Scaption 3 minutes      Shoulder Exercises: Stretch   Other Shoulder Stretches standing ER at wall    Other Shoulder Stretches standing 60 pec S      Manual Therapy   Manual  Therapy Joint mobilization;Soft tissue mobilization;Passive ROM;Myofascial release    Manual therapy comments supine    Joint Mobilization grade 2-3 inf/post GJH mobs    Soft tissue mobilization STM to L UT, rhomboids, mid trap, biceps    Myofascial Release MFR to L subscap with pt's AAROM L shoulder flexion    Passive ROM PROM into L shoulder flexion, small range into ER and abduction to 90                  PT Education - 05/29/20 1459    Education Details Continue to focus on motion and doing HEP, added pec and ER at 0 abduction stretches to HEP    Person(s) Educated Patient    Methods Explanation;Verbal cues    Comprehension Verbalized understanding;Verbal cues required            PT Short Term Goals - 05/29/20 1505      PT SHORT TERM GOAL #1   Title Pt will be I and compliant with initial HEP.    Baseline mostly compliant    Time 3    Period Weeks    Status On-going    Target Date 05/20/20      PT SHORT TERM GOAL #2   Title Pt will increase L shoulder PROM to [facility] with </= 4/10 pain.    Baseline see flowsheet, still guarded and painful lacking abduction and ER ROM  > flexion and IR    Time 4    Period Weeks    Status On-going    Target Date 05/27/20      PT SHORT TERM GOAL #3   Title Pt will initiate AROM of shoulder, demonstrating ability to reach to shoulder height to improve ADL participation for bathing and grooming .    Baseline pt can reach to >90 flexion actively    Time 4    Period Weeks    Status Achieved    Target Date 05/27/20             PT Long Term Goals - 05/20/20 1039      PT LONG TERM GOAL #1   Title Pt will be independent with advanced HEP for maintenance.    Time 8    Period Weeks    Status New      PT LONG TERM GOAL #2   Title Pt will demonstrate AROM WFL, in order to increase independence with all ADLs (don/doff bra, fix hair), and IADLs (meal prep, moderate cleaning).    Baseline needs help with bathing, all meal prep and cleaning (household mgmt)    Time 8    Period Weeks    Status New      PT LONG TERM GOAL #3   Title Pt will demonstrate at least 4/5 L shoulder MMT without pain.    Baseline NT    Time 8    Period Weeks    Status New      PT LONG TERM GOAL #4   Title Pt will increase FOTO ability to at least 58% ability, in order to demonstrate meaningful change in perceived level of functional ability.    Baseline 36% ability    Time 8    Period Weeks    Status New      PT LONG TERM GOAL #5   Title Pt will be able to use curling iron to do her hair.    Baseline unable to use    Time 5    Period Weeks  Status New    Target Date 06/24/20                 Plan - 05/29/20 1245    Clinical Impression Statement Pt has excellent carryover of flexion ROM and progressed to 124 passively post manual therapy and pulleys. Pt is limited by her pain levels when she is performing stretches, likely self-limiting at home, but she is motivated to try and participate in therapy. Educated to continue to perform HEP at home and focus on increasing shoulder motion versus what she cannot do.    Personal  Factors and Comorbidities Age;Education;Time since onset of injury/illness/exacerbation;Fitness;Comorbidity 3+    Comorbidities Arthritis, Osteoporosis, HTN, HOH    Examination-Activity Limitations Bathing;Dressing;Reach Overhead    Examination-Participation Restrictions Cleaning;Laundry;Meal Prep;Shop;Community Activity    Stability/Clinical Decision Making Evolving/Moderate complexity    Rehab Potential Good    PT Frequency 2x / week    PT Duration 8 weeks    PT Treatment/Interventions ADLs/Self Care Home Management;Aquatic Therapy;Cryotherapy;Electrical Stimulation;Iontophoresis 4mg /ml Dexamethasone;Moist Heat;Balance training;Gait training;Therapeutic activities;Functional mobility training;Therapeutic exercise;Neuromuscular re-education;Patient/family education;Manual techniques;Passive range of motion;Dry needling;Taping;Vasopneumatic Device    PT Next Visit Plan Assess response to HEP/update PRN, P/AAROM of shoulder to tolerance, gentle strengthening as tolerated, postural endurance/strength, manual therapy to address soft tissue and joint restrictions, low load longer duration stretches    PT Home Exercise Plan [id number]: Seated Scapular Retraction    Consulted and Agree with Plan of Care Patient           Patient will benefit from skilled therapeutic intervention in order to improve the following deficits and impairments:  Decreased balance,Decreased mobility,Decreased activity tolerance,Decreased strength,Increased fascial restricitons,Impaired flexibility,Impaired UE functional use,Postural dysfunction,Pain,Improper body mechanics,Impaired tone,Impaired perceived functional ability,Decreased range of motion  Visit Diagnosis: Acute pain of left shoulder  Abnormal posture  Muscle weakness (generalized)  Difficulty in walking, not elsewhere classified     Problem List Patient Active Problem List   Diagnosis Date Noted  . CDNH (chondrodermatitis nodularis helicis), right  41/96/2229  . Impacted cerumen of left ear 03/04/2016  . Fecal incontinence 04/26/2012  . Lumbar stenosis 02/08/2011    Izell [place], PT, DPT 05/29/2020, 3:08 PM  [facility] [address] [place], [place], [address] Phone: [phone]   Fax:  [phone]  Name: Danielle Hernandez MRN: [id number] Date of Birth: January 23, 1936

## 2020-06-05 ENCOUNTER — Ambulatory Visit: Payer: Medicare Other | Admitting: Physician Assistant

## 2020-06-05 ENCOUNTER — Encounter: Payer: Self-pay | Admitting: Physician Assistant

## 2020-06-05 ENCOUNTER — Ambulatory Visit (INDEPENDENT_AMBULATORY_CARE_PROVIDER_SITE_OTHER): Payer: Medicare Other

## 2020-06-05 DIAGNOSIS — S42202D Unspecified fracture of upper end of left humerus, subsequent encounter for fracture with routine healing: Secondary | ICD-10-CM

## 2020-06-05 NOTE — Progress Notes (Signed)
Office Visit Note   Patient: Danielle Hernandez           Date of Birth: Jun 14, 1935           MRN: [id number] Visit Date: 06/05/2020              Requested by: Mayra Neer, MD [address] [place],  [place] [address] PCP: Mayra Neer, MD   Assessment & Plan: Visit Diagnoses:  1. Closed fracture of proximal end of left humerus with routine healing, unspecified fracture morphology, subsequent encounter     Plan: We will have her continue to work with physical therapy on range of motion of the shoulder.  She does not need to follow-up with [place] unless she does not gain her motion in the next 3 months.  May consider subacromial injection if she does not gain full motion or near full motion.  Questions were encouraged and answered at length.  Follow-Up Instructions: Return if symptoms worsen or fail to improve.   Orders:  Orders Placed This Encounter  Procedures  . XR Shoulder Left   No orders of the defined types were placed in this encounter.     Procedures: No procedures performed   Clinical Data: No additional findings.   Subjective: Chief Complaint  Patient presents with  . Left Shoulder - Follow-up    HPI [name] returns now 14 weeks status post proximal humerus fracture.  She states overall she is doing better.  She is going to physical therapy and feels that this is beneficial.  She also obtained a pulley system and for use at home and feels that this is helped.  She has not reached her goal of being able to curl her hair though.  She has some discomfort in the shoulder with overhead activities. Review of Systems See HPI  Objective: Vital Signs: There were no vitals taken for this visit.  Physical Exam General well-developed well-nourished female no acute distress mood affect appropriate Ortho Exam Left shoulder 5 out of 5 strength with external and internal rotation against resistance.  Empty can test is negative bilaterally.  Active forward  flexion to approximately 110 to 120 degrees.  Limited external rotation left shoulder. Specialty Comments:  No specialty comments available.  Imaging: XR Shoulder Left  Result Date: 06/05/2020 3 views left shoulder: Shoulder is well located.  No acute fractures.  Proximal humerus fractures well healed with abundant callus formation.  No bony abnormalities otherwise.    PMFS History: Patient Active Problem List   Diagnosis Date Noted  . CDNH (chondrodermatitis nodularis helicis), right 17/51/0258  . Impacted cerumen of left ear 03/04/2016  . Fecal incontinence 04/26/2012  . Lumbar stenosis 02/08/2011   Past Medical History:  Diagnosis Date  . Arthritis   . Headache(784.0)   . HTN (hypertension)   . Osteoporosis   . Raynaud disease     Family History  Problem Relation Age of Onset  . Diabetes Mother   . Arthritis Mother   . Heart disease Father 52       Heart attack, blood clot to the leg  . Stroke Father   . Clotting disorder Son     Past Surgical History:  Procedure Laterality Date  . ABDOMINAL HYSTERECTOMY    . BACK SURGERY    . TONSILLECTOMY    . under chin cyst removed    . WRIST GANGLION EXCISION     right hand   Social History   Occupational History  .  Not on file  Tobacco Use  . Smoking status: Never Smoker  . Smokeless tobacco: Never Used  Substance and Sexual Activity  . Alcohol use: No  . Drug use: No  . Sexual activity: Not on file

## 2020-06-06 ENCOUNTER — Ambulatory Visit: Payer: Medicare Other

## 2020-06-06 ENCOUNTER — Other Ambulatory Visit: Payer: Self-pay

## 2020-06-06 DIAGNOSIS — R293 Abnormal posture: Secondary | ICD-10-CM

## 2020-06-06 DIAGNOSIS — M25512 Pain in left shoulder: Secondary | ICD-10-CM | POA: Diagnosis not present

## 2020-06-06 DIAGNOSIS — R262 Difficulty in walking, not elsewhere classified: Secondary | ICD-10-CM | POA: Diagnosis not present

## 2020-06-06 DIAGNOSIS — M6281 Muscle weakness (generalized): Secondary | ICD-10-CM | POA: Diagnosis not present

## 2020-06-06 NOTE — Therapy (Signed)
[place], [place], [address] Phone: [phone]   Fax:  [phone]  Physical Therapy Treatment  Patient Details  Name: Danielle Hernandez MRN: [id number] Date of Birth: July 27, 1935 Referring Provider (PT): Erskine Emery, PA-C   Encounter Date: 06/06/2020   PT End of Session - 06/06/20 1126    Visit Number 6    Number of Visits 16    Date for PT Re-Evaluation 06/24/20    Authorization Type UHC Medicare    Authorization Time Period FOTO visit #6 and #10, [place] Modifier visit 15    Progress Note Due on Visit 10    PT Start Time 1030   pt arrived late due to falling asleep   PT Stop Time 1100    PT Time Calculation (min) 30 min    Activity Tolerance Patient tolerated treatment well;Patient limited by pain    Behavior During Therapy [facility] for tasks assessed/performed           Past Medical History:  Diagnosis Date  . Arthritis   . Headache(784.0)   . HTN (hypertension)   . Osteoporosis   . Raynaud disease     Past Surgical History:  Procedure Laterality Date  . ABDOMINAL HYSTERECTOMY    . BACK SURGERY    . TONSILLECTOMY    . under chin cyst removed    . WRIST GANGLION EXCISION     right hand    There were no vitals filed for this visit.   Subjective Assessment - 06/06/20 1036    Subjective Pt reports she saw her PA yesterday, who told her she doesn't need to come back unless her shoulder isn't getting better. "My pain is pretty good today, 5/10". "I fell asleep before I came here, I'm so sorry I'm late".    Limitations Lifting;House hold activities    Diagnostic tests XR: Significant consolidation of the proximal humerus fracture. No change in overall position alignment.  Shoulder is well  located.    Patient Stated Goals Be able to get my clothes on, get the pain to go away    Currently in Pain? Yes    Pain Score 5     Pain Onset More than a month ago                              [facility] Adult PT Treatment/Exercise - 06/06/20 0001      Self-Care   Self-Care Other Self-Care Comments    Other Self-Care Comments  see pt edu      Shoulder Exercises: Standing   Row Strengthening;Both;15 reps;Theraband    Theraband Level (Shoulder Row) Level 2 (Red)    Row Limitations hands on assist for scapular depression and retraction      Shoulder Exercises: Pulleys   Flexion 3 minutes    Flexion Limitations with manual scapular upward/downward rotation    Scaption 3 minutes    Scaption Limitations with manual scapular upward/downward rotation      Shoulder Exercises: ROM/Strengthening   UBE (Upper Arm Bike) L1 3 min fwd    Ranger Flexion x10, Scaption x 10 manual assist for scapulohumeral rhythm and decr UT compensation    Other ROM/Strengthening Exercises ladder board flexion x10      Shoulder Exercises: Isometric Strengthening   External Rotation 5X10"    External Rotation Limitations at wall c towel roll under arm      Shoulder Exercises: Stretch  External Rotation Stretch 5 reps;20 seconds   0 abd at wall c towel roll under elbow for proper positioning   Other Shoulder Stretches standing functional ER reach to head with R UE assisting 5x10"                  PT Education - 06/06/20 1118    Education Details Bring exercises next visit to condense down    Person(s) Educated Patient    Methods Explanation;Verbal cues    Comprehension Verbalized understanding;Returned demonstration;Verbal cues required            PT Short Term Goals - 05/29/20 1505      PT SHORT TERM GOAL #1   Title Pt will be I and compliant with initial HEP.    Baseline mostly compliant    Time 3    Period Weeks    Status On-going    Target Date 05/20/20      PT SHORT TERM GOAL #2   Title Pt will increase L shoulder PROM to [facility] with </= 4/10 pain.    Baseline see flowsheet, still guarded and painful lacking abduction and ER ROM > flexion  and IR    Time 4    Period Weeks    Status On-going    Target Date 05/27/20      PT SHORT TERM GOAL #3   Title Pt will initiate AROM of shoulder, demonstrating ability to reach to shoulder height to improve ADL participation for bathing and grooming .    Baseline pt can reach to >90 flexion actively    Time 4    Period Weeks    Status Achieved    Target Date 05/27/20             PT Long Term Goals - 05/20/20 1039      PT LONG TERM GOAL #1   Title Pt will be independent with advanced HEP for maintenance.    Time 8    Period Weeks    Status New      PT LONG TERM GOAL #2   Title Pt will demonstrate AROM WFL, in order to increase independence with all ADLs (don/doff bra, fix hair), and IADLs (meal prep, moderate cleaning).    Baseline needs help with bathing, all meal prep and cleaning (household mgmt)    Time 8    Period Weeks    Status New      PT LONG TERM GOAL #3   Title Pt will demonstrate at least 4/5 L shoulder MMT without pain.    Baseline NT    Time 8    Period Weeks    Status New      PT LONG TERM GOAL #4   Title Pt will increase FOTO ability to at least 58% ability, in order to demonstrate meaningful change in perceived level of functional ability.    Baseline 36% ability    Time 8    Period Weeks    Status New      PT LONG TERM GOAL #5   Title Pt will be able to use curling iron to do her hair.    Baseline unable to use    Time 5    Period Weeks    Status New    Target Date 06/24/20                 Plan - 06/06/20 1151    Clinical Impression Statement Pt arrived late after falling asleep before appointment.  She had a good follow-up visit with her PA yesterday, and was advised to only return should she not improve or get worse. Pt tolerated session well today, initiating UBE for UE AAROM but fatiguing after a few minutes. Additionally, worked with pt on UE ranger and ladder board for improved flexion and scaption. Initiated gentle periscapular  strength with rows, using red theraband, and pt performed with excellent scapular retraction and depression + manual cuing. Pt continues to be most limited into ER --> finished session with assisted ER at 0 abduction at wall and iso ER, as well as practice of functional ER behind head with R UE assisting to work toward goal of using curling iron.    Personal Factors and Comorbidities Age;Education;Time since onset of injury/illness/exacerbation;Fitness;Comorbidity 3+    Comorbidities Arthritis, Osteoporosis, HTN, HOH    Examination-Activity Limitations Bathing;Dressing;Reach Overhead    Examination-Participation Restrictions Cleaning;Laundry;Meal Prep;Shop;Community Activity    Stability/Clinical Decision Making Evolving/Moderate complexity    Rehab Potential Good    PT Frequency 2x / week    PT Duration 8 weeks    PT Treatment/Interventions ADLs/Self Care Home Management;Aquatic Therapy;Cryotherapy;Electrical Stimulation;Iontophoresis 4mg /ml Dexamethasone;Moist Heat;Balance training;Gait training;Therapeutic activities;Functional mobility training;Therapeutic exercise;Neuromuscular re-education;Patient/family education;Manual techniques;Passive range of motion;Dry needling;Taping;Vasopneumatic Device    PT Next Visit Plan Condense HEP, maybe add strength, Assess response to HEP/update PRN, P/AAROM of shoulder to tolerance, gentle strengthening as tolerated, postural endurance/strength, manual therapy to address soft tissue and joint restrictions, low load longer duration stretches    PT Home Exercise Plan [id number]: Seated Scapular Retraction    Consulted and Agree with Plan of Care Patient           Patient will benefit from skilled therapeutic intervention in order to improve the following deficits and impairments:  Decreased balance,Decreased mobility,Decreased activity tolerance,Decreased strength,Increased fascial restricitons,Impaired flexibility,Impaired UE functional use,Postural  dysfunction,Pain,Improper body mechanics,Impaired tone,Impaired perceived functional ability,Decreased range of motion  Visit Diagnosis: Acute pain of left shoulder  Abnormal posture  Muscle weakness (generalized)  Difficulty in walking, not elsewhere classified     Problem List Patient Active Problem List   Diagnosis Date Noted  . CDNH (chondrodermatitis nodularis helicis), right 24/09/7351  . Impacted cerumen of left ear 03/04/2016  . Fecal incontinence 04/26/2012  . Lumbar stenosis 02/08/2011    Izell Nunn, PT, DPT 06/06/2020, 12:08 PM  [facility] [address] [place], [place], [address] Phone: [phone]   Fax:  [phone]  Name: Danielle Hernandez MRN: [id number] Date of Birth: Nov 05, 1935

## 2020-06-10 ENCOUNTER — Ambulatory Visit: Payer: Medicare Other

## 2020-06-10 ENCOUNTER — Other Ambulatory Visit: Payer: Self-pay

## 2020-06-10 DIAGNOSIS — M6281 Muscle weakness (generalized): Secondary | ICD-10-CM | POA: Diagnosis not present

## 2020-06-10 DIAGNOSIS — M25512 Pain in left shoulder: Secondary | ICD-10-CM

## 2020-06-10 DIAGNOSIS — R262 Difficulty in walking, not elsewhere classified: Secondary | ICD-10-CM

## 2020-06-10 DIAGNOSIS — R293 Abnormal posture: Secondary | ICD-10-CM | POA: Diagnosis not present

## 2020-06-10 NOTE — Therapy (Addendum)
[place], [place], [address] Phone: [phone]   Fax:  [phone]  Physical Therapy Treatment  Patient Details  Name: Danielle Hernandez MRN: [id number] Date of Birth: [birth year] Referring Provider (PT): Erskine Emery, PA-C   Encounter Date: 06/10/2020   PT End of Session - 06/10/20 1228    Visit Number 7    Number of Visits 16    Date for PT Re-Evaluation 06/24/20    Authorization Type [employer] Medicare    Authorization Time Period FOTO visit #6 and #10, [place] Modifier visit 15    Progress Note Due on Visit 10    PT Start Time 1230    PT Stop Time 1315    PT Time Calculation (min) 45 min    Activity Tolerance Patient tolerated treatment well;Patient limited by pain    Behavior During Therapy [facility] for tasks assessed/performed           Past Medical History:  Diagnosis Date  . Arthritis   . Headache(784.0)   . HTN (hypertension)   . Osteoporosis   . Raynaud disease     Past Surgical History:  Procedure Laterality Date  . ABDOMINAL HYSTERECTOMY    . BACK SURGERY    . TONSILLECTOMY    . under chin cyst removed    . WRIST GANGLION EXCISION     right hand    There were no vitals filed for this visit.   Subjective Assessment - 06/10/20 1228    Subjective Pt reports shoulder is ok, still cannot use curling iron.    Limitations Lifting;House hold activities    Diagnostic tests XR: Significant consolidation of the proximal humerus fracture. No change in overall position alignment.  Shoulder is well  located.    Patient Stated Goals Be able to get my clothes on, get the pain to go away    Currently in Pain? Yes    Pain Score 4     Pain Location Shoulder    Pain Orientation Left    Pain Descriptors / Indicators Aching;Sore    Pain Onset More than a month ago              [facility] PT Assessment - 06/10/20 0001      PROM   Left Shoulder Flexion 112 Degrees   125 post manual   Left Shoulder ABduction  80 Degrees   90 post manual   Left Shoulder External Rotation 26 Degrees   32 post manual                        [facility] Adult PT Treatment/Exercise - 06/10/20 0001      Shoulder Exercises: Supine   Flexion AAROM;Left;5 reps      Shoulder Exercises: Seated   Retraction 10 reps      Shoulder Exercises: Pulleys   Flexion 3 minutes    Scaption 3 minutes      Shoulder Exercises: ROM/Strengthening   UBE (Upper Arm Bike) L1 3.5 min fwd      Shoulder Exercises: Stretch   External Rotation Stretch 5 reps;20 seconds   0 abd at wall c towel roll under elbow for proper positioning   Other Shoulder Stretches standing ER at wall    Other Shoulder Stretches standing 60 pec S      Manual Therapy   Manual Therapy Joint mobilization;Soft tissue mobilization;Passive ROM;Myofascial release    Manual therapy comments supine    Joint Mobilization  grade 2-3 inf/post GJH mobs    Soft tissue mobilization STM to L UT, rhomboids, mid trap, biceps    Myofascial Release MFR to L subscap with pt's AAROM L shoulder flexion    Passive ROM PROM into L shoulder flexion, small range into ER and abduction to 90                  PT Education - 06/10/20 1317    Education Details edu on performing HEP and asking daughter for help as needed. Organized HEP for optimal order.    Person(s) Educated Patient    Methods Explanation;Demonstration;Verbal cues    Comprehension Verbalized understanding;Returned demonstration;Verbal cues required;Need further instruction            PT Short Term Goals - 05/29/20 1505      PT SHORT TERM GOAL #1   Title Pt will be I and compliant with initial HEP.    Baseline mostly compliant    Time 3    Period Weeks    Status On-going    Target Date 05/20/20      PT SHORT TERM GOAL #2   Title Pt will increase L shoulder PROM to [facility] with </= 4/10 pain.    Baseline see flowsheet, still guarded and painful lacking abduction and ER ROM > flexion and IR     Time 4    Period Weeks    Status On-going    Target Date 05/27/20      PT SHORT TERM GOAL #3   Title Pt will initiate AROM of shoulder, demonstrating ability to reach to shoulder height to improve ADL participation for bathing and grooming .    Baseline pt can reach to >90 flexion actively    Time 4    Period Weeks    Status Achieved    Target Date 05/27/20             PT Long Term Goals - 05/20/20 1039      PT LONG TERM GOAL #1   Title Pt will be independent with advanced HEP for maintenance.    Time 8    Period Weeks    Status New      PT LONG TERM GOAL #2   Title Pt will demonstrate AROM WFL, in order to increase independence with all ADLs (don/doff bra, fix hair), and IADLs (meal prep, moderate cleaning).    Baseline needs help with bathing, all meal prep and cleaning (household mgmt)    Time 8    Period Weeks    Status New      PT LONG TERM GOAL #3   Title Pt will demonstrate at least 4/5 L shoulder MMT without pain.    Baseline NT    Time 8    Period Weeks    Status New      PT LONG TERM GOAL #4   Title Pt will increase FOTO ability to at least 58% ability, in order to demonstrate meaningful change in perceived level of functional ability.    Baseline 36% ability    Time 8    Period Weeks    Status New      PT LONG TERM GOAL #5   Title Pt will be able to use curling iron to do her hair.    Baseline unable to use    Time 5    Period Weeks    Status New    Target Date 06/24/20  Plan - 06/10/20 1229    Clinical Impression Statement Pt demonstrates good carryover in PROM flexion and fair carryover in abduction and ER. She continues to be most limited into abduction and especially ER. Pt has trouble remembering how to perform her exercises properly despite frequent practice, so advised pt to ask her daughter when she is home for help to interpret pictures, as needed. Pt will continue to benefit from progressions of ROM, flexibility and  strength for return to I/ADLs.    Personal Factors and Comorbidities Age;Education;Time since onset of injury/illness/exacerbation;Fitness;Comorbidity 3+    Comorbidities Arthritis, Osteoporosis, HTN, HOH    Examination-Activity Limitations Bathing;Dressing;Reach Overhead    Examination-Participation Restrictions Cleaning;Laundry;Meal Prep;Shop;Community Activity    Stability/Clinical Decision Making Evolving/Moderate complexity    Rehab Potential Good    PT Frequency 2x / week    PT Duration 8 weeks    PT Treatment/Interventions ADLs/Self Care Home Management;Aquatic Therapy;Cryotherapy;Electrical Stimulation;Iontophoresis 4mg /ml Dexamethasone;Moist Heat;Balance training;Gait training;Therapeutic activities;Functional mobility training;Therapeutic exercise;Neuromuscular re-education;Patient/family education;Manual techniques;Passive range of motion;Dry needling;Taping;Vasopneumatic Device    PT Next Visit Plan add strength to HEP?, Assess response to HEP/update PRN, P/AAROM of shoulder to tolerance, gentle strengthening as tolerated, postural endurance/strength, manual therapy to address soft tissue and joint restrictions, low load longer duration stretches    PT Home Exercise Plan [id number]: Seated Scapular Retraction    Consulted and Agree with Plan of Care Patient           Patient will benefit from skilled therapeutic intervention in order to improve the following deficits and impairments:  Decreased balance,Decreased mobility,Decreased activity tolerance,Decreased strength,Increased fascial restricitons,Impaired flexibility,Impaired UE functional use,Postural dysfunction,Pain,Improper body mechanics,Impaired tone,Impaired perceived functional ability,Decreased range of motion  Visit Diagnosis: Acute pain of left shoulder  Abnormal posture  Muscle weakness (generalized)  Difficulty in walking, not elsewhere classified     Problem List Patient Active Problem List   Diagnosis Date  Noted  . CDNH (chondrodermatitis nodularis helicis), right 56/38/7564  . Impacted cerumen of left ear 03/04/2016  . Fecal incontinence 04/26/2012  . Lumbar stenosis 02/08/2011    Izell [place], PT, DPT 06/10/2020, 2:08 PM  [facility] [address] [place], [place], [address] Phone: [phone]   Fax:  [phone]  Name: Danielle Hernandez MRN: [id number] Date of Birth: 1935-12-19

## 2020-06-17 ENCOUNTER — Ambulatory Visit: Payer: Medicare Other

## 2020-06-17 ENCOUNTER — Other Ambulatory Visit: Payer: Self-pay

## 2020-06-17 DIAGNOSIS — R262 Difficulty in walking, not elsewhere classified: Secondary | ICD-10-CM | POA: Diagnosis not present

## 2020-06-17 DIAGNOSIS — R293 Abnormal posture: Secondary | ICD-10-CM

## 2020-06-17 DIAGNOSIS — M6281 Muscle weakness (generalized): Secondary | ICD-10-CM

## 2020-06-17 DIAGNOSIS — M25512 Pain in left shoulder: Secondary | ICD-10-CM | POA: Diagnosis not present

## 2020-06-17 NOTE — Therapy (Signed)
[place], [place], [address] Phone: [phone]   Fax:  [phone]  Physical Therapy Treatment Progress Note Reporting Period 04/29/20 to 06/17/20  See note below for Objective Data and Assessment of Progress/Goals.       Patient Details  Name: Danielle Hernandez MRN: [id number] Date of Birth: 04-07-1935 Referring Provider (PT): Erskine Emery, PA-C   Encounter Date: 06/17/2020   PT End of Session - 06/17/20 1322    Visit Number 8    Number of Visits 16    Date for PT Re-Evaluation 08/12/20    Authorization Type [employer] Medicare    Authorization Time Period FOTO visit #6 and #10, [place] Modifier visit 15    Progress Note Due on Visit 18   completed on visit 8   PT Start Time 1316    PT Stop Time 1400    PT Time Calculation (min) 44 min    Activity Tolerance Patient tolerated treatment well;Patient limited by pain    Behavior During Therapy [facility] for tasks assessed/performed           Past Medical History:  Diagnosis Date  . Arthritis   . Headache(784.0)   . HTN (hypertension)   . Osteoporosis   . Raynaud disease     Past Surgical History:  Procedure Laterality Date  . ABDOMINAL HYSTERECTOMY    . BACK SURGERY    . TONSILLECTOMY    . under chin cyst removed    . WRIST GANGLION EXCISION     right hand    There were no vitals filed for this visit.   Subjective Assessment - 06/17/20 1320    Subjective "I think my shoulder is starting to feel better, but it's really sorre. My hand hurts too, and I think I may have fallen on it".    Limitations Lifting;House hold activities    Diagnostic tests XR: Significant consolidation of the proximal humerus fracture. No change in overall position alignment.  Shoulder is well  located.    Patient Stated Goals Be able to get my clothes on, get the pain to go away    Pain Score 7     Pain Location Shoulder    Pain Orientation Left    Pain Descriptors / Indicators  Aching;Sore    Pain Type Acute pain    Pain Onset More than a month ago              [facility] PT Assessment - 06/17/20 0001      Observation/Other Assessments   Focus on Therapeutic Outcomes (FOTO)  59% ability      ROM / Strength   AROM / PROM / Strength Strength      AROM   Overall AROM Comments pain    Left Shoulder Flexion 110 Degrees    Left Shoulder ABduction 95 Degrees   slightly into sagittal plane   Left Shoulder Internal Rotation --   functional reach behind back to L1   Left Shoulder External Rotation --   functional reach behind head T1     PROM   Overall PROM Comments pain and guarding    Left Shoulder Flexion 117 Degrees    Left Shoulder ABduction 80 Degrees    Left Shoulder Internal Rotation 80 Degrees    Left Shoulder External Rotation 30 Degrees      Strength   Strength Assessment Site Shoulder    Right/Left Shoulder Right;Left    Right Shoulder Flexion 4+/5  Right Shoulder ABduction 4+/5    Right Shoulder Internal Rotation 4/5    Right Shoulder External Rotation 4-/5    Left Shoulder Flexion 4/5    Left Shoulder Extension 4/5    Left Shoulder Internal Rotation 4-/5    Left Shoulder External Rotation 3+/5                         [facility] Adult PT Treatment/Exercise - 06/17/20 0001      Shoulder Exercises: Pulleys   Flexion 3 minutes    Scaption 3 minutes      Shoulder Exercises: ROM/Strengthening   UBE (Upper Arm Bike) L1 4 min fwd      Shoulder Exercises: Stretch   External Rotation Stretch 5 reps;20 seconds   0 abd at wall c towel roll under elbow for proper positioning     Manual Therapy   Manual Therapy Joint mobilization;Soft tissue mobilization;Passive ROM;Myofascial release    Manual therapy comments supine    Joint Mobilization grade 2-3 inf/post GJH mobs    Soft tissue mobilization STM to L UT, rhomboids, mid trap, biceps, deltoid    Passive ROM PROM into L shoulder flexion, small range into ER and abduction to 90                   PT Education - 06/17/20 1620    Education Details edu to continue to focus on ER stretching, reviewed form    Person(s) Educated Patient    Methods Explanation;Demonstration;Tactile cues;Verbal cues    Comprehension Verbalized understanding;Returned demonstration;Verbal cues required;Tactile cues required            PT Short Term Goals - 06/17/20 1625      PT SHORT TERM GOAL #1   Title Pt will be I and compliant with initial HEP.    Baseline compliant, still has questions about proper form    Time 3    Period Weeks    Status Partially Met    Target Date 05/20/20      PT SHORT TERM GOAL #2   Title Pt will increase L shoulder PROM to [facility] with </= 4/10 pain.    Baseline see flowsheet, still guarded and painful lacking abduction and ER ROM > flexion and IR    Time 4    Period Weeks    Status On-going    Target Date 05/27/20      PT SHORT TERM GOAL #3   Title Pt will initiate AROM of shoulder, demonstrating ability to reach to shoulder height to improve ADL participation for bathing and grooming .    Baseline pt can reach to >90 flexion actively    Time 4    Period Weeks    Status Achieved    Target Date 05/27/20             PT Long Term Goals - 06/17/20 1627      PT LONG TERM GOAL #1   Title Pt will be independent with advanced HEP for maintenance.    Time 8    Period Weeks    Status On-going      PT LONG TERM GOAL #2   Title Pt will demonstrate AROM WFL, in order to increase independence with all ADLs (don/doff bra, fix hair), and IADLs (meal prep, moderate cleaning).    Baseline needs help with hair, all meal prep and cleaning (household mgmt)    Time 8    Period Weeks  Status On-going      PT LONG TERM GOAL #3   Title Pt will demonstrate at least 4/5 L shoulder MMT without pain.    Baseline see flowsheet    Time 8    Period Weeks    Status On-going      PT LONG TERM GOAL #4   Title Pt will increase FOTO ability to at least 58%  ability, in order to demonstrate meaningful change in perceived level of functional ability.    Baseline 36% ability at initial evaluation 04/29/20, 59% ability 06/17/20    Time 8    Period Weeks    Status Achieved      PT LONG TERM GOAL #5   Title Pt will be able to use curling iron to do her hair.    Baseline unable to use    Time 5    Period Weeks    Status On-going                 Plan - 06/17/20 1323    Clinical Impression Statement Pt presents with gradual progress toward goals with slowly increasing ROM and strength. She continues to be limited by stiffness, guarding, and pain affecting ability to relax during manual therapy. She has difficulty remembering form of stretches and exercises in HEP that has limited adding to HEP and has been advised to ask daughter for help to monitor form if she is unsure. She tolerated today's treatment well with no adverse reactions, increasing time on UBE demonstrating improved endurance of L shoulder/UE. She will continue to benefit from skilled physical therapy to restore ROM and strength, in order to take care of herself and perform I/ADLs.    Personal Factors and Comorbidities Age;Education;Time since onset of injury/illness/exacerbation;Fitness;Comorbidity 3+    Comorbidities Arthritis, Osteoporosis, HTN, HOH    Examination-Activity Limitations Bathing;Dressing;Reach Overhead    Examination-Participation Restrictions Cleaning;Laundry;Meal Prep;Shop;Community Activity    Stability/Clinical Decision Making Evolving/Moderate complexity    Rehab Potential Good    PT Frequency 2x / week    PT Duration 8 weeks    PT Treatment/Interventions ADLs/Self Care Home Management;Aquatic Therapy;Cryotherapy;Electrical Stimulation;Iontophoresis 50m/ml Dexamethasone;Moist Heat;Balance training;Gait training;Therapeutic activities;Functional mobility training;Therapeutic exercise;Neuromuscular re-education;Patient/family education;Manual techniques;Passive  range of motion;Dry needling;Taping;Vasopneumatic Device    PT Next Visit Plan add strength to HEP?, Assess response to HEP/update PRN, P/AAROM of shoulder to tolerance, gentle strengthening as tolerated, postural endurance/strength, manual therapy to address soft tissue and joint restrictions, low load longer duration stretches    PT Home Exercise Plan [id number]: Seated Scapular Retraction    Consulted and Agree with Plan of Care Patient           Patient will benefit from skilled therapeutic intervention in order to improve the following deficits and impairments:  Decreased balance,Decreased mobility,Decreased activity tolerance,Decreased strength,Increased fascial restricitons,Impaired flexibility,Impaired UE functional use,Postural dysfunction,Pain,Improper body mechanics,Impaired tone,Impaired perceived functional ability,Decreased range of motion  Visit Diagnosis: Acute pain of left shoulder  Abnormal posture  Muscle weakness (generalized)  Difficulty in walking, not elsewhere classified     Problem List Patient Active Problem List   Diagnosis Date Noted  . CDNH (chondrodermatitis nodularis helicis), right 050/03/7046 . Impacted cerumen of left ear 03/04/2016  . Fecal incontinence 04/26/2012  . Lumbar stenosis 02/08/2011    KIzell Peachland PT, DPT 06/17/2020, 4:38 PM  [facility] [address]Phone: [phone]  Fax:  [phone] Name: Danielle VEILLONMRN: [id number] of  Birth: 09-22-1935

## 2020-06-19 ENCOUNTER — Other Ambulatory Visit: Payer: Self-pay

## 2020-06-19 ENCOUNTER — Ambulatory Visit: Payer: Medicare Other

## 2020-06-19 DIAGNOSIS — M25512 Pain in left shoulder: Secondary | ICD-10-CM | POA: Diagnosis not present

## 2020-06-19 DIAGNOSIS — R293 Abnormal posture: Secondary | ICD-10-CM

## 2020-06-19 DIAGNOSIS — M6281 Muscle weakness (generalized): Secondary | ICD-10-CM | POA: Diagnosis not present

## 2020-06-19 DIAGNOSIS — R262 Difficulty in walking, not elsewhere classified: Secondary | ICD-10-CM | POA: Diagnosis not present

## 2020-06-19 NOTE — Therapy (Signed)
[place], [place], [address] Phone: [phone]   Fax:  [phone]  Physical Therapy Treatment  Patient Details  Name: Danielle Hernandez MRN: [id number] Date of Birth: 1935-01-26 Referring Provider (PT): Erskine Emery, PA-C   Encounter Date: 06/19/2020   PT End of Session - 06/19/20 1239    Visit Number 9    Number of Visits 16    Date for PT Re-Evaluation 08/12/20    Authorization Type [employer] Medicare    Authorization Time Period FOTO visit #6 and #10, [place] Modifier visit 15    Progress Note Due on Visit 18   completed on visit 8   PT Start Time 1232    PT Stop Time 1315    PT Time Calculation (min) 43 min    Activity Tolerance Patient tolerated treatment well    Behavior During Therapy [facility] for tasks assessed/performed           Past Medical History:  Diagnosis Date  . Arthritis   . Headache(784.0)   . HTN (hypertension)   . Osteoporosis   . Raynaud disease     Past Surgical History:  Procedure Laterality Date  . ABDOMINAL HYSTERECTOMY    . BACK SURGERY    . TONSILLECTOMY    . under chin cyst removed    . WRIST GANGLION EXCISION     right hand    There were no vitals filed for this visit.   Subjective Assessment - 06/19/20 1234    Subjective Pt reports she wore her jean jacket to get some help with taking it off the right way. Her arm is sore just below the shoulder. "I've gotten to where I can sweep my kitchen now. Of course, I use my R hand more."    Limitations Lifting;House hold activities    Diagnostic tests XR: Significant consolidation of the proximal humerus fracture. No change in overall position alignment.  Shoulder is well  located.    Patient Stated Goals Be able to get my clothes on, get the pain to go away    Currently in Pain? Yes    Pain Score 6     Pain Location Shoulder    Pain Orientation Left    Pain Descriptors / Indicators Aching;Sore    Pain Type Acute pain    Pain  Radiating Towards distal to shower    Pain Onset More than a month ago                             [facility] Adult PT Treatment/Exercise - 06/19/20 0001      Shoulder Exercises: Supine   External Rotation AAROM;Left;20 reps    External Rotation Limitations with dowel into ER at 0, 40, and 60    Flexion AAROM;Both;12 reps      Shoulder Exercises: ROM/Strengthening   UBE (Upper Arm Bike) L1.2 5 min (2x30" bkwd, 4' fwd)      Shoulder Exercises: Stretch   Other Shoulder Stretches standing 60 pec S      Manual Therapy   Manual Therapy Joint mobilization;Soft tissue mobilization;Passive ROM;Myofascial release    Manual therapy comments supine    Joint Mobilization grade 2-3 inf/post GJH mobs    Soft tissue mobilization STM to L UT, posterior cuff, biceps, deltoid    Myofascial Release MRF to L subscap into PROM L shoulder flexion    Passive ROM all planes, focused on ER  PT Short Term Goals - 06/17/20 1625      PT SHORT TERM GOAL #1   Title Pt will be I and compliant with initial HEP.    Baseline compliant, still has questions about proper form    Time 3    Period Weeks    Status Partially Met    Target Date 05/20/20      PT SHORT TERM GOAL #2   Title Pt will increase L shoulder PROM to [facility] with </= 4/10 pain.    Baseline see flowsheet, still guarded and painful lacking abduction and ER ROM > flexion and IR    Time 4    Period Weeks    Status On-going    Target Date 05/27/20      PT SHORT TERM GOAL #3   Title Pt will initiate AROM of shoulder, demonstrating ability to reach to shoulder height to improve ADL participation for bathing and grooming .    Baseline pt can reach to >90 flexion actively    Time 4    Period Weeks    Status Achieved    Target Date 05/27/20             PT Long Term Goals - 06/17/20 1627      PT LONG TERM GOAL #1   Title Pt will be independent with advanced HEP for maintenance.    Time 8     Period Weeks    Status On-going      PT LONG TERM GOAL #2   Title Pt will demonstrate AROM WFL, in order to increase independence with all ADLs (don/doff bra, fix hair), and IADLs (meal prep, moderate cleaning).    Baseline needs help with hair, all meal prep and cleaning (household mgmt)    Time 8    Period Weeks    Status On-going      PT LONG TERM GOAL #3   Title Pt will demonstrate at least 4/5 L shoulder MMT without pain.    Baseline see flowsheet    Time 8    Period Weeks    Status On-going      PT LONG TERM GOAL #4   Title Pt will increase FOTO ability to at least 58% ability, in order to demonstrate meaningful change in perceived level of functional ability.    Baseline 36% ability at initial evaluation 04/29/20, 59% ability 06/17/20    Time 8    Period Weeks    Status Achieved      PT LONG TERM GOAL #5   Title Pt will be able to use curling iron to do her hair.    Baseline unable to use    Time 5    Period Weeks    Status On-going                 Plan - 06/19/20 1239    Clinical Impression Statement Pt presents with continued pain, but improving function in her L UE with FOTO improving to 59% last session (achieving LTG for FOTO) and reporting return to IADLs at home like sweeping. Focused manual therapy on increasing external rotations in varying degrees of abduction below 90 with gradual improvement to >30. Initiated dowel AAROM in supine into flexion and ER at 0, 40, and 60 with PT stabilizing humerus and guiding foearm to optimize stretch. Pt educated to continue to perform HEP everyday, per report of not doing so.    Personal Factors and Comorbidities Age;Education;Time since onset of injury/illness/exacerbation;Fitness;Comorbidity 3+  Comorbidities Arthritis, Osteoporosis, HTN, HOH    Examination-Activity Limitations Bathing;Dressing;Reach Overhead    Examination-Participation Restrictions Cleaning;Laundry;Meal Prep;Shop;Community Activity     Stability/Clinical Decision Making Evolving/Moderate complexity    Rehab Potential Good    PT Frequency 2x / week    PT Duration 8 weeks    PT Treatment/Interventions ADLs/Self Care Home Management;Aquatic Therapy;Cryotherapy;Electrical Stimulation;Iontophoresis 69m/ml Dexamethasone;Moist Heat;Balance training;Gait training;Therapeutic activities;Functional mobility training;Therapeutic exercise;Neuromuscular re-education;Patient/family education;Manual techniques;Passive range of motion;Dry needling;Taping;Vasopneumatic Device    PT Next Visit Plan add strength to HEP?, add dowel AAROM to HEP, Assess response to HEP/update PRN, P/AAROM of shoulder to tolerance, gentle strengthening as tolerated, postural endurance/strength, manual therapy to address soft tissue and joint restrictions, low load longer duration stretches, focus on ER    PT Home Exercise Plan [id number]: Seated Scapular Retraction    Consulted and Agree with Plan of Care Patient           Patient will benefit from skilled therapeutic intervention in order to improve the following deficits and impairments:  Decreased balance,Decreased mobility,Decreased activity tolerance,Decreased strength,Increased fascial restricitons,Impaired flexibility,Impaired UE functional use,Postural dysfunction,Pain,Improper body mechanics,Impaired tone,Impaired perceived functional ability,Decreased range of motion  Visit Diagnosis: Acute pain of left shoulder  Abnormal posture  Muscle weakness (generalized)  Difficulty in walking, not elsewhere classified     Problem List Patient Active Problem List   Diagnosis Date Noted  . CDNH (chondrodermatitis nodularis helicis), right 036/43/8377 . Impacted cerumen of left ear 03/04/2016  . Fecal incontinence 04/26/2012  . Lumbar stenosis 02/08/2011    KIzell Fortuna PT, DPT 06/19/2020, 4:30 PM  [facility]  [address]Phone: [phone]  Fax:  [phone] Name: FHIRA TRENTMRN: [id number] of Birth: [birth year]

## 2020-06-24 ENCOUNTER — Other Ambulatory Visit: Payer: Self-pay

## 2020-06-24 ENCOUNTER — Ambulatory Visit: Payer: Medicare Other

## 2020-06-24 DIAGNOSIS — M6281 Muscle weakness (generalized): Secondary | ICD-10-CM | POA: Diagnosis not present

## 2020-06-24 DIAGNOSIS — M25512 Pain in left shoulder: Secondary | ICD-10-CM | POA: Diagnosis not present

## 2020-06-24 DIAGNOSIS — R293 Abnormal posture: Secondary | ICD-10-CM

## 2020-06-24 DIAGNOSIS — R262 Difficulty in walking, not elsewhere classified: Secondary | ICD-10-CM

## 2020-06-24 NOTE — Therapy (Signed)
[place], [place], [address] Phone: [phone]   Fax:  [phone]  Physical Therapy Treatment  Patient Details  Name: Danielle Hernandez MRN: [id number] Date of Birth: 15-Feb-1935 Referring Provider (PT): Erskine Emery, PA-C   Encounter Date: 06/24/2020   PT End of Session - 06/24/20 1325    Visit Number 10    Number of Visits 16    Date for PT Re-Evaluation 08/12/20    Authorization Type [employer] Medicare    Authorization Time Period FOTO visit #6 and #10, [place] Modifier visit 15    Progress Note Due on Visit 18   completed on visit 8   PT Start Time 1320    PT Stop Time 1400    PT Time Calculation (min) 40 min    Activity Tolerance Patient tolerated treatment well    Behavior During Therapy [facility] for tasks assessed/performed           Past Medical History:  Diagnosis Date  . Arthritis   . Headache(784.0)   . HTN (hypertension)   . Osteoporosis   . Raynaud disease     Past Surgical History:  Procedure Laterality Date  . ABDOMINAL HYSTERECTOMY    . BACK SURGERY    . TONSILLECTOMY    . under chin cyst removed    . WRIST GANGLION EXCISION     right hand    There were no vitals filed for this visit.   Subjective Assessment - 06/24/20 1324    Subjective Pt reports she has been "sitting on the couch and sleeping a lot. I shouldn't be doing that. I want to get to the yard, but didn't know if I could do that yet". Her shoulder "ain't well", she thinks from being on the couch too much. Still unable to pull her seatbelt across with her L UE, but it's able to help her more to complete seatbelt placement and buckling.    Limitations Lifting;House hold activities    Diagnostic tests XR: Significant consolidation of the proximal humerus fracture. No change in overall position alignment.  Shoulder is well  located.    Patient Stated Goals Be able to get my clothes on, get the pain to go away    Currently in Pain?  Yes    Pain Score 5     Pain Location Shoulder    Pain Orientation Left    Pain Descriptors / Indicators Aching;Sore    Pain Type Acute pain    Pain Onset More than a month ago              [facility] PT Assessment - 06/24/20 0001      PROM   Left Shoulder Flexion 117 Degrees    Left Shoulder ABduction 85 Degrees    Left Shoulder External Rotation 40 Degrees                         [facility] Adult PT Treatment/Exercise - 06/24/20 0001      Self-Care   Self-Care Other Self-Care Comments    Other Self-Care Comments  see pt edu      Shoulder Exercises: Supine   External Rotation AAROM;Left;20 reps    External Rotation Limitations with dowel into ER at 0, 45, and 90    Flexion AAROM;Both;12 reps   some shakiness in L shoulder at end ROM     Shoulder Exercises: Seated   External Rotation Limitations functional reaching behind with  opp UE to assist at elbow      Shoulder Exercises: ROM/Strengthening   UBE (Upper Arm Bike) L1.5 3' fwd, 2' bkwd      Manual Therapy   Manual Therapy Joint mobilization;Soft tissue mobilization;Passive ROM;Myofascial release    Manual therapy comments supine    Joint Mobilization grade 2-3 inf/post GJH mobs    Soft tissue mobilization STM to L UT, posterior cuff, biceps, deltoid    Myofascial Release MRF to L subscap into PROM L shoulder flexion    Passive ROM all planes, focused on ER                  PT Education - 06/24/20 1432    Education Details work on putting hand behind head and using R hand to help elevate L UE and stretch further into functional ER    Person(s) Educated Patient    Methods Explanation;Demonstration;Tactile cues;Verbal cues    Comprehension Verbalized understanding;Returned demonstration;Verbal cues required;Tactile cues required            PT Short Term Goals - 06/17/20 1625      PT SHORT TERM GOAL #1   Title Pt will be I and compliant with initial HEP.    Baseline compliant, still has  questions about proper form    Time 3    Period Weeks    Status Partially Met    Target Date 05/20/20      PT SHORT TERM GOAL #2   Title Pt will increase L shoulder PROM to [facility] with </= 4/10 pain.    Baseline see flowsheet, still guarded and painful lacking abduction and ER ROM > flexion and IR    Time 4    Period Weeks    Status On-going    Target Date 05/27/20      PT SHORT TERM GOAL #3   Title Pt will initiate AROM of shoulder, demonstrating ability to reach to shoulder height to improve ADL participation for bathing and grooming .    Baseline pt can reach to >90 flexion actively    Time 4    Period Weeks    Status Achieved    Target Date 05/27/20             PT Long Term Goals - 06/17/20 1627      PT LONG TERM GOAL #1   Title Pt will be independent with advanced HEP for maintenance.    Time 8    Period Weeks    Status On-going      PT LONG TERM GOAL #2   Title Pt will demonstrate AROM WFL, in order to increase independence with all ADLs (don/doff bra, fix hair), and IADLs (meal prep, moderate cleaning).    Baseline needs help with hair, all meal prep and cleaning (household mgmt)    Time 8    Period Weeks    Status On-going      PT LONG TERM GOAL #3   Title Pt will demonstrate at least 4/5 L shoulder MMT without pain.    Baseline see flowsheet    Time 8    Period Weeks    Status On-going      PT LONG TERM GOAL #4   Title Pt will increase FOTO ability to at least 58% ability, in order to demonstrate meaningful change in perceived level of functional ability.    Baseline 36% ability at initial evaluation 04/29/20, 59% ability 06/17/20    Time 8    Period Weeks  Status Achieved      PT LONG TERM GOAL #5   Title Pt will be able to use curling iron to do her hair.    Baseline unable to use    Time 5    Period Weeks    Status On-going                 Plan - 06/24/20 1325    Clinical Impression Statement Pt demonstrates improvement in L shoulder  mobility and ROM, carrying over 40 of L shoulder ER and 85 of abduction. She continues to have pain, but was not as guarded today and had less joint stiffness with manual therapy. She has been more diligent with HEP, which was evident in ROM upon return today.    Personal Factors and Comorbidities Age;Education;Time since onset of injury/illness/exacerbation;Fitness;Comorbidity 3+    Comorbidities Arthritis, Osteoporosis, HTN, HOH    Examination-Activity Limitations Bathing;Dressing;Reach Overhead    Examination-Participation Restrictions Cleaning;Laundry;Meal Prep;Shop;Community Activity    Stability/Clinical Decision Making Evolving/Moderate complexity    Rehab Potential Good    PT Frequency 2x / week    PT Duration 8 weeks    PT Treatment/Interventions ADLs/Self Care Home Management;Aquatic Therapy;Cryotherapy;Electrical Stimulation;Iontophoresis 14m/ml Dexamethasone;Moist Heat;Balance training;Gait training;Therapeutic activities;Functional mobility training;Therapeutic exercise;Neuromuscular re-education;Patient/family education;Manual techniques;Passive range of motion;Dry needling;Taping;Vasopneumatic Device    PT Next Visit Plan add strength to HEP?, add dowel AAROM pics to HEP, Assess response to HEP/update PRN, P/AAROM of shoulder to tolerance, gentle strengthening as tolerated, postural endurance/strength, manual therapy to address soft tissue and joint restrictions, low load longer duration stretches, focus on ER    PT Home Exercise Plan [id number]: Seated Scapular Retraction    Consulted and Agree with Plan of Care Patient           Patient will benefit from skilled therapeutic intervention in order to improve the following deficits and impairments:  Decreased balance,Decreased mobility,Decreased activity tolerance,Decreased strength,Increased fascial restricitons,Impaired flexibility,Impaired UE functional use,Postural dysfunction,Pain,Improper body mechanics,Impaired tone,Impaired  perceived functional ability,Decreased range of motion  Visit Diagnosis: Acute pain of left shoulder  Abnormal posture  Muscle weakness (generalized)  Difficulty in walking, not elsewhere classified     Problem List Patient Active Problem List   Diagnosis Date Noted  . CDNH (chondrodermatitis nodularis helicis), right 028/41/3244 . Impacted cerumen of left ear 03/04/2016  . Fecal incontinence 04/26/2012  . Lumbar stenosis 02/08/2011    KIzell [place] PT, DPT 06/24/2020, 2:39 PM  [facility] [address]Phone: [phone]  Fax:  [phone] Name: Danielle CARRERASMRN: [id number] of Birth: [birth year]

## 2020-06-26 ENCOUNTER — Ambulatory Visit: Payer: Medicare Other | Attending: Physician Assistant

## 2020-06-26 ENCOUNTER — Other Ambulatory Visit: Payer: Self-pay

## 2020-06-26 DIAGNOSIS — R262 Difficulty in walking, not elsewhere classified: Secondary | ICD-10-CM | POA: Diagnosis not present

## 2020-06-26 DIAGNOSIS — M25512 Pain in left shoulder: Secondary | ICD-10-CM | POA: Insufficient documentation

## 2020-06-26 DIAGNOSIS — R293 Abnormal posture: Secondary | ICD-10-CM | POA: Diagnosis not present

## 2020-06-26 DIAGNOSIS — M6281 Muscle weakness (generalized): Secondary | ICD-10-CM | POA: Insufficient documentation

## 2020-06-26 NOTE — Therapy (Signed)
[place], [place], [address] Phone: [phone]   Fax:  [phone]  Physical Therapy Treatment  Patient Details  Name: Danielle Hernandez MRN: [id number] Date of Birth: 11/04/1935 Referring Provider (PT): Erskine Emery, PA-C   Encounter Date: 06/26/2020   PT End of Session - 06/26/20 1318    Visit Number 11    Number of Visits 16    Date for PT Re-Evaluation 08/12/20    Authorization Type [employer] Medicare    Authorization Time Period FOTO visit #6 and #10, Arial Modifier visit 15    Progress Note Due on Visit 18   completed on visit 8   PT Start Time 1316    PT Stop Time 1358    PT Time Calculation (min) 42 min    Activity Tolerance Patient tolerated treatment well    Behavior During Therapy [facility] for tasks assessed/performed           Past Medical History:  Diagnosis Date  . Arthritis   . Headache(784.0)   . HTN (hypertension)   . Osteoporosis   . Raynaud disease     Past Surgical History:  Procedure Laterality Date  . ABDOMINAL HYSTERECTOMY    . BACK SURGERY    . TONSILLECTOMY    . under chin cyst removed    . WRIST GANGLION EXCISION     right hand    There were no vitals filed for this visit.   Subjective Assessment - 06/26/20 1318    Subjective Pt reports she "slept in her bed the last 2 nights and hasn't had to get on the couch".    Limitations Lifting;House hold activities    Diagnostic tests XR: Significant consolidation of the proximal humerus fracture. No change in overall position alignment.  Shoulder is well  located.    Patient Stated Goals Be able to get my clothes on, get the pain to go away    Currently in Pain? Yes    Pain Score 4     Pain Location Shoulder    Pain Orientation Left    Pain Descriptors / Indicators Aching;Sore    Pain Type Acute pain    Pain Onset More than a month ago              [facility] PT Assessment - 06/26/20 0001      PROM   Left Shoulder External  Rotation 43 Degrees                         [facility] Adult PT Treatment/Exercise - 06/26/20 0001      Self-Care   Self-Care Other Self-Care Comments    Other Self-Care Comments  see pt edu      Shoulder Exercises: Supine   Horizontal ABduction Both;12 reps;Theraband    Theraband Level (Shoulder Horizontal ABduction) Level 1 (Yellow)    External Rotation AAROM;Left;20 reps    External Rotation Limitations with dowel into ER at 0 and 45   5" holds   Flexion AAROM;Both;20 reps   some shakiness in L shoulder at end ROM   Other Supine Exercises supine ER with yellow theraband 12x5"      Shoulder Exercises: Seated   External Rotation 10 reps   10"   External Rotation Limitations functional reaching behind with opp UE to assist at elbow      Shoulder Exercises: Standing   External Rotation Left;10 reps    External Rotation Limitations functional  reach behind head, sliding elbow up/down wall    Flexion Left;10 reps;AAROM    Flexion Limitations guiding scapular upward/downward rotation    Retraction 10 reps;Both;Strengthening   verbal and tactile cues to relax L UT   Retraction Limitations at corner of doorway      Shoulder Exercises: ROM/Strengthening   UBE (Upper Arm Bike) L1.5 3' fwd, 2' bkwd      Shoulder Exercises: Stretch   Other Shoulder Stretches standing 60 pec S                  PT Education - 06/26/20 1405    Education Details added to HEP, subtracted a few old exercises to condense program    Person(s) Educated Patient    Methods Explanation;Demonstration;Tactile cues;Verbal cues;Handout    Comprehension Verbalized understanding;Returned demonstration;Verbal cues required;Tactile cues required            PT Short Term Goals - 06/17/20 1625      PT SHORT TERM GOAL #1   Title Pt will be I and compliant with initial HEP.    Baseline compliant, still has questions about proper form    Time 3    Period Weeks    Status Partially Met    Target  Date 05/20/20      PT SHORT TERM GOAL #2   Title Pt will increase L shoulder PROM to [facility] with </= 4/10 pain.    Baseline see flowsheet, still guarded and painful lacking abduction and ER ROM > flexion and IR    Time 4    Period Weeks    Status On-going    Target Date 05/27/20      PT SHORT TERM GOAL #3   Title Pt will initiate AROM of shoulder, demonstrating ability to reach to shoulder height to improve ADL participation for bathing and grooming .    Baseline pt can reach to >90 flexion actively    Time 4    Period Weeks    Status Achieved    Target Date 05/27/20             PT Long Term Goals - 06/17/20 1627      PT LONG TERM GOAL #1   Title Pt will be independent with advanced HEP for maintenance.    Time 8    Period Weeks    Status On-going      PT LONG TERM GOAL #2   Title Pt will demonstrate AROM WFL, in order to increase independence with all ADLs (don/doff bra, fix hair), and IADLs (meal prep, moderate cleaning).    Baseline needs help with hair, all meal prep and cleaning (household mgmt)    Time 8    Period Weeks    Status On-going      PT LONG TERM GOAL #3   Title Pt will demonstrate at least 4/5 L shoulder MMT without pain.    Baseline see flowsheet    Time 8    Period Weeks    Status On-going      PT LONG TERM GOAL #4   Title Pt will increase FOTO ability to at least 58% ability, in order to demonstrate meaningful change in perceived level of functional ability.    Baseline 36% ability at initial evaluation 04/29/20, 59% ability 06/17/20    Time 8    Period Weeks    Status Achieved      PT LONG TERM GOAL #5   Title Pt will be able to use curling  iron to do her hair.    Baseline unable to use    Time 5    Period Weeks    Status On-going                 Plan - 06/26/20 1318    Clinical Impression Statement Pt is making gradual progress toward goals and into greater ROM, demonstrating 43 of ER today. Condensed HEP, eliminating a few old  stretches and adding dowel ER in supine, as well as supine ER and horiz abd with yellow band for strengthening, providing pt with handout and band. Pt's pain level has steadily declined recently, and she reportedly slept in her bed versus the couch the last 2 nights.    Personal Factors and Comorbidities Age;Education;Time since onset of injury/illness/exacerbation;Fitness;Comorbidity 3+    Comorbidities Arthritis, Osteoporosis, HTN, HOH    Examination-Activity Limitations Bathing;Dressing;Reach Overhead    Examination-Participation Restrictions Cleaning;Laundry;Meal Prep;Shop;Community Activity    Stability/Clinical Decision Making Evolving/Moderate complexity    Rehab Potential Good    PT Frequency 2x / week    PT Duration 8 weeks    PT Treatment/Interventions ADLs/Self Care Home Management;Aquatic Therapy;Cryotherapy;Electrical Stimulation;Iontophoresis 29m/ml Dexamethasone;Moist Heat;Balance training;Gait training;Therapeutic activities;Functional mobility training;Therapeutic exercise;Neuromuscular re-education;Patient/family education;Manual techniques;Passive range of motion;Dry needling;Taping;Vasopneumatic Device    PT Next Visit Plan Assess response to HEP/update PRN, ROM of shoulder to tolerance, shoulder and periscapular strengthening as tolerated, postural endurance/strength, manual therapy to address soft tissue and joint restrictions PRN, focus on ER    PT Home Exercise Plan [email or web address]    Consulted and Agree with Plan of Care Patient           Patient will benefit from skilled therapeutic intervention in order to improve the following deficits and impairments:  Decreased balance,Decreased mobility,Decreased activity tolerance,Decreased strength,Increased fascial restricitons,Impaired flexibility,Impaired UE functional use,Postural dysfunction,Pain,Improper body mechanics,Impaired tone,Impaired perceived functional ability,Decreased range of motion  Visit Diagnosis: Acute pain of  left shoulder  Abnormal posture  Muscle weakness (generalized)  Difficulty in walking, not elsewhere classified     Problem List Patient Active Problem List   Diagnosis Date Noted  . CDNH (chondrodermatitis nodularis helicis), right 077/93/9688 . Impacted cerumen of left ear 03/04/2016  . Fecal incontinence 04/26/2012  . Lumbar stenosis 02/08/2011    KIzell Harlan PT, DPT 06/26/2020, 2:10 PM  [facility] [address]Phone: [phone]  Fax:  [phone] Name: Danielle SHAMPINEMRN: [id number] of Birth: 211-Dec-1937

## 2020-06-26 NOTE — Patient Instructions (Addendum)
Access Code: [email or web address] URL: [email or web address] Date: 06/26/2020 Prepared by: Kathreen Cornfield  Exercises Seated Scapular Retraction - 2 x daily - 7 x weekly - 1-2 sets - 10 reps Supine Shoulder Flexion AAROM - 2 x daily - 7 x weekly - 1-2 sets - 10 reps - 3-5 seconds hold Doorway Pec Stretch at 60 Degrees Abduction with Arm Straight - 2 x daily - 7 x weekly - 3 sets - 30 seconds hold Supine Shoulder External Rotation in 45 Degrees Abduction AAROM with Dowel - 1 x daily - 7 x weekly - 2 sets - 10 reps - 5 seconds hold Supine Bilateral Shoulder External Rotation with Resistance - 1 x daily - 7 x weekly - 2 sets - 10 reps Supine Shoulder Horizontal Abduction with Resistance - 1 x daily - 7 x weekly - 2 sets - 10 reps

## 2020-06-30 DIAGNOSIS — M5416 Radiculopathy, lumbar region: Secondary | ICD-10-CM | POA: Diagnosis not present

## 2020-07-01 ENCOUNTER — Other Ambulatory Visit: Payer: Self-pay

## 2020-07-01 ENCOUNTER — Ambulatory Visit: Payer: Medicare Other

## 2020-07-01 DIAGNOSIS — R262 Difficulty in walking, not elsewhere classified: Secondary | ICD-10-CM | POA: Diagnosis not present

## 2020-07-01 DIAGNOSIS — M25512 Pain in left shoulder: Secondary | ICD-10-CM

## 2020-07-01 DIAGNOSIS — R293 Abnormal posture: Secondary | ICD-10-CM | POA: Diagnosis not present

## 2020-07-01 DIAGNOSIS — M6281 Muscle weakness (generalized): Secondary | ICD-10-CM | POA: Diagnosis not present

## 2020-07-01 NOTE — Therapy (Signed)
[place], [place], [address] Phone: [phone]   Fax:  [phone]  Physical Therapy Treatment  Patient Details  Name: Danielle Hernandez MRN: [id number] Date of Birth: 03/29/1935 Referring Provider (PT): Erskine Emery, PA-C   Encounter Date: 07/01/2020   PT End of Session - 07/01/20 1321    Visit Number 12    Number of Visits 16    Date for PT Re-Evaluation 08/12/20    Authorization Type [employer] Medicare    Authorization Time Period FOTO visit #6 and #10, [place] Modifier visit 15    Progress Note Due on Visit 18   completed on visit 8   PT Start Time 1315    PT Stop Time 1400    PT Time Calculation (min) 45 min    Activity Tolerance Patient tolerated treatment well    Behavior During Therapy [facility] for tasks assessed/performed           Past Medical History:  Diagnosis Date  . Arthritis   . Headache(784.0)   . HTN (hypertension)   . Osteoporosis   . Raynaud disease     Past Surgical History:  Procedure Laterality Date  . ABDOMINAL HYSTERECTOMY    . BACK SURGERY    . TONSILLECTOMY    . under chin cyst removed    . WRIST GANGLION EXCISION     right hand    There were no vitals filed for this visit.   Subjective Assessment - 07/01/20 1319    Subjective Pt reports "I got on the floor to stretch my chest out to try the new exercises, but had to crawl to get up and I won't do that again. I went to the bed the next time. I got an injection in my back and hope it helps me get up more easily".    Limitations Lifting;House hold activities    Diagnostic tests XR: Significant consolidation of the proximal humerus fracture. No change in overall position alignment.  Shoulder is well  located.    Patient Stated Goals Be able to get my clothes on, get the pain to go away    Currently in Pain? Yes    Pain Score 3     Pain Location Shoulder    Pain Orientation Left    Pain Descriptors / Indicators Aching;Sore     Pain Onset More than a month ago              [facility] PT Assessment - 07/01/20 0001      PROM   Left Shoulder Flexion 135 Degrees    Left Shoulder ABduction 85 Degrees    Left Shoulder External Rotation 45 Degrees                         [facility] Adult PT Treatment/Exercise - 07/01/20 0001      Shoulder Exercises: Supine   Horizontal ABduction Both;12 reps;Theraband    Theraband Level (Shoulder Horizontal ABduction) Level 1 (Yellow)    External Rotation AAROM;Left;20 reps    External Rotation Limitations with dowel into ER at 0 and 45   5" holds   Flexion AAROM;Both;20 reps   some shakiness in L shoulder at end ROM   Shoulder Flexion Weight (lbs) 1    Other Supine Exercises supine ER with yellow theraband 12x5"      Shoulder Exercises: Seated   External Rotation 10 reps   10"   External Rotation Limitations functional  reaching behind with opp UE to assist at elbow      Shoulder Exercises: ROM/Strengthening   UBE (Upper Arm Bike) L1.5 2.5' fwd, 2.5' bkwd   "it's easier going backward"     Manual Therapy   Manual Therapy Joint mobilization;Soft tissue mobilization;Passive ROM;Myofascial release    Manual therapy comments supine    Joint Mobilization grade 2-3 inf/post GJH mobs    Soft tissue mobilization STM to L UT    Passive ROM all planes, focused on ER                    PT Short Term Goals - 06/17/20 1625      PT SHORT TERM GOAL #1   Title Pt will be I and compliant with initial HEP.    Baseline compliant, still has questions about proper form    Time 3    Period Weeks    Status Partially Met    Target Date 05/20/20      PT SHORT TERM GOAL #2   Title Pt will increase L shoulder PROM to [facility] with </= 4/10 pain.    Baseline see flowsheet, still guarded and painful lacking abduction and ER ROM > flexion and IR    Time 4    Period Weeks    Status On-going    Target Date 05/27/20      PT SHORT TERM GOAL #3   Title Pt will initiate AROM of  shoulder, demonstrating ability to reach to shoulder height to improve ADL participation for bathing and grooming .    Baseline pt can reach to >90 flexion actively    Time 4    Period Weeks    Status Achieved    Target Date 05/27/20             PT Long Term Goals - 06/17/20 1627      PT LONG TERM GOAL #1   Title Pt will be independent with advanced HEP for maintenance.    Time 8    Period Weeks    Status On-going      PT LONG TERM GOAL #2   Title Pt will demonstrate AROM WFL, in order to increase independence with all ADLs (don/doff bra, fix hair), and IADLs (meal prep, moderate cleaning).    Baseline needs help with hair, all meal prep and cleaning (household mgmt)    Time 8    Period Weeks    Status On-going      PT LONG TERM GOAL #3   Title Pt will demonstrate at least 4/5 L shoulder MMT without pain.    Baseline see flowsheet    Time 8    Period Weeks    Status On-going      PT LONG TERM GOAL #4   Title Pt will increase FOTO ability to at least 58% ability, in order to demonstrate meaningful change in perceived level of functional ability.    Baseline 36% ability at initial evaluation 04/29/20, 59% ability 06/17/20    Time 8    Period Weeks    Status Achieved      PT LONG TERM GOAL #5   Title Pt will be able to use curling iron to do her hair.    Baseline unable to use    Time 5    Period Weeks    Status On-going                 Plan - 07/01/20 1322  Clinical Impression Statement Danielle Hernandez has decreased pain level from 7-3/10 in L shoulder over the last 5 visits and has gradual improvement in ROM. She continues to have pain at end ranges and firm end feels with increased muscle guarding and joint stiffness. Able to progress to weight AAROM flexion in supine today with evident L shoulder weakness via shaky unsteady movement that improved with rep count. She is tolerating gentle strengthening well, reporting some muscle soreness in her shoulder. Transfers from  sit <> supine were much easier and pt tolerated supine position for longer period today following back injection yesterday. She continues to need mod verbal/tactile cueing to remember exercise form and perform appropriately without avoiding painful ROM with stretching.    Personal Factors and Comorbidities Age;Education;Time since onset of injury/illness/exacerbation;Fitness;Comorbidity 3+    Comorbidities Arthritis, Osteoporosis, HTN, HOH    Examination-Activity Limitations Bathing;Dressing;Reach Overhead    Examination-Participation Restrictions Cleaning;Laundry;Meal Prep;Shop;Community Activity    Stability/Clinical Decision Making Evolving/Moderate complexity    Rehab Potential Good    PT Frequency 2x / week    PT Duration 8 weeks    PT Treatment/Interventions ADLs/Self Care Home Management;Aquatic Therapy;Cryotherapy;Electrical Stimulation;Iontophoresis 64m/ml Dexamethasone;Moist Heat;Balance training;Gait training;Therapeutic activities;Functional mobility training;Therapeutic exercise;Neuromuscular re-education;Patient/family education;Manual techniques;Passive range of motion;Dry needling;Taping;Vasopneumatic Device    PT Next Visit Plan Assess response to HEP/update PRN, ROM of shoulder to tolerance, shoulder and periscapular strengthening as tolerated, postural endurance/strength, manual therapy to address soft tissue and joint restrictions PRN, abd and ER most limited    PT Home Exercise Plan [email or web address]    Consulted and Agree with Plan of Care Patient           Patient will benefit from skilled therapeutic intervention in order to improve the following deficits and impairments:  Decreased balance,Decreased mobility,Decreased activity tolerance,Decreased strength,Increased fascial restricitons,Impaired flexibility,Impaired UE functional use,Postural dysfunction,Pain,Improper body mechanics,Impaired tone,Impaired perceived functional ability,Decreased range of motion  Visit  Diagnosis: Acute pain of left shoulder  Abnormal posture  Muscle weakness (generalized)  Difficulty in walking, not elsewhere classified     Problem List Patient Active Problem List   Diagnosis Date Noted  . CDNH (chondrodermatitis nodularis helicis), right 048/01/6551 . Impacted cerumen of left ear 03/04/2016  . Fecal incontinence 04/26/2012  . Lumbar stenosis 02/08/2011    KPhill Myron TYvette Rack PT, DPT 07/01/2020, 2:33 PM  [facility] [address]Phone: [phone]  Fax:  [phone] Name: FFATOU DUNNIGANMRN: [id number] of Birth: 2August 31, 1937

## 2020-07-11 ENCOUNTER — Other Ambulatory Visit: Payer: Self-pay

## 2020-07-11 ENCOUNTER — Ambulatory Visit: Payer: Medicare Other

## 2020-07-11 DIAGNOSIS — R262 Difficulty in walking, not elsewhere classified: Secondary | ICD-10-CM

## 2020-07-11 DIAGNOSIS — M25512 Pain in left shoulder: Secondary | ICD-10-CM

## 2020-07-11 DIAGNOSIS — M6281 Muscle weakness (generalized): Secondary | ICD-10-CM | POA: Diagnosis not present

## 2020-07-11 DIAGNOSIS — R293 Abnormal posture: Secondary | ICD-10-CM

## 2020-07-11 NOTE — Therapy (Signed)
[place], [place], [address] Phone: [phone]   Fax:  [phone]  Physical Therapy Treatment  Patient Details  Name: Danielle Hernandez MRN: [id number] Date of Birth: Jun 23, 1935 Referring Provider (PT): Erskine Emery, PA-C   Encounter Date: 07/11/2020   PT End of Session - 07/11/20 1336     Visit Number 13    Number of Visits 20    Date for PT Re-Evaluation 08/12/20    Authorization Type [employer] Medicare    Authorization Time Period FOTO visit #6 and #10, Simpson Modifier visit 15    Progress Note Due on Visit 18   completed on visit 8   PT Start Time 1320    PT Stop Time 1405    PT Time Calculation (min) 45 min    Activity Tolerance Patient tolerated treatment well    Behavior During Therapy WFL for tasks assessed/performed             Past Medical History:  Diagnosis Date   Arthritis    Headache(784.0)    HTN (hypertension)    Osteoporosis    Raynaud disease     Past Surgical History:  Procedure Laterality Date   ABDOMINAL HYSTERECTOMY     BACK SURGERY     TONSILLECTOMY     under chin cyst removed     WRIST GANGLION EXCISION     right hand    There were no vitals filed for this visit.   Subjective Assessment - 07/11/20 1331     Subjective Pt reports she was able to use her left arm to help with her curling iron this morning to fix her bangs before getting her hair styled. She felt some pain and it was harder than normal.    Limitations Lifting;House hold activities    Diagnostic tests XR: Significant consolidation of the proximal humerus fracture. No change in overall position alignment.  Shoulder is well  located.    Patient Stated Goals Be able to get my clothes on, get the pain to go away    Currently in Pain? No/denies    Pain Score 0-No pain    Pain Location Shoulder    Pain Onset More than a month ago                [facility] PT Assessment - 07/11/20 0001       AROM   Left  Shoulder Flexion 116 Degrees    Left Shoulder ABduction 110 Degrees    Left Shoulder Internal Rotation --   functional IR to L2   Left Shoulder External Rotation --   functional ER to T1     PROM   Left Shoulder Flexion 140 Degrees   122-130 post manual, 140 post session   Left Shoulder ABduction 92 Degrees    Left Shoulder Internal Rotation 67 Degrees    Left Shoulder External Rotation 47 Degrees                           [facility] Adult PT Treatment/Exercise - 07/11/20 0001       Shoulder Exercises: Supine   External Rotation AAROM;Left;20 reps    External Rotation Limitations with dowel into 90/90 (therapist assist to stabilize humerus)    Flexion AAROM;Both;20 reps   some shakiness in L shoulder at end ROM   Shoulder Flexion Weight (lbs) 2      Shoulder Exercises: Seated   Horizontal ABduction Strengthening;12  reps;Theraband    Theraband Level (Shoulder Horizontal ABduction) Level 1 (Yellow)    Horizontal ABduction Limitations cues to maintain arms below shoulder height to reduce upper trap compensation and retract scapulae    External Rotation Strengthening;15 reps;Theraband    Theraband Level (Shoulder External Rotation) Level 1 (Yellow)    External Rotation Limitations + retraction      Shoulder Exercises: ROM/Strengthening   UBE (Upper Arm Bike) L1.5 4' (switching fwd/bkwd about every minute)      Manual Therapy   Manual Therapy Myofascial release;Passive ROM    Manual therapy comments supine    Myofascial Release to subscap with PROM L shoulder flexion    Passive ROM all planes, focus on flexion stretching                      PT Short Term Goals - 07/11/20 1431       PT SHORT TERM GOAL #1   Title Pt will be I and compliant with initial HEP.    Time 3    Period Weeks    Status Achieved    Target Date 05/20/20      PT SHORT TERM GOAL #2   Title Pt will increase L shoulder PROM to [facility] with </= 4/10 pain.    Baseline see flowsheet,  some guarding but minimal pain    Time 4    Period Weeks    Status Achieved    Target Date 05/27/20      PT SHORT TERM GOAL #3   Title Pt will initiate AROM of shoulder, demonstrating ability to reach to shoulder height to improve ADL participation for bathing and grooming .    Baseline pt can reach to >90 flexion actively    Time 4    Period Weeks    Status Achieved    Target Date 05/27/20               PT Long Term Goals - 07/11/20 1429       PT LONG TERM GOAL #1   Title Pt will be independent with advanced HEP for maintenance.    Time 8    Period Weeks    Status On-going    Target Date 08/08/20      PT LONG TERM GOAL #2   Title Pt will demonstrate AROM WFL, in order to increase independence with all ADLs (don/doff bra, fix hair), and IADLs (meal prep, moderate cleaning).    Baseline needs help with meal prep and cleaning (household mgmt)    Time 8    Period Weeks    Status On-going    Target Date 08/08/20      PT LONG TERM GOAL #3   Title Pt will demonstrate at least 4/5 L shoulder MMT without pain.    Baseline NT    Time 8    Period Weeks    Status On-going    Target Date 08/08/20      PT LONG TERM GOAL #4   Title Pt will increase FOTO ability to at least 58% ability, in order to demonstrate meaningful change in perceived level of functional ability.    Baseline 36% ability at initial evaluation 04/29/20, 59% ability 06/17/20    Time 8    Period Weeks    Status Achieved      PT LONG TERM GOAL #5   Title Pt will be able to use curling iron to do her hair.    Baseline used L  UE to assist this AM with pain reported and difficulty for brief period    Time 4    Period Weeks    Status On-going    Target Date 08/08/20                   Plan - 07/11/20 1336     Clinical Impression Statement Gloris presents with no pain today and partially met LTG # 5, per pt report she was able to assist her R UE with curling her bangs this morning. She did c/o mild  pain and difficulty performing. She tolerated tx well with no adverse reactions, brief c/o pain with end range stretching. Progressed supine strength to sitting with cues provided for horizontal abduction to reduce L UT compensation.    Personal Factors and Comorbidities Age;Education;Time since onset of injury/illness/exacerbation;Fitness;Comorbidity 3+    Comorbidities Arthritis, Osteoporosis, HTN, HOH    Examination-Activity Limitations Bathing;Dressing;Reach Overhead    Examination-Participation Restrictions Cleaning;Laundry;Meal Prep;Shop;Community Activity    Stability/Clinical Decision Making Evolving/Moderate complexity    Rehab Potential Good    PT Frequency 2x / week    PT Duration 8 weeks    PT Treatment/Interventions ADLs/Self Care Home Management;Aquatic Therapy;Cryotherapy;Electrical Stimulation;Iontophoresis 32m/ml Dexamethasone;Moist Heat;Balance training;Gait training;Therapeutic activities;Functional mobility training;Therapeutic exercise;Neuromuscular re-education;Patient/family education;Manual techniques;Passive range of motion;Dry needling;Taping;Vasopneumatic Device    PT Next Visit Plan Assess response to HEP/update PRN, restore A/PROM to shoulder, shoulder and periscapular strengthening as tolerated, postural endurance/strength, manual therapy to address soft tissue and joint restrictions PRN    PT Home Exercise Plan [email or web address]    Consulted and Agree with Plan of Care Patient             Patient will benefit from skilled therapeutic intervention in order to improve the following deficits and impairments:  Decreased balance, Decreased mobility, Decreased activity tolerance, Decreased strength, Increased fascial restricitons, Impaired flexibility, Impaired UE functional use, Postural dysfunction, Pain, Improper body mechanics, Impaired tone, Impaired perceived functional ability, Decreased range of motion  Visit Diagnosis: Acute pain of left shoulder  Abnormal  posture  Muscle weakness (generalized)  Difficulty in walking, not elsewhere classified     Problem List Patient Active Problem List   Diagnosis Date Noted   [facility] right 033/61/2244  Impacted cerumen of left ear 03/04/2016   Fecal incontinence 04/26/2012   Lumbar stenosis 02/08/2011    KIzell Galatia PT, DPT 07/11/2020, 2:35 PM  CBadinCMad[facility] Inc[address] [address]Phone: [phone]  Fax:  [phone] Name: FWESTON FULCOMRN: [id number] of Birth: 211/20/1937

## 2020-07-15 ENCOUNTER — Ambulatory Visit: Payer: Medicare Other | Admitting: Physical Therapy

## 2020-07-17 ENCOUNTER — Other Ambulatory Visit: Payer: Self-pay

## 2020-07-17 ENCOUNTER — Encounter: Payer: Self-pay | Admitting: Physical Therapy

## 2020-07-17 ENCOUNTER — Ambulatory Visit: Payer: Medicare Other | Admitting: Physical Therapy

## 2020-07-17 DIAGNOSIS — M6281 Muscle weakness (generalized): Secondary | ICD-10-CM

## 2020-07-17 DIAGNOSIS — M25512 Pain in left shoulder: Secondary | ICD-10-CM | POA: Diagnosis not present

## 2020-07-17 DIAGNOSIS — R262 Difficulty in walking, not elsewhere classified: Secondary | ICD-10-CM

## 2020-07-17 DIAGNOSIS — R293 Abnormal posture: Secondary | ICD-10-CM

## 2020-07-17 NOTE — Therapy (Addendum)
[place], [place], [address] Phone: [phone]   Fax:  [phone]  Physical Therapy Treatment  Patient Details  Name: Danielle Hernandez MRN: [id number] Date of Birth: [birth year] Referring Provider (PT): Erskine Emery, PA-C   Encounter Date: 07/17/2020   PT End of Session - 07/17/20 1102     Visit Number 14    Number of Visits 20    Date for PT Re-Evaluation 08/12/20    Authorization Type [employer] Medicare    Authorization Time Period FOTO visit #6 and #10, KX Modifier visit 15    PT Start Time 1100    PT Stop Time 1156    PT Time Calculation (min) 56 min    Activity Tolerance Patient tolerated treatment well    Behavior During Therapy WFL for tasks assessed/performed             Past Medical History:  Diagnosis Date   Arthritis    Headache(784.0)    HTN (hypertension)    Osteoporosis    Raynaud disease     Past Surgical History:  Procedure Laterality Date   ABDOMINAL HYSTERECTOMY     BACK SURGERY     TONSILLECTOMY     under chin cyst removed     WRIST GANGLION EXCISION     right hand    There were no vitals filed for this visit.   Subjective Assessment - 07/17/20 1108     Subjective I have a little bit of pain in my shoulder today.  I almost used my curling iron better today    Limitations Lifting;House hold activities    Diagnostic tests XR: Significant consolidation of the proximal humerus fracture. No change in overall position alignment.  Shoulder is well  located.    Currently in Pain? Yes    Pain Score 3     Pain Location Shoulder    Pain Orientation Left    Pain Descriptors / Indicators Aching    Pain Type Acute pain                [facility] PT Assessment - 07/17/20 0001       AROM   Left Shoulder Flexion 130 Degrees   119 before session   Left Shoulder ABduction 111 Degrees      PROM   Right Shoulder Flexion 142 Degrees    Left Shoulder Flexion 135 Degrees   before  PT 119   Left Shoulder ABduction 95 Degrees    Left Shoulder Internal Rotation 67 Degrees    Left Shoulder External Rotation 69 Degrees               trigger point dry needling  Yes for consent and given educational handout Levator, Upper trap, subscapularis, Lat dorsi   50 mm 30 gage needle   twitch response and palpable lengthening            [facility] Adult PT Treatment/Exercise - 07/17/20 0001       Self-Care   Self-Care Other Self-Care Comments    Other Self-Care Comments  educated on TPDN and precautians and aftercare      Shoulder Exercises: Supine   Horizontal ABduction Both;12 reps;Theraband    Theraband Level (Shoulder Horizontal ABduction) Level 1 (Yellow)    Horizontal ABduction Limitations Pt needs constant supervision    External Rotation AAROM;Left;20 reps    External Rotation Limitations with dowel into 90/90 (therapist assist to stabilize humerus)    Flexion AAROM;Both;20 reps  Shoulder Flexion Weight (lbs) 2    Other Supine Exercises supine ER with yellow theraband 12x5"      Shoulder Exercises: Seated   External Rotation Strengthening;15 reps;Theraband    Theraband Level (Shoulder External Rotation) Level 1 (Yellow)    External Rotation Limitations + retraction with towel rolls under shld      Shoulder Exercises: ROM/Strengthening   UBE (Upper Arm Bike) L1.5 4' (switching fwd/bkwd about every minute)      Modalities   Modalities Moist Heat      Moist Heat Therapy   Number Minutes Moist Heat 13 Minutes    Moist Heat Location Shoulder   L     Manual Therapy   Manual Therapy Joint mobilization;Soft tissue mobilization    Manual therapy comments skilled palpation with TPDN    Joint Mobilization grade 2-3 inf/post GJH mobs    Soft tissue mobilization to subscapular L    Myofascial Release to subscap with PROM L shoulder flexion    Passive ROM all planes, focus on flexion stretching            Access Code: [email or web address]:  [email or web address]: 06/23/2022Prepared by: Donnetta Simpers BeardsleyExercises   Standing Shoulder Extension with Resistance - 1 x daily - 7 x weekly - 2 sets - 10 reps  Shoulder External Rotation and Scapular Retraction with Resistance - 1 x daily - 7 x weekly - 2 sets - 10 reps     PT Education - 07/17/20 1125     Education Details Educated on TPDN  and continued current HEP    Person(s) Educated Patient    Methods Explanation;Demonstration;Tactile cues;Verbal cues;Handout    Comprehension Verbalized understanding;Returned demonstration              PT Short Term Goals - 07/11/20 1431       PT SHORT TERM GOAL #1   Title Pt will be I and compliant with initial HEP.    Time 3    Period Weeks    Status Achieved    Target Date 05/20/20      PT SHORT TERM GOAL #2   Title Pt will increase L shoulder PROM to [facility] with </= 4/10 pain.    Baseline see flowsheet, some guarding but minimal pain    Time 4    Period Weeks    Status Achieved    Target Date 05/27/20      PT SHORT TERM GOAL #3   Title Pt will initiate AROM of shoulder, demonstrating ability to reach to shoulder height to improve ADL participation for bathing and grooming .    Baseline pt can reach to >90 flexion actively    Time 4    Period Weeks    Status Achieved    Target Date 05/27/20               PT Long Term Goals - 07/11/20 1429       PT LONG TERM GOAL #1   Title Pt will be independent with advanced HEP for maintenance.    Time 8    Period Weeks    Status On-going    Target Date 08/08/20      PT LONG TERM GOAL #2   Title Pt will demonstrate AROM WFL, in order to increase independence with all ADLs (don/doff bra, fix hair), and IADLs (meal prep, moderate cleaning).    Baseline needs help with meal prep and cleaning (household mgmt)    Time 8    Period Weeks  Status On-going    Target Date 08/08/20      PT LONG TERM GOAL #3   Title Pt will demonstrate at least 4/5 L shoulder  MMT without pain.    Baseline NT    Time 8    Period Weeks    Status On-going    Target Date 08/08/20      PT LONG TERM GOAL #4   Title Pt will increase FOTO ability to at least 58% ability, in order to demonstrate meaningful change in perceived level of functional ability.    Baseline 36% ability at initial evaluation 04/29/20, 59% ability 06/17/20    Time 8    Period Weeks    Status Achieved      PT LONG TERM GOAL #5   Title Pt will be able to use curling iron to do her hair.    Baseline used L UE to assist this AM with pain reported and difficulty for brief period    Time 4    Period Weeks    Status On-going    Target Date 08/08/20                   Plan - 07/17/20 1106     Clinical Impression Statement Ms Korber presents wth 3/10 pain in Left shld ( sub scapularis)  she states she tried to bring curling iron to head but was not able to complete due to pain and decreased AROM.  Pt needs mod cuing and correction to perform exercises well and properly.  PT demo standing exercises with yellow t band today.  Pt educated on TPDN and had no adverse reactions to TPDN.  Pt closely monitored throughout session and had decreased pain after TPDN and increased  ability to raise arm. AROM left flexion130 abd 111    Personal Factors and Comorbidities Age;Education;Time since onset of injury/illness/exacerbation;Fitness;Comorbidity 3+    Comorbidities Arthritis, Osteoporosis, HTN, HOH    Examination-Activity Limitations Bathing;Dressing;Reach Overhead    Examination-Participation Restrictions Cleaning;Laundry;Meal Prep;Shop;Community Activity    PT Frequency 2x / week    PT Duration 8 weeks    PT Treatment/Interventions ADLs/Self Care Home Management;Aquatic Therapy;Cryotherapy;Electrical Stimulation;Iontophoresis 4mg /ml Dexamethasone;Moist Heat;Balance training;Gait training;Therapeutic activities;Functional mobility training;Therapeutic exercise;Neuromuscular re-education;Patient/family  education;Manual techniques;Passive range of motion;Dry needling;Taping;Vasopneumatic Device    PT Next Visit Plan Assess response to HEP/update PRN, restore A/PROM to shoulder, shoulder and periscapular strengthening as tolerated, postural endurance/strength, manual therapy to address soft tissue and joint restrictions PRN    PT Home Exercise Plan [email or web address]             Patient will benefit from skilled therapeutic intervention in order to improve the following deficits and impairments:  Decreased balance, Decreased mobility, Decreased activity tolerance, Decreased strength, Increased fascial restricitons, Impaired flexibility, Impaired UE functional use, Postural dysfunction, Pain, Improper body mechanics, Impaired tone, Impaired perceived functional ability, Decreased range of motion  Visit Diagnosis: Acute pain of left shoulder  Abnormal posture  Muscle weakness (generalized)  Difficulty in walking, not elsewhere classified     Problem List Patient Active Problem List   Diagnosis Date Noted   [facility] (chondrodermatitis nodularis helicis), right 40/98/1191   Impacted cerumen of left ear 03/04/2016   Fecal incontinence 04/26/2012   Lumbar stenosis 02/08/2011  Voncille Lo, PT, [facility] Certified Exercise Expert for the Aging Adult  07/17/20 12:06 PM Phone: [phone] Fax: [place] [facility] [address] [place], [place], [address] Phone: [phone]   Fax:  [phone]  Name: Danielle Hernandez MRN: [id number] Date of Birth: 11-06-1935

## 2020-07-17 NOTE — Patient Instructions (Addendum)

## 2020-07-22 ENCOUNTER — Encounter: Payer: Medicare Other | Admitting: Physical Therapy

## 2020-07-22 DIAGNOSIS — M5136 Other intervertebral disc degeneration, lumbar region: Secondary | ICD-10-CM | POA: Diagnosis not present

## 2020-07-22 DIAGNOSIS — M419 Scoliosis, unspecified: Secondary | ICD-10-CM | POA: Diagnosis not present

## 2020-07-22 DIAGNOSIS — M5416 Radiculopathy, lumbar region: Secondary | ICD-10-CM | POA: Diagnosis not present

## 2020-07-24 ENCOUNTER — Encounter: Payer: Self-pay | Admitting: Physical Therapy

## 2020-07-24 ENCOUNTER — Other Ambulatory Visit: Payer: Self-pay

## 2020-07-24 ENCOUNTER — Ambulatory Visit: Payer: Medicare Other | Admitting: Physical Therapy

## 2020-07-24 DIAGNOSIS — R293 Abnormal posture: Secondary | ICD-10-CM | POA: Diagnosis not present

## 2020-07-24 DIAGNOSIS — R262 Difficulty in walking, not elsewhere classified: Secondary | ICD-10-CM | POA: Diagnosis not present

## 2020-07-24 DIAGNOSIS — M25512 Pain in left shoulder: Secondary | ICD-10-CM | POA: Diagnosis not present

## 2020-07-24 DIAGNOSIS — M6281 Muscle weakness (generalized): Secondary | ICD-10-CM | POA: Diagnosis not present

## 2020-07-24 NOTE — Patient Instructions (Addendum)
Over Head Pull: Narrow Grip       On back, knees bent, feet flat, band across thighs, elbows straight but relaxed. Pull hands apart (start). Keeping elbows straight, bring arms up and over head, hands toward floor. Keep pull steady on band. Hold momentarily. Return slowly, keeping pull steady, back to start. Repeat _10__ times. Band color _red_____   Side Pull: Double Arm   On back, knees bent, feet flat. Arms perpendicular to body, shoulder level, elbows straight but relaxed. Pull arms out to sides, elbows straight. Resistance band comes across collarbones, hands toward floor. Hold momentarily. Slowly return to starting position. Repeat _10__ times. Band color __red___   Elmer Picker   On back, knees bent, feet flat, left hand on left hip, right hand above left. Pull right arm DIAGONALLY (hip to shoulder) across chest. Bring right arm along head toward floor. Hold momentarily. Slowly return to starting position. Repeat _10__ times. Do with left arm. Band color __red____   Shoulder Rotation: Double Arm   On back, knees bent, feet flat, elbows tucked at sides, bent 90, hands palms up. Pull hands apart and down toward floor, keeping elbows near sides. Hold momentarily. Slowly return to starting position. Repeat _10__ times. Band color __red____   Sleep Tips    Keep a consistent sleep schedule. Get up at the same time every day, even on weekends or during vacations. Set a bedtime that is early enough for you to get at least 7 hours of sleep. Don't go to bed unless you are sleepy.  If you don't fall asleep after 20 minutes, get out of bed.  Establish a relaxing bedtime routine.  Use your bed only for sleep and sex.  Make your bedroom quiet and relaxing. Keep the room at a comfortable, cool temperature.  Limit exposure to bright light in the evenings. Turn off electronic devices at least 30 minutes before bedtime. Don't eat a large meal before bedtime. If you are hungry at night, eat a light,  healthy snack.  Exercise regularly and maintain a healthy diet.  Avoid consuming caffeine in the late afternoon or evening.  Avoid consuming alcohol before bedtime.  Reduce your fluid intake before bedtime.  Voncille Lo, PT, [place] Certified Exercise Expert for the Aging Adult  07/24/20 11:36 AM Phone: [phone] Fax: [phone]

## 2020-07-24 NOTE — Therapy (Signed)
[place], [place], [address] Phone: [phone]   Fax:  [phone]  Physical Therapy Treatment  Patient Details  Name: Danielle Hernandez MRN: [id number] Date of Birth: [birth year] Referring Provider (PT): Erskine Emery, PA-C   Encounter Date: 07/24/2020   PT End of Session - 07/24/20 1134     Visit Number 15    Number of Visits 20    Date for PT Re-Evaluation 08/12/20    Authorization Type [employer] Medicare    Authorization Time Period FOTO visit #6 and #10, KX Modifier visit 15    PT Start Time 1100    PT Stop Time 1155    PT Time Calculation (min) 55 min    Activity Tolerance Patient tolerated treatment well    Behavior During Therapy WFL for tasks assessed/performed             Past Medical History:  Diagnosis Date   Arthritis    Headache(784.0)    HTN (hypertension)    Osteoporosis    Raynaud disease     Past Surgical History:  Procedure Laterality Date   ABDOMINAL HYSTERECTOMY     BACK SURGERY     TONSILLECTOMY     under chin cyst removed     WRIST GANGLION EXCISION     right hand    There were no vitals filed for this visit.   Subjective Assessment - 07/24/20 1108     Subjective I sleep a lot in the day. I can sit in my sofa and I will just fall asleep.  I go to the bathroom a lot at night.  I dont have pain now but I still am tight.  I am able to use a curling iron but not like I want to yet.    Limitations Lifting;House hold activities    Diagnostic tests XR: Significant consolidation of the proximal humerus fracture. No change in overall position alignment.  Shoulder is well  located.    Patient Stated Goals Be able to get my clothes on, get the pain to go away    Currently in Pain? Yes    Pain Score 1     Pain Location Shoulder    Pain Orientation Left    Pain Descriptors / Indicators Tightness    Pain Type Chronic pain    Pain Onset More than a month ago    Pain Frequency  Intermittent                [facility] PT Assessment - 07/24/20 0001       AROM   Left Shoulder Flexion 133 Degrees   119 before session   Left Shoulder ABduction 114 Degrees      PROM   Right Shoulder Flexion 144 Degrees    Left Shoulder Flexion 135 Degrees   before PT 119   Left Shoulder ABduction 116 Degrees    Left Shoulder Internal Rotation 67 Degrees    Left Shoulder External Rotation 75 Degrees                           [facility] Adult PT Treatment/Exercise - 07/24/20 0001       Self-Care   Self-Care Other Self-Care Comments    Other Self-Care Comments  slep hygiene      Shoulder Exercises: Supine   Horizontal ABduction Both;12 reps;Theraband    Theraband Level (Shoulder Horizontal ABduction) Level 1 (Yellow);Level 2 (Red)  Horizontal ABduction Limitations Pt needs constant supervision    External Rotation AAROM;Left;20 reps    External Rotation Limitations --    Flexion --    Shoulder Flexion Weight (lbs) --    Other Supine Exercises supine scap stabilizers with red t band bil flex, horizontal abd  diagonals  bil ER  Vc and TC      Shoulder Exercises: Standing   Other Standing Exercises pillow case on hands  UE wall slides to end range of flexion and abduction      Modalities   Modalities Moist Heat      Moist Heat Therapy   Number Minutes Moist Heat 13 Minutes    Moist Heat Location Shoulder   L concurrent with exercise     Manual Therapy   Manual Therapy Joint mobilization;Soft tissue mobilization    Manual therapy comments skilled palpation with TPDN    Joint Mobilization grade 2-3 inf/post GJH mobs    Soft tissue mobilization to subscapular L, lats, UT and levator Left    Myofascial Release to subscap with PROM L shoulder flexion    Passive ROM all planes, focus on flexion stretching              Trigger Point Dry Needling - 07/24/20 0001     Consent Given? Yes    Education Handout Provided Previously provided    Muscles  Treated Head and Neck Upper trapezius;Levator scapulae   L only   Muscles Treated Upper Quadrant Subscapularis;Latissimus dorsi   Left only   Dry Needling Comments 50 mm 30 gage    Upper Trapezius Response Twitch reponse elicited;Palpable increased muscle length    Levator Scapulae Response Twitch response elicited;Palpable increased muscle length    Subscapularis Response Twitch response elicited;Palpable increased muscle length    Latissimus dorsi Response Twitch response elicited;Palpable increased muscle length                  PT Education - 07/24/20 1133     Education Details Educated on Sleep hygiene and added supine scapular stabilizers ( horizontal abd/ diagnals in supine to HEP    Person(s) Educated Patient    Methods Explanation;Demonstration;Tactile cues;Verbal cues;Handout    Comprehension Verbalized understanding;Returned demonstration              PT Short Term Goals - 07/11/20 1431       PT SHORT TERM GOAL #1   Title Pt will be I and compliant with initial HEP.    Time 3    Period Weeks    Status Achieved    Target Date 05/20/20      PT SHORT TERM GOAL #2   Title Pt will increase L shoulder PROM to [facility] with </= 4/10 pain.    Baseline see flowsheet, some guarding but minimal pain    Time 4    Period Weeks    Status Achieved    Target Date 05/27/20      PT SHORT TERM GOAL #3   Title Pt will initiate AROM of shoulder, demonstrating ability to reach to shoulder height to improve ADL participation for bathing and grooming .    Baseline pt can reach to >90 flexion actively    Time 4    Period Weeks    Status Achieved    Target Date 05/27/20               PT Long Term Goals - 07/24/20 1124       PT LONG  TERM GOAL #1   Title Pt will be independent with advanced HEP for maintenance.    Baseline Pt needs min to mod cuing to stay on task and execute exercises properly    Time 8    Period Weeks    Status On-going      PT LONG TERM GOAL #2    Title Pt will demonstrate AROM WFL, in order to increase independence with all ADLs (don/doff bra, fix hair), and IADLs (meal prep, moderate cleaning).    Baseline vacuumed yesterday.  not curling hair like she likes but can use curling iron    Time 8    Period Weeks    Status Partially Met      PT LONG TERM GOAL #3   Title Pt will demonstrate at least 4/5 L shoulder MMT without pain.    Time 8    Period Weeks    Status On-going      PT LONG TERM GOAL #4   Title Pt will increase FOTO ability to at least 58% ability, in order to demonstrate meaningful change in perceived level of functional ability.    Baseline 36% ability at initial evaluation 04/29/20, 59% ability 06/17/20    Time 8    Period Weeks    Status Achieved      PT LONG TERM GOAL #5   Title Pt will be able to use curling iron to do her hair.    Baseline able to use curling iron now after TPDN    Time 4    Period Weeks    Status Achieved                   Plan - 07/24/20 1104     Clinical Impression Statement Ms Gladue presents with 1/10 tightness in Left UE but no pain since TPDN last session.   she has more complaints of Left hip than arms.  Pt needs to be redirected frequently to task during exercises but is able to complete.  Pt complaint of sleeping problems. Waking up several times a night due to need to urinate.  Pt may benefit from Pelvic floor PT to address incontinent issues as well as Hip pain after DC . Pt has one more appt. Pt LTG achieved or partially met  # 2, 4 and 5. Pt has one more appt    Personal Factors and Comorbidities Age;Education;Time since onset of injury/illness/exacerbation;Fitness;Comorbidity 3+    Comorbidities Arthritis, Osteoporosis, HTN, HOH    Examination-Activity Limitations Bathing;Dressing;Reach Overhead    Examination-Participation Restrictions Cleaning;Laundry;Meal Prep;Shop;Community Activity    PT Frequency 2x / week    PT Duration 8 weeks    PT Treatment/Interventions  ADLs/Self Care Home Management;Aquatic Therapy;Cryotherapy;Electrical Stimulation;Iontophoresis 17m/ml Dexamethasone;Moist Heat;Balance training;Gait training;Therapeutic activities;Functional mobility training;Therapeutic exercise;Neuromuscular re-education;Patient/family education;Manual techniques;Passive range of motion;Dry needling;Taping;Vasopneumatic Device    PT Next Visit Plan Assess response to HEP/update PRN, restore A/PROM to shoulder, shoulder and periscapular strengthening as tolerated, postural endurance/strength, manual therapy to address soft tissue and joint restrictions PRN   Pt has Left hip pain and complaint of trouble wiht incontinence at night   Pt may benefit form pelvic floor PT  to address both issues after DC for Left shoulder    PT Home Exercise Plan [email or web address]    Consulted and Agree with Plan of Care Patient             Patient will benefit from skilled therapeutic intervention in order to improve the following deficits and impairments:  Decreased balance, Decreased mobility, Decreased activity tolerance, Decreased strength, Increased fascial restricitons, Impaired flexibility, Impaired UE functional use, Postural dysfunction, Pain, Improper body mechanics, Impaired tone, Impaired perceived functional ability, Decreased range of motion  Visit Diagnosis: Acute pain of left shoulder  Abnormal posture  Muscle weakness (generalized)  Difficulty in walking, not elsewhere classified     Problem List Patient Active Problem List   Diagnosis Date Noted   [facility] (chondrodermatitis nodularis helicis), right 70/96/2836   Impacted cerumen of left ear 03/04/2016   Fecal incontinence 04/26/2012   Lumbar stenosis 02/08/2011  Voncille Lo, PT, [place] Certified Exercise Expert for the Aging Adult  07/24/20 12:07 PM Phone: [phone] Fax: [place] [facility] [address] [place], [place], [address] Phone:  [phone]   Fax:  [phone]  Name: DORRINE MONTONE MRN: [id number] Date of Birth: Jan 15, 1936

## 2020-07-29 ENCOUNTER — Encounter: Payer: Medicare Other | Admitting: Physical Therapy

## 2020-07-31 ENCOUNTER — Encounter: Payer: Medicare Other | Admitting: Physical Therapy

## 2020-08-01 ENCOUNTER — Other Ambulatory Visit: Payer: Self-pay

## 2020-08-01 ENCOUNTER — Ambulatory Visit: Payer: Medicare Other | Attending: Physician Assistant

## 2020-08-01 DIAGNOSIS — R262 Difficulty in walking, not elsewhere classified: Secondary | ICD-10-CM | POA: Diagnosis not present

## 2020-08-01 DIAGNOSIS — R293 Abnormal posture: Secondary | ICD-10-CM | POA: Diagnosis not present

## 2020-08-01 DIAGNOSIS — M25512 Pain in left shoulder: Secondary | ICD-10-CM | POA: Diagnosis not present

## 2020-08-01 DIAGNOSIS — M6281 Muscle weakness (generalized): Secondary | ICD-10-CM | POA: Diagnosis not present

## 2020-08-01 NOTE — Therapy (Addendum)
[place], [place], [address] Phone: [phone]   Fax:  [phone]  Physical Therapy Treatment/Discharge  Patient Details  Name: Danielle Hernandez MRN: [id number] Date of Birth: Aug 20, 1935 Referring Provider (PT): Erskine Emery, PA-C   Encounter Date: 08/01/2020   PT End of Session - 08/01/20 1327     Visit Number 16    Number of Visits 20    Date for PT Re-Evaluation 08/12/20    Authorization Type [employer] Medicare    Authorization Time Period FOTO visit #6 and #10, KX Modifier visit 15    PT Start Time 1315    PT Stop Time 1400    PT Time Calculation (min) 45 min    Activity Tolerance Patient tolerated treatment well    Behavior During Therapy WFL for tasks assessed/performed             Past Medical History:  Diagnosis Date   Arthritis    Headache(784.0)    HTN (hypertension)    Osteoporosis    Raynaud disease     Past Surgical History:  Procedure Laterality Date   ABDOMINAL HYSTERECTOMY     BACK SURGERY     TONSILLECTOMY     under chin cyst removed     WRIST GANGLION EXCISION     right hand    There were no vitals filed for this visit.   Subjective Assessment - 08/01/20 1323     Subjective I am feeling a lot better. "This is is my last visit. With my curling iron, I can get my hand to the top of my head. I still can't quite use it the way I want it, but it's ok". Otherwise, it's feeling almost 100%. The needling really helped.    Limitations Lifting;House hold activities    Diagnostic tests XR: Significant consolidation of the proximal humerus fracture. No change in overall position alignment.  Shoulder is well  located.    Patient Stated Goals Be able to get my clothes on, get the pain to go away    Currently in Pain? No/denies    Pain Score 0-No pain    Pain Location Shoulder    Pain Orientation Left    Pain Onset More than a month ago                                [facility] Adult PT Treatment/Exercise - 08/01/20 0001       Self-Care   Self-Care Other Self-Care Comments    Other Self-Care Comments  discussed calling doctor for pelvic floor PT referral to work on bladder control and L hip pain; reviewed goals and HEP      Shoulder Exercises: Supine   Horizontal ABduction Both;12 reps;Theraband    Theraband Level (Shoulder Horizontal ABduction) Level 2 (Red)    Horizontal ABduction Limitations Pt needs constant supervision    External Rotation AAROM;Left;20 reps    Other Supine Exercises supine scap stabilizers with red t band bil flex, horizontal abd  diagonals  bil ER  Vc and TC                    PT Education - 08/01/20 1412     Education Details see self care    Person(s) Educated Patient    Methods Explanation;Demonstration;Tactile cues;Verbal cues    Comprehension Verbalized understanding;Returned demonstration;Verbal cues required;Tactile cues required  PT Short Term Goals - 08/01/20 1336       PT SHORT TERM GOAL #1   Title Pt will be I and compliant with initial HEP.    Time 3    Period Weeks    Status Achieved    Target Date 05/20/20      PT SHORT TERM GOAL #2   Title Pt will increase L shoulder PROM to [facility] with </= 4/10 pain.    Baseline see flowsheet, some guarding but minimal pain    Time 4    Period Weeks    Status Achieved    Target Date 05/27/20      PT SHORT TERM GOAL #3   Title Pt will initiate AROM of shoulder, demonstrating ability to reach to shoulder height to improve ADL participation for bathing and grooming .    Baseline pt can reach to >90 flexion actively    Time 4    Period Weeks    Status Achieved    Target Date 05/27/20               PT Long Term Goals - 08/01/20 1336       PT LONG TERM GOAL #1   Title Pt will be independent with advanced HEP for maintenance.    Baseline Pt needs min to mod cuing to stay on task and execute  exercises properly    Time 8    Period Weeks    Status Partially Met      PT LONG TERM GOAL #2   Title Pt will demonstrate AROM WFL, in order to increase independence with all ADLs (don/doff bra, fix hair), and IADLs (meal prep, moderate cleaning).    Baseline Is able to take care of herself; uses a shower chair and can't quite curl hair like she wants, but she can do everything MOD I    Time 8    Period Weeks    Status Achieved      PT LONG TERM GOAL #3   Title Pt will demonstrate at least 4/5 L shoulder MMT without pain.    Baseline 4+/5 flexion, abduction and IR; 4/5 ER    Time 8    Period Weeks    Status Achieved      PT LONG TERM GOAL #4   Title Pt will increase FOTO ability to at least 58% ability, in order to demonstrate meaningful change in perceived level of functional ability.    Baseline 36% ability at initial evaluation 04/29/20, 59% ability 06/17/20    Time 8    Period Weeks    Status Achieved      PT LONG TERM GOAL #5   Title Pt will be able to use curling iron to do her hair.    Baseline able to use curling iron now after TPDN    Time 4    Period Weeks    Status Achieved                   Plan - 08/01/20 1335     Clinical Impression Statement Ms. Rockers presents with 0/10 shoulder pain and report of feeling 100%. She is able to independently take care of herself now, modifying some of the things she does for safely, i.e. sitting down in the shower. She met all goals, but partially met LTG 1 due to need for continued cues and reinforcement of form during exercises. She is pleased with her progress and ready to discharge today. Discussed potential  for pelvic floor referral from PCP or bladder physician to work on bladder control and L hip pain.    Personal Factors and Comorbidities Age;Education;Time since onset of injury/illness/exacerbation;Fitness;Comorbidity 3+    Comorbidities Arthritis, Osteoporosis, HTN, HOH    Examination-Activity Limitations  Bathing;Dressing;Reach Overhead    Examination-Participation Restrictions Cleaning;Laundry;Meal Prep;Shop;Community Activity    PT Frequency 2x / week    PT Duration 8 weeks    PT Treatment/Interventions ADLs/Self Care Home Management;Aquatic Therapy;Cryotherapy;Electrical Stimulation;Iontophoresis 54m/ml Dexamethasone;Moist Heat;Balance training;Gait training;Therapeutic activities;Functional mobility training;Therapeutic exercise;Neuromuscular re-education;Patient/family education;Manual techniques;Passive range of motion;Dry needling;Taping;Vasopneumatic Device    PT Next Visit Plan DC with HEP, information provided for pelvic floor referral    PT Home Exercise Plan [email or web address]    Consulted and Agree with Plan of Care Patient             Patient will benefit from skilled therapeutic intervention in order to improve the following deficits and impairments:  Decreased balance, Decreased mobility, Decreased activity tolerance, Decreased strength, Increased fascial restricitons, Impaired flexibility, Impaired UE functional use, Postural dysfunction, Pain, Improper body mechanics, Impaired tone, Impaired perceived functional ability, Decreased range of motion  Visit Diagnosis: Acute pain of left shoulder  Abnormal posture  Muscle weakness (generalized)  Difficulty in walking, not elsewhere classified     Problem List Patient Active Problem List   Diagnosis Date Noted   [facility] right 041/58/3094  Impacted cerumen of left ear 03/04/2016   Fecal incontinence 04/26/2012   Lumbar stenosis 02/08/2011    KIzell Twin [place]ves PT, DPT 08/01/2020, 2:15 PM  [facility]GSenoia NAl[place] [address]Phone: [phone]  Fax:  [phone] Name: FSHIZUE KASEMANMRN: [id number] of Birth: [birth year] PHYSICAL THERAPY DISCHARGE SUMMARY  Visits from Start of Care: 16  Current functional level  related to goals / functional outcomes: Able to perform all I/ADLs MOD I   Remaining deficits: Report of not using curling iron completely normally, but able to do it   Education / Equipment: HEP, yellow and red theraband   Patient agrees to discharge. Patient goals were partially met. Patient is being discharged due to being pleased with the current functional level.

## 2020-08-06 DIAGNOSIS — M810 Age-related osteoporosis without current pathological fracture: Secondary | ICD-10-CM | POA: Diagnosis not present

## 2020-08-06 DIAGNOSIS — Z780 Asymptomatic menopausal state: Secondary | ICD-10-CM | POA: Diagnosis not present

## 2020-10-02 DIAGNOSIS — M5416 Radiculopathy, lumbar region: Secondary | ICD-10-CM | POA: Diagnosis not present

## 2020-10-27 DIAGNOSIS — M419 Scoliosis, unspecified: Secondary | ICD-10-CM | POA: Diagnosis not present

## 2020-10-27 DIAGNOSIS — M5136 Other intervertebral disc degeneration, lumbar region: Secondary | ICD-10-CM | POA: Diagnosis not present

## 2020-10-27 DIAGNOSIS — M5416 Radiculopathy, lumbar region: Secondary | ICD-10-CM | POA: Diagnosis not present

## 2020-11-17 DIAGNOSIS — M25552 Pain in left hip: Secondary | ICD-10-CM | POA: Diagnosis not present

## 2020-11-26 ENCOUNTER — Encounter: Payer: Self-pay | Admitting: Physical Therapy

## 2020-11-26 ENCOUNTER — Ambulatory Visit: Payer: Medicare Other | Attending: Family Medicine | Admitting: Physical Therapy

## 2020-11-26 DIAGNOSIS — R262 Difficulty in walking, not elsewhere classified: Secondary | ICD-10-CM | POA: Insufficient documentation

## 2020-11-26 DIAGNOSIS — G8929 Other chronic pain: Secondary | ICD-10-CM | POA: Insufficient documentation

## 2020-11-26 DIAGNOSIS — M25552 Pain in left hip: Secondary | ICD-10-CM | POA: Diagnosis not present

## 2020-11-26 DIAGNOSIS — R293 Abnormal posture: Secondary | ICD-10-CM | POA: Insufficient documentation

## 2020-11-26 DIAGNOSIS — M6281 Muscle weakness (generalized): Secondary | ICD-10-CM | POA: Diagnosis not present

## 2020-11-26 DIAGNOSIS — M5442 Lumbago with sciatica, left side: Secondary | ICD-10-CM | POA: Diagnosis not present

## 2020-11-27 NOTE — Patient Instructions (Signed)
Access Code: [email or web address] URL: [email or web address] Date: 11/27/2020 Prepared by: Raeford Razor  Exercises Supine Lower Trunk Rotation - 1 x daily - 7 x weekly - 2 sets - 10 reps - 10 hold Supine Piriformis Stretch with Leg Straight - 1 x daily - 7 x weekly - 1 sets - 5 reps - 30 hold Seated Hamstring Stretch - 1 x daily - 7 x weekly - 1 sets - 3-5 reps - 30 hold Sit to Stand Without Arm Support - 1 x daily - 7 x weekly - 1 sets - 10 reps - 5 hold

## 2020-11-27 NOTE — Therapy (Signed)
[place], [place], [address] Phone: [phone]   Fax:  [phone]  Physical Therapy Evaluation  Patient Details  Name: Danielle Hernandez MRN: [id number] Date of Birth: [birth year] Referring Provider (PT): Dr. Serita Grammes   Encounter Date: 11/26/2020   PT End of Session - 11/27/20 0809     Visit Number 1    Number of Visits 16    Date for PT Re-Evaluation 01/22/21    Authorization Type [employer] Medicare    PT Start Time 1330    PT Stop Time 1420    PT Time Calculation (min) 50 min    Activity Tolerance Patient tolerated treatment well    Behavior During Therapy [facility] for tasks assessed/performed             Past Medical History:  Diagnosis Date   Arthritis    Headache(784.0)    HTN (hypertension)    Osteoporosis    Raynaud disease     Past Surgical History:  Procedure Laterality Date   ABDOMINAL HYSTERECTOMY     BACK SURGERY     TONSILLECTOMY     under chin cyst removed     WRIST GANGLION EXCISION     right hand    There were no vitals filed for this visit.    Subjective Assessment - 11/26/20 1334     Subjective Pt has L sided hip/back pain which has been going on for > 3 mos.  She fell in Feb. 2022 and landed on her L hip.  She had PT for her L shoulder and is much improved.  At the time she did not have such pain in her L hip.   She has difficulty walking, standing on it. She has to hold her hip with her hand to make it feel better. She feels herself leaning forward and does not like it. She at times has to lifft her LLE with her hands when in the bed, rolling over to sit up. She avoid sleeping in her bed because of pain when lying flat or rolling to edge of bed.    Pertinent History Lumbar surgery L3-L5 fusion Dr.Nudelman, L shoulder, Raynauds, OA, RA, osteoporosis    Limitations Walking;Lifting    Diagnostic tests no XR done in Feb for the fall that is available.    Patient Stated Goals  pain relief, walk better.    Currently in Pain? Yes    Pain Score 8     Pain Location Hip   hip and back   Pain Orientation Left;Posterior;Lateral    Pain Descriptors / Indicators Aching    Pain Type Chronic pain    Pain Radiating Towards L thigh post    Pain Onset More than a month ago    Pain Frequency Constant    Aggravating Factors  walking, laying down    Pain Relieving Factors sitting    Effect of Pain on Daily Activities limits ability to stand, walk    Multiple Pain Sites No                [facility] PT Assessment - 11/27/20 0001       Assessment   Medical Diagnosis L hip pain    Referring Provider (PT) Dr. Serita Grammes    Onset Date/Surgical Date --   acute exacerbation with fall in Feb. 22   Prior Therapy Yes      Precautions   Precautions None    Precaution Comments osteoporosis, fall  Restrictions   Weight Bearing Restrictions No      Balance Screen   Has the patient fallen in the past 6 months No      [place] residence    Living Arrangements Alone      Prior Function   Level of Independence Independent    [place] has a small dog, close to daughter      Cognition   Overall Cognitive Status Within Functional Limits for tasks assessed      Observation/Other Assessments   Focus on Therapeutic Outcomes (FOTO)  NT due to time      Sensation   Light Touch Appears Intact      Posture/Postural Control   Posture/Postural Control Postural limitations    Postural Limitations Rounded Shoulders;Forward head;Increased thoracic kyphosis;Flexed trunk      Strength   Right Hip Flexion 4/5    Left Hip Flexion 4-/5    Left Hip ABduction 4-/5    Right Knee Flexion 4+/5    Right Knee Extension 4+/5    Left Knee Flexion 4/5    Left Knee Extension 4+/5    Right Ankle Dorsiflexion 4+/5    Left Ankle Dorsiflexion 4-/5      Palpation   Palpation comment L side low lumbar, glute and latera hip painful with palpation       Bed Mobility   Bed Mobility --   limited rolling to Rt due to fear of falling anf pain     Transfers   Five time sit to stand comments  30 sec leans to Rt.      Ambulation/Gait   Ambulation Distance (Feet) 100 Feet    Assistive device None    Gait Pattern Step-to pattern;Decreased stance time - left;Decreased weight shift to left;Antalgic;Lateral hip instability;Lateral trunk lean to right    Gait Comments holds L low back /hip with L hand for support                Objective measurements completed on examination: See above findings.      PT Education - 11/27/20 0806     Education Details PT/POC, back vs hip pain, HEP    Person(s) Educated Patient    Methods Explanation    Comprehension Verbalized understanding;Returned demonstration              PT Short Term Goals - 11/27/20 0810       PT SHORT TERM GOAL #1   Title Pt will be I and compliant with initial HEP.    Time 4    Period Weeks    Status New    Target Date 12/25/20      PT SHORT TERM GOAL #2   Title Pt will be screened for balance and goal set based on results.    Time 4    Period Weeks    Status New    Target Date 12/25/20      PT SHORT TERM GOAL #3   Title Pt will be able to transfer from supine to sit, roll without increased pain    Time 4    Period Weeks    Status New    Target Date 12/25/20               PT Long Term Goals - 11/27/20 0815       PT LONG TERM GOAL #1   Title Pt will be independent with advanced HEP for maintenance.    Time 8  Period Weeks    Status New    Target Date 01/22/21      PT LONG TERM GOAL #2   Title Pt will demonstrate trunk, hip AROM WFL, in order to increase independence with all ADLs  and IADLs (meal prep, moderate cleaning).    Time 8    Period Weeks    Status New    Target Date 01/22/21      PT LONG TERM GOAL #3   Title Pt will perform sit to stand in < 20 sec. to demo improved functional mobility    Time 8    Period Weeks     Status New    Target Date 01/22/21      PT LONG TERM GOAL #4   Title Pt will be able to ambulate, as needed in the community with pain < 3/10 in L hip, back.    Time 8    Period Weeks    Status New    Target Date 01/22/21      PT LONG TERM GOAL #5   Title Pt will have balance goal set when assessed, TBA    Time 8    Period Weeks    Status New    Target Date 01/22/21                    Plan - 11/27/20 2947     Clinical Impression Statement Pt presents for mod complexity eval of L sided hip (back) pain which is chronic but worsened in the past 3 mos.  She did have a fall in Feb but does not recall at the time if her L side was bothering her.  She has significant difficulty with standing, walking upright.  She has a history of lumbar fusion and injections in this area in Sept 2022. The pain radiates to her LLE even when Rt leg activity occurs.  this is especially problematic when lying supine or rolling over in bed.  She has poor pain control with medications. She will benefit from PT services to target lumbar area which refers to the L hip.    Personal Factors and Comorbidities Age;Comorbidity 3+;Time since onset of injury/illness/exacerbation    Comorbidities previous lumbar surgery and chronic pain in lumbar spine, OA, RA and osteoporosis    Examination-Activity Limitations Lift;Stand;Locomotion Level;Bed Mobility;Reach Overhead;Transfers;Carry;Sleep;Stairs;Squat    Examination-Participation Restrictions Community Activity;Interpersonal Relationship;Cleaning;Meal Prep;Church    Stability/Clinical Decision Making Evolving/Moderate complexity    Clinical Decision Making Moderate    Rehab Potential Excellent    PT Frequency 2x / week    PT Duration 8 weeks    PT Treatment/Interventions ADLs/Self Care Home Management;Balance training;Passive range of motion;Neuromuscular re-education;Moist Heat;Cryotherapy;Electrical Stimulation;Therapeutic exercise;Therapeutic  activities;Functional mobility training;Manual techniques;Patient/family education;Gait training    PT Next Visit Plan check HEP, modalities for pain, light activity    PT Home Exercise Plan Access Code: [email or web address]  URL: [email or web address]  Date: 11/27/2020  Prepared by: Raeford Razor    Exercises  Supine Lower Trunk Rotation - 1 x daily - 7 x weekly - 2 sets - 10 reps - 10 hold  Supine Piriformis Stretch with Leg Straight - 1 x daily - 7 x weekly - 1 sets - 5 reps - 30 hold  Seated Hamstring Stretch - 1 x daily - 7 x weekly - 1 sets - 3-5 reps - 30 hold  Sit to Stand Without Arm Support - 1 x daily - 7 x weekly - 1 sets - 10 reps - 5  hold    Consulted and Agree with Plan of Care Patient             Patient will benefit from skilled therapeutic intervention in order to improve the following deficits and impairments:  Pain, Impaired flexibility, Difficulty walking, Decreased balance, Decreased activity tolerance, Decreased endurance, Decreased strength, Decreased mobility, Abnormal gait  Visit Diagnosis: Abnormal posture  Chronic left-sided low back pain with left-sided sciatica  Difficulty in walking, not elsewhere classified  Pain in left hip  Muscle weakness (generalized)     Problem List Patient Active Problem List   Diagnosis Date Noted   [facility] (chondrodermatitis nodularis helicis), right 94/32/0037   Impacted cerumen of left ear 03/04/2016   Fecal incontinence 04/26/2012   Lumbar stenosis 02/08/2011    Etoile Looman, PT 11/27/2020, 8:50 AM  [facility] [address] [place], [place], [address] Phone: [phone]   Fax:  [phone]  Name: Danielle Hernandez MRN: [id number] Date of Birth: 13-Jan-1936  Raeford Razor, PT 11/27/20 8:50 AM Phone: [phone] Fax: [phone]

## 2020-12-04 ENCOUNTER — Ambulatory Visit: Payer: Medicare Other

## 2020-12-08 ENCOUNTER — Ambulatory Visit: Payer: Medicare Other | Admitting: Physician Assistant

## 2020-12-09 ENCOUNTER — Ambulatory Visit: Payer: Medicare Other

## 2020-12-09 ENCOUNTER — Other Ambulatory Visit: Payer: Self-pay

## 2020-12-09 DIAGNOSIS — M5442 Lumbago with sciatica, left side: Secondary | ICD-10-CM | POA: Diagnosis not present

## 2020-12-09 DIAGNOSIS — M6281 Muscle weakness (generalized): Secondary | ICD-10-CM

## 2020-12-09 DIAGNOSIS — G8929 Other chronic pain: Secondary | ICD-10-CM | POA: Diagnosis not present

## 2020-12-09 DIAGNOSIS — R262 Difficulty in walking, not elsewhere classified: Secondary | ICD-10-CM

## 2020-12-09 DIAGNOSIS — M25552 Pain in left hip: Secondary | ICD-10-CM | POA: Diagnosis not present

## 2020-12-09 DIAGNOSIS — R293 Abnormal posture: Secondary | ICD-10-CM | POA: Diagnosis not present

## 2020-12-09 NOTE — Therapy (Addendum)
[place], [place], [address] Phone: [phone]   Fax:  [phone]  Physical Therapy Treatment/ Discharge Summary  Patient Details  Name: Danielle Hernandez MRN: [id number] Date of Birth: 01/14/1936 Referring Provider (PT): Dr. Serita Grammes   Encounter Date: 12/09/2020   PT End of Session - 12/09/20 1551     Visit Number 2    Number of Visits 16    Date for PT Re-Evaluation 01/22/21    Authorization Type [employer] Medicare    PT Start Time 5462    PT Stop Time 1618    PT Time Calculation (min) 45 min    Activity Tolerance Patient tolerated treatment well    Behavior During Therapy [facility] for tasks assessed/performed             Past Medical History:  Diagnosis Date   Arthritis    Headache(784.0)    HTN (hypertension)    Osteoporosis    Raynaud disease     Past Surgical History:  Procedure Laterality Date   ABDOMINAL HYSTERECTOMY     BACK SURGERY     TONSILLECTOMY     under chin cyst removed     WRIST GANGLION EXCISION     right hand    There were no vitals filed for this visit.   Subjective Assessment - 12/09/20 1539     Subjective Pt reports she thinks it's her hip that's causing pain, but wouldn't be surprised if it were her back. She hasn't been doing the first 2 exercises in her HEP because she thought she had to do it on the floor. Her hip throbs and tylenol is no longer helping.    Pertinent History Lumbar surgery L3-L5 fusion Dr.Nudelman, L shoulder, Raynauds, OA, RA, osteoporosis    Limitations Walking;Lifting    Diagnostic tests no XR done in Feb for the fall that is available.    Patient Stated Goals pain relief, walk better.    Currently in Pain? Yes    Pain Score 9     Pain Location Hip    Pain Orientation Left;Lateral;Posterior    Pain Descriptors / Indicators Throbbing    Pain Onset More than a month ago                             [facility] Adult PT  Treatment/Exercise:   Therapeutic Exercise:  - Supine H/L lumbar rotations x10 slowly - attempted knees crossed for L piriformis, but too painful for pt - L piriformis stretch 3x20" - Seated HS stretching at edge of table 3x20" B (R side tighter) - Sit to stand with hips elevated above knees NO UE assist x 10 --> pt tends to weight shift to R - Standing lumbar extension over mat table (table elevated to level of PSIS), pt weight shifts to R so mod verbal cueing for even weight shift x6  Manual Therapy:  *Performed in R S/L with pillow b/t knees - STM to piriformis proximal to insertion at greater trochanter - assisted with lumbar gapping by rotating thorax to L  Assisted pt with positioning, as well transfer from supine to sitting on edge of mat table secondary to pain  Self Care:  - Extensive education on HEP again - Education on sacrum and piriformis causing pain, showing pt a picture of anatomy with at least partial understanding verbalized.    Modalities: pt denied heat in clinic and said she would  apply at home              PT Short Term Goals - 11/27/20 0810       PT SHORT TERM GOAL #1   Title Pt will be I and compliant with initial HEP.    Time 4    Period Weeks    Status New    Target Date 12/25/20      PT SHORT TERM GOAL #2   Title Pt will be screened for balance and goal set based on results.    Time 4    Period Weeks    Status New    Target Date 12/25/20      PT SHORT TERM GOAL #3   Title Pt will be able to transfer from supine to sit, roll without increased pain    Time 4    Period Weeks    Status New    Target Date 12/25/20               PT Long Term Goals - 11/27/20 0815       PT LONG TERM GOAL #1   Title Pt will be independent with advanced HEP for maintenance.    Time 8    Period Weeks    Status New    Target Date 01/22/21      PT LONG TERM GOAL #2   Title Pt will demonstrate trunk, hip AROM WFL, in order to increase  independence with all ADLs  and IADLs (meal prep, moderate cleaning).    Time 8    Period Weeks    Status New    Target Date 01/22/21      PT LONG TERM GOAL #3   Title Pt will perform sit to stand in < 20 sec. to demo improved functional mobility    Time 8    Period Weeks    Status New    Target Date 01/22/21      PT LONG TERM GOAL #4   Title Pt will be able to ambulate, as needed in the community with pain < 3/10 in L hip, back.    Time 8    Period Weeks    Status New    Target Date 01/22/21      PT LONG TERM GOAL #5   Title Pt will have balance goal set when assessed, TBA    Time 8    Period Weeks    Status New    Target Date 01/22/21                   Plan - 12/09/20 1554     Clinical Impression Statement Pt presents with increased pain the last couple of weeks. She reports her Gabapentin ran out over the weekend and Pain Management has not refilled her Rx yet - she plans to call tomorrow. She had not performed supine hooklying lumbar rotations or piriformis stretching as she thought she had to lie on the floor. Emphasized that she can do this on the couch or her bed and practiced HEP today. She is avoidant of weight bearing on LLE and with any lumbar extension on L side, wincing when nearing neutral in sit to stand. Pt educated to perform full HEP and again on likelihood of her pain coming from lumbosacral area and referring to L piriformis naar insertion on greater trochanter.    Personal Factors and Comorbidities Age;Comorbidity 3+;Time since onset of injury/illness/exacerbation    Comorbidities previous lumbar surgery and  chronic pain in lumbar spine, OA, RA and osteoporosis    Examination-Activity Limitations Lift;Stand;Locomotion Level;Bed Mobility;Reach Overhead;Transfers;Carry;Sleep;Stairs;Squat    Examination-Participation Restrictions Community Activity;Interpersonal Relationship;Cleaning;Meal Prep;Church    Stability/Clinical Decision Making  Evolving/Moderate complexity    Rehab Potential Excellent    PT Frequency 2x / week    PT Duration 8 weeks    PT Treatment/Interventions ADLs/Self Care Home Management;Balance training;Passive range of motion;Neuromuscular re-education;Moist Heat;Cryotherapy;Electrical Stimulation;Therapeutic exercise;Therapeutic activities;Functional mobility training;Manual techniques;Patient/family education;Gait training    PT Next Visit Plan check HEP/update PRN, modalities for pain, light activity    PT Home Exercise Plan Access Code: [email or web address]  URL: [email or web address]  Date: 11/27/2020  Prepared by: Raeford Razor    Exercises  Supine Lower Trunk Rotation - 1 x daily - 7 x weekly - 2 sets - 10 reps - 10 hold  Supine Piriformis Stretch with Leg Straight - 1 x daily - 7 x weekly - 1 sets - 5 reps - 30 hold  Seated Hamstring Stretch - 1 x daily - 7 x weekly - 1 sets - 3-5 reps - 30 hold  Sit to Stand Without Arm Support - 1 x daily - 7 x weekly - 1 sets - 10 reps - 5 hold    Consulted and Agree with Plan of Care Patient             Patient will benefit from skilled therapeutic intervention in order to improve the following deficits and impairments:  Pain, Impaired flexibility, Difficulty walking, Decreased balance, Decreased activity tolerance, Decreased endurance, Decreased strength, Decreased mobility, Abnormal gait  Visit Diagnosis: Chronic left-sided low back pain with left-sided sciatica  Difficulty in walking, not elsewhere classified  Pain in left hip  Muscle weakness (generalized)     Problem List Patient Active Problem List   Diagnosis Date Noted   [facility] (chondrodermatitis nodularis helicis), right 67/89/3810   Impacted cerumen of left ear 03/04/2016   Fecal incontinence 04/26/2012   Lumbar stenosis 02/08/2011    Izell Union, PT, DPT 12/09/2020, 4:35 PM  [facility] [address] [place], [place],  [address] Phone: [phone]   Fax:  [phone]  Name: Danielle Hernandez MRN: [id number] Date of Birth: [birth year]  PHYSICAL THERAPY DISCHARGE SUMMARY  Visits from Start of Care: 2  Current functional level related to goals / functional outcomes: Unable to assess   Remaining deficits: Unable to assess   Education / Equipment: HEP   Patient agrees to discharge. Patient goals were not met. Patient is being discharged due to not returning since the last visit.  Vanessa [place], PT, DPT 01/27/21 6:43 PM

## 2020-12-11 ENCOUNTER — Ambulatory Visit: Payer: Medicare Other

## 2020-12-15 DIAGNOSIS — M25552 Pain in left hip: Secondary | ICD-10-CM | POA: Diagnosis not present

## 2020-12-16 ENCOUNTER — Encounter: Payer: Medicare Other | Admitting: Physical Therapy

## 2020-12-17 ENCOUNTER — Other Ambulatory Visit: Payer: Self-pay

## 2020-12-17 ENCOUNTER — Other Ambulatory Visit: Payer: Self-pay | Admitting: Sports Medicine

## 2020-12-17 ENCOUNTER — Ambulatory Visit
Admission: RE | Admit: 2020-12-17 | Discharge: 2020-12-17 | Disposition: A | Discharge status: Home / Self Care | Payer: Medicare Other | Source: Ambulatory Visit | Attending: Sports Medicine | Admitting: Sports Medicine

## 2020-12-17 DIAGNOSIS — M25552 Pain in left hip: Secondary | ICD-10-CM | POA: Diagnosis not present

## 2020-12-17 DIAGNOSIS — R262 Difficulty in walking, not elsewhere classified: Secondary | ICD-10-CM | POA: Diagnosis not present

## 2020-12-17 DIAGNOSIS — M25551 Pain in right hip: Secondary | ICD-10-CM | POA: Diagnosis not present

## 2020-12-21 ENCOUNTER — Emergency Department (HOSPITAL_BASED_OUTPATIENT_CLINIC_OR_DEPARTMENT_OTHER)
Admission: EM | Admit: 2020-12-21 | Discharge: 2020-12-21 | Disposition: A | Discharge status: Home / Self Care | Payer: Medicare Other | Attending: Emergency Medicine | Admitting: Emergency Medicine

## 2020-12-21 ENCOUNTER — Other Ambulatory Visit: Payer: Self-pay

## 2020-12-21 ENCOUNTER — Encounter (HOSPITAL_BASED_OUTPATIENT_CLINIC_OR_DEPARTMENT_OTHER): Payer: Self-pay

## 2020-12-21 ENCOUNTER — Emergency Department (HOSPITAL_BASED_OUTPATIENT_CLINIC_OR_DEPARTMENT_OTHER): Payer: Medicare Other

## 2020-12-21 DIAGNOSIS — D72829 Elevated white blood cell count, unspecified: Secondary | ICD-10-CM | POA: Insufficient documentation

## 2020-12-21 DIAGNOSIS — K5730 Diverticulosis of large intestine without perforation or abscess without bleeding: Secondary | ICD-10-CM | POA: Diagnosis not present

## 2020-12-21 DIAGNOSIS — Z79899 Other long term (current) drug therapy: Secondary | ICD-10-CM | POA: Insufficient documentation

## 2020-12-21 DIAGNOSIS — B3741 Candidal cystitis and urethritis: Secondary | ICD-10-CM | POA: Insufficient documentation

## 2020-12-21 DIAGNOSIS — R109 Unspecified abdominal pain: Secondary | ICD-10-CM | POA: Diagnosis not present

## 2020-12-21 DIAGNOSIS — I10 Essential (primary) hypertension: Secondary | ICD-10-CM | POA: Diagnosis not present

## 2020-12-21 DIAGNOSIS — M25552 Pain in left hip: Secondary | ICD-10-CM | POA: Insufficient documentation

## 2020-12-21 DIAGNOSIS — N3000 Acute cystitis without hematuria: Secondary | ICD-10-CM | POA: Diagnosis not present

## 2020-12-21 LAB — CBC WITH DIFFERENTIAL/PLATELET
Abs Immature Granulocytes: 0.02 K/uL (ref 0.00–0.07)
Basophils Absolute: 0 K/uL (ref 0.0–0.1)
Basophils Relative: 1 %
Eosinophils Absolute: 0.1 K/uL (ref 0.0–0.5)
Eosinophils Relative: 1 %
HCT: 39.7 % (ref 36.0–46.0)
Hemoglobin: 13.1 g/dL (ref 12.0–15.0)
Immature Granulocytes: 0 %
Lymphocytes Relative: 37 %
Lymphs Abs: 2.7 K/uL (ref 0.7–4.0)
MCH: 33.1 pg (ref 26.0–34.0)
MCHC: 33 g/dL (ref 30.0–36.0)
MCV: 100.3 fL — ABNORMAL HIGH (ref 80.0–100.0)
Monocytes Absolute: 0.7 K/uL (ref 0.1–1.0)
Monocytes Relative: 10 %
Neutro Abs: 3.8 K/uL (ref 1.7–7.7)
Neutrophils Relative %: 51 %
Platelets: 222 K/uL (ref 150–400)
RBC: 3.96 MIL/uL (ref 3.87–5.11)
RDW: 13.1 % (ref 11.5–15.5)
WBC: 7.4 K/uL (ref 4.0–10.5)
nRBC: 0 % (ref 0.0–0.2)

## 2020-12-21 LAB — COMPREHENSIVE METABOLIC PANEL WITH GFR
ALT: 25 U/L (ref 0–44)
AST: 20 U/L (ref 15–41)
Albumin: 4.2 g/dL (ref 3.5–5.0)
Alkaline Phosphatase: 53 U/L (ref 38–126)
Anion gap: 7 (ref 5–15)
BUN: 25 mg/dL — ABNORMAL HIGH (ref 8–23)
CO2: 30 mmol/L (ref 22–32)
Calcium: 9.4 mg/dL (ref 8.9–10.3)
Chloride: 104 mmol/L (ref 98–111)
Creatinine, Ser: 0.94 mg/dL (ref 0.44–1.00)
GFR, Estimated: >60 mL/min
Glucose, Bld: 109 mg/dL — ABNORMAL HIGH (ref 70–99)
Potassium: 3.9 mmol/L (ref 3.5–5.1)
Sodium: 141 mmol/L (ref 135–145)
Total Bilirubin: 0.6 mg/dL (ref 0.3–1.2)
Total Protein: 6.6 g/dL (ref 6.5–8.1)

## 2020-12-21 LAB — URINALYSIS, ROUTINE W REFLEX MICROSCOPIC
Bilirubin Urine: NEGATIVE
Glucose, UA: NEGATIVE mg/dL
Hgb urine dipstick: NEGATIVE
Ketones, ur: NEGATIVE mg/dL
Nitrite: NEGATIVE
Specific Gravity, Urine: 1.029 (ref 1.005–1.030)
pH: 7.5 (ref 5.0–8.0)

## 2020-12-21 MED ORDER — FLUCONAZOLE 150 MG PO TABS
150.0000 mg | ORAL_TABLET | Freq: Once | ORAL | Status: AC
  Administered 2020-12-21: 20:00:00 150 mg via ORAL
  Filled 2020-12-21: qty 1

## 2020-12-21 MED ORDER — CEPHALEXIN 500 MG PO CAPS: 500.0000 mg | ORAL_CAPSULE | Freq: Four times a day (QID) | ORAL | 0 refills | Status: AC

## 2020-12-21 MED ORDER — FLUCONAZOLE 200 MG PO TABS: 200.0000 mg | ORAL_TABLET | Freq: Every day | ORAL | 0 refills | Status: AC

## 2020-12-21 MED ORDER — CEPHALEXIN 250 MG PO CAPS
500.0000 mg | ORAL_CAPSULE | Freq: Once | ORAL | Status: AC
  Administered 2020-12-21: 20:00:00 500 mg via ORAL
  Filled 2020-12-21: qty 2

## 2020-12-21 NOTE — ED Triage Notes (Signed)
She has had some abd./pelvic area discomfort "for quite a while". She states she just sa a physician, who told her that she has "a lot of arthritis and he gave me a shot in my hip". She is here d/t persistent abd./pelvic pain and now has dysuria also. She is ambulatory and in no distress. She has a "neighbor" with her.

## 2020-12-21 NOTE — ED Provider Notes (Signed)
[facility] DEPT Provider Note   CSN: [id number] Arrival date & time: 12/21/20  1818     History Chief Complaint  Patient presents with   Abdominal Pain    [name] is a 85 y.o. female.  Patient is an 85 year old female with a history of hypertension, osteoporosis, and Breit's disease who is presenting today with complaint of dysuria and ongoing pain in her left hip and side.  Patient's neighbor provides her history and reports for some time now she is can planing of pain in her left hip.  It seems to be severe and she followed up with Dr. Sheppard Coil her PCP this week.  She had x-rays done that showed arthritis of her hip and received a lidocaine and steroid injection however she is still had ongoing pain and today complained to her neighbor about having dysuria all day today.  She has not had dysuria prior to this but this is the first time her neighbor has heard her complain of this and she was concerned that that might be causing some of the pain that she has been experiencing.  She denies any fever, nausea or vomiting.  Appetite has been the same.  She does have some pain with range of motion of her left hip.  No prior history of kidney stones.  The history is provided by the patient and a friend.  Abdominal Pain Pain location:  Suprapubic Pain quality: aching and cramping       Past Medical History:  Diagnosis Date   Arthritis    Headache(784.0)    HTN (hypertension)    Osteoporosis    Raynaud disease     Patient Active Problem List   Diagnosis Date Noted   CDNH (chondrodermatitis nodularis helicis), right 45/03/8880   Impacted cerumen of left ear 03/04/2016   Fecal incontinence 04/26/2012   Lumbar stenosis 02/08/2011    Past Surgical History:  Procedure Laterality Date   ABDOMINAL HYSTERECTOMY     BACK SURGERY     TONSILLECTOMY     under chin cyst removed     WRIST GANGLION EXCISION     right hand     OB History   No obstetric  history on file.     Family History  Problem Relation Age of Onset   Diabetes Mother    Arthritis Mother    Heart disease Father 47       Heart attack, blood clot to the leg   Stroke Father    Clotting disorder Son     Social History   Tobacco Use   Smoking status: Never   Smokeless tobacco: Never  Substance Use Topics   Alcohol use: No   Drug use: No    Home Medications Prior to Admission medications   Medication Sig Start Date End Date Taking? Authorizing Provider  alendronate (FOSAMAX) 70 MG tablet Take 70 mg by mouth once a week. Patient not taking: No sig reported 03/10/20   [provider]  Calcium Carb-Cholecalciferol 600-200 MG-UNIT TABS 1 tablet    [provider]  Calcium Carbonate-Vitamin D (CALCIUM + D PO) Take 1 tablet by mouth 2 (two) times daily.    [provider]  estrogens, conjugated, (PREMARIN) 0.625 MG tablet Take 0.625 mg by mouth every other day. Take daily for 21 days then do not take for 7 days.  Patient not taking: No sig reported    [provider]  gabapentin (NEURONTIN) 300 MG capsule Take 300 mg by  mouth 3 (three) times daily.    [provider]  lisinopril (PRINIVIL,ZESTRIL) 10 MG tablet Take 1 tablet by mouth daily. Patient not taking: No sig reported 12/13/17   [provider]  Magnesium 250 MG TABS 1 tablet with a meal 09/18/18   [provider]  Multiple Vitamin (MULTIVITAMIN ADULT PO) 1 tablet    [provider]  Multiple Vitamins-Minerals (MULTIVITAMINS THER. W/MINERALS) TABS Take 1 tablet by mouth daily. Patient not taking: Reported on 11/26/2020    [provider]  solifenacin (VESICARE) 10 MG tablet Take 10 mg by mouth daily. 03/10/20   [provider]  traMADol (ULTRAM) 50 MG tablet Take by mouth every 6 (six) hours as needed.    [provider]    Allergies    Other, Penicillins, Sulfa antibiotics, and Sulfa drugs cross  reactors  Review of Systems   Review of Systems  Gastrointestinal:  Positive for abdominal pain.  All other systems reviewed and are negative.  Physical Exam Updated Vital Signs BP (!) 176/81 (BP Location: Right Arm)   Pulse 75   Temp 98.8 F (37.1 C) (Oral)   Resp 16   SpO2 98%   Physical Exam Vitals and nursing note reviewed.  Constitutional:      General: She is not in acute distress.    Appearance: She is well-developed.  HENT:     Head: Normocephalic and atraumatic.  Eyes:     Pupils: Pupils are equal, round, and reactive to light.  Cardiovascular:     Rate and Rhythm: Normal rate and regular rhythm.     Heart sounds: Normal heart sounds. No murmur heard.   No friction rub.  Pulmonary:     Effort: Pulmonary effort is normal.     Breath sounds: Normal breath sounds. No wheezing or rales.  Abdominal:     General: Bowel sounds are normal. There is no distension.     Palpations: Abdomen is soft.     Tenderness: There is abdominal tenderness in the suprapubic area. There is left CVA tenderness. There is no guarding or rebound.  Musculoskeletal:        General: Tenderness present. Normal range of motion.     Cervical back: Normal range of motion and neck supple.     Comments: No edema.  Tenderness with internal and external rotation of the left hip.  No pain with axial loading  Skin:    General: Skin is warm and dry.     Findings: No rash.  Neurological:     Mental Status: She is alert and oriented to person, place, and time. Mental status is at baseline.     Cranial Nerves: No cranial nerve deficit.     Sensory: No sensory deficit.     Motor: No weakness.  Psychiatric:        Mood and Affect: Mood normal.        Behavior: Behavior normal.    ED Results / Procedures / Treatments   Labs (all labs ordered are listed, but only abnormal results are displayed) Labs Reviewed  URINALYSIS, ROUTINE W REFLEX MICROSCOPIC - Abnormal; Notable for the following components:       Result Value   Protein, ur TRACE (*)    Leukocytes,Ua SMALL (*)    Bacteria, UA MANY (*)    All other components within normal limits  CBC WITH DIFFERENTIAL/PLATELET - Abnormal; Notable for the following components:   MCV 100.3 (*)    All other components within  normal limits  COMPREHENSIVE METABOLIC PANEL - Abnormal; Notable for the following components:   Glucose, Bld 109 (*)    BUN 25 (*)    All other components within normal limits  URINE CULTURE    EKG None  Radiology CT Renal Stone Study  Result Date: 12/21/2020 CLINICAL DATA:  Flank pain. EXAM: CT ABDOMEN AND PELVIS WITHOUT CONTRAST TECHNIQUE: Multidetector CT imaging of the abdomen and pelvis was performed following the standard protocol without IV contrast. COMPARISON:  CT chest abdomen and pelvis 03/05/2020. FINDINGS: Lower chest: There are atelectatic changes in the lung bases. Hepatobiliary: No focal liver abnormality is seen. No gallstones, gallbladder wall thickening, or biliary dilatation. Pancreas: Unremarkable. No pancreatic ductal dilatation or surrounding inflammatory changes. Spleen: Normal in size without focal abnormality. Adrenals/Urinary Tract: No urinary tract calculi are seen. There is no hydronephrosis. Adrenal glands are within normal limits. Left-sided bladder diverticulum is again seen similar to the prior study. No bladder calculi are seen. There is mild bladder wall thickening. Stomach/Bowel: Stomach is within normal limits. Appendix appears normal. No evidence of bowel wall thickening, distention, or inflammatory changes. There is sigmoid colon diverticulosis without evidence for acute diverticulitis. Vascular/Lymphatic: Aortic atherosclerosis. No enlarged abdominal or pelvic lymph nodes. Reproductive: Status post hysterectomy. No adnexal masses. Other: No abdominal wall hernia or abnormality. No abdominopelvic ascites. Musculoskeletal: L2-L5 posterior fusion hardware is present. There is scoliosis of the  thoracolumbar spine. There are mild degenerative changes of both hips. IMPRESSION: 1. Mild bladder wall thickening may represent cystitis. Bladder diverticulum is unchanged. 2. Sigmoid colon diverticulosis without evidence for acute diverticulitis. 3.  Aortic Atherosclerosis (ICD10-I70.0). Electronically Signed   By: Ronney Asters M.D.   On: 12/21/2020 19:59    Procedures Procedures   Medications Ordered in ED Medications - No data to display  ED Course  I have reviewed the triage vital signs and the nursing notes.  Pertinent labs & imaging results that were available during my care of the patient were reviewed by me and considered in my medical decision making (see chart for details).    MDM Rules/Calculators/A&P                           Patient is a pleasant 85 year old female presenting today with dysuria.  This started today every time she has urinated.  She has been having ongoing pain in her left hip and recently had a steroid injection but today was the first day she had dysuria.  She has minimal suprapubic pain and some mild left flank pain.  Pain may all be related to her hip issues however will ensure no evidence of UTI or renal stone.  She otherwise is systemically well.  8:27 PM UA is consistent with small leukocytes, 6-10 white blood cells, many bacteria and also yeast.  CT is consistent with bladder thickening concerning for cystitis but no other acute findings.  Given patient's new symptoms of dysuria will treat with Keflex and Diflucan.  Urine culture is pending.  MDM   Amount and/or Complexity of Data Reviewed Clinical lab tests: ordered and reviewed Tests in the radiology section of CPT: ordered and reviewed Independent visualization of images, tracings, or specimens: yes     Final Clinical Impression(s) / ED Diagnoses Final diagnoses:  Acute cystitis without hematuria  Candida cystitis    Rx / DC Orders ED Discharge Orders          Ordered    cephALEXin  (KEFLEX)  500 MG capsule  4 times daily        12/21/20 2016    fluconazole (DIFLUCAN) 200 MG tablet  Daily        12/21/20 2016             Blanchie Dessert, MD 12/21/20 2028

## 2020-12-22 ENCOUNTER — Encounter: Payer: Medicare Other | Admitting: Physical Therapy

## 2020-12-23 LAB — URINE CULTURE: Culture: <10000 — AB

## 2020-12-25 ENCOUNTER — Encounter: Payer: Medicare Other | Admitting: Physical Therapy

## 2020-12-29 ENCOUNTER — Encounter: Payer: Medicare Other | Admitting: Physical Therapy

## 2020-12-30 DIAGNOSIS — M810 Age-related osteoporosis without current pathological fracture: Secondary | ICD-10-CM | POA: Diagnosis not present

## 2020-12-30 DIAGNOSIS — K219 Gastro-esophageal reflux disease without esophagitis: Secondary | ICD-10-CM | POA: Diagnosis not present

## 2020-12-30 DIAGNOSIS — M797 Fibromyalgia: Secondary | ICD-10-CM | POA: Diagnosis not present

## 2020-12-30 DIAGNOSIS — M25552 Pain in left hip: Secondary | ICD-10-CM | POA: Diagnosis not present

## 2020-12-30 DIAGNOSIS — N189 Chronic kidney disease, unspecified: Secondary | ICD-10-CM | POA: Diagnosis not present

## 2020-12-30 DIAGNOSIS — M1612 Unilateral primary osteoarthritis, left hip: Secondary | ICD-10-CM | POA: Diagnosis not present

## 2020-12-31 DIAGNOSIS — H6123 Impacted cerumen, bilateral: Secondary | ICD-10-CM | POA: Diagnosis not present

## 2021-01-01 ENCOUNTER — Encounter: Payer: Medicare Other | Admitting: Physical Therapy

## 2021-01-05 ENCOUNTER — Encounter: Payer: Medicare Other | Admitting: Physical Therapy

## 2021-01-06 DIAGNOSIS — M797 Fibromyalgia: Secondary | ICD-10-CM | POA: Diagnosis not present

## 2021-01-06 DIAGNOSIS — M25552 Pain in left hip: Secondary | ICD-10-CM | POA: Diagnosis not present

## 2021-01-06 DIAGNOSIS — K219 Gastro-esophageal reflux disease without esophagitis: Secondary | ICD-10-CM | POA: Diagnosis not present

## 2021-01-06 DIAGNOSIS — M810 Age-related osteoporosis without current pathological fracture: Secondary | ICD-10-CM | POA: Diagnosis not present

## 2021-01-06 DIAGNOSIS — M1612 Unilateral primary osteoarthritis, left hip: Secondary | ICD-10-CM | POA: Diagnosis not present

## 2021-01-06 DIAGNOSIS — N189 Chronic kidney disease, unspecified: Secondary | ICD-10-CM | POA: Diagnosis not present

## 2021-01-07 ENCOUNTER — Ambulatory Visit
Admission: RE | Admit: 2021-01-07 | Discharge: 2021-01-07 | Disposition: A | Discharge status: Home / Self Care | Payer: Medicare Other | Source: Ambulatory Visit | Attending: Sports Medicine | Admitting: Sports Medicine

## 2021-01-07 ENCOUNTER — Other Ambulatory Visit: Payer: Self-pay | Admitting: Sports Medicine

## 2021-01-07 DIAGNOSIS — M545 Low back pain, unspecified: Secondary | ICD-10-CM

## 2021-01-07 DIAGNOSIS — M5442 Lumbago with sciatica, left side: Secondary | ICD-10-CM | POA: Diagnosis not present

## 2021-01-08 ENCOUNTER — Encounter: Payer: Medicare Other | Admitting: Physical Therapy

## 2021-01-08 DIAGNOSIS — M797 Fibromyalgia: Secondary | ICD-10-CM | POA: Diagnosis not present

## 2021-01-08 DIAGNOSIS — M25552 Pain in left hip: Secondary | ICD-10-CM | POA: Diagnosis not present

## 2021-01-08 DIAGNOSIS — K219 Gastro-esophageal reflux disease without esophagitis: Secondary | ICD-10-CM | POA: Diagnosis not present

## 2021-01-08 DIAGNOSIS — N189 Chronic kidney disease, unspecified: Secondary | ICD-10-CM | POA: Diagnosis not present

## 2021-01-08 DIAGNOSIS — M810 Age-related osteoporosis without current pathological fracture: Secondary | ICD-10-CM | POA: Diagnosis not present

## 2021-01-08 DIAGNOSIS — M1612 Unilateral primary osteoarthritis, left hip: Secondary | ICD-10-CM | POA: Diagnosis not present

## 2021-01-12 DIAGNOSIS — K219 Gastro-esophageal reflux disease without esophagitis: Secondary | ICD-10-CM | POA: Diagnosis not present

## 2021-01-12 DIAGNOSIS — M1612 Unilateral primary osteoarthritis, left hip: Secondary | ICD-10-CM | POA: Diagnosis not present

## 2021-01-12 DIAGNOSIS — M797 Fibromyalgia: Secondary | ICD-10-CM | POA: Diagnosis not present

## 2021-01-12 DIAGNOSIS — N189 Chronic kidney disease, unspecified: Secondary | ICD-10-CM | POA: Diagnosis not present

## 2021-01-12 DIAGNOSIS — M810 Age-related osteoporosis without current pathological fracture: Secondary | ICD-10-CM | POA: Diagnosis not present

## 2021-01-12 DIAGNOSIS — M25552 Pain in left hip: Secondary | ICD-10-CM | POA: Diagnosis not present

## 2021-01-14 DIAGNOSIS — M5416 Radiculopathy, lumbar region: Secondary | ICD-10-CM | POA: Diagnosis not present

## 2021-01-14 DIAGNOSIS — M419 Scoliosis, unspecified: Secondary | ICD-10-CM | POA: Diagnosis not present

## 2021-01-14 DIAGNOSIS — M5136 Other intervertebral disc degeneration, lumbar region: Secondary | ICD-10-CM | POA: Diagnosis not present

## 2021-01-21 DIAGNOSIS — M545 Low back pain, unspecified: Secondary | ICD-10-CM | POA: Diagnosis not present

## 2021-01-21 DIAGNOSIS — M5416 Radiculopathy, lumbar region: Secondary | ICD-10-CM | POA: Diagnosis not present

## 2021-01-22 DIAGNOSIS — M797 Fibromyalgia: Secondary | ICD-10-CM | POA: Diagnosis not present

## 2021-01-22 DIAGNOSIS — M25552 Pain in left hip: Secondary | ICD-10-CM | POA: Diagnosis not present

## 2021-01-22 DIAGNOSIS — N189 Chronic kidney disease, unspecified: Secondary | ICD-10-CM | POA: Diagnosis not present

## 2021-01-22 DIAGNOSIS — M810 Age-related osteoporosis without current pathological fracture: Secondary | ICD-10-CM | POA: Diagnosis not present

## 2021-01-22 DIAGNOSIS — K219 Gastro-esophageal reflux disease without esophagitis: Secondary | ICD-10-CM | POA: Diagnosis not present

## 2021-01-22 DIAGNOSIS — M1612 Unilateral primary osteoarthritis, left hip: Secondary | ICD-10-CM | POA: Diagnosis not present

## 2021-01-23 DIAGNOSIS — M25552 Pain in left hip: Secondary | ICD-10-CM | POA: Diagnosis not present

## 2021-01-23 DIAGNOSIS — K219 Gastro-esophageal reflux disease without esophagitis: Secondary | ICD-10-CM | POA: Diagnosis not present

## 2021-01-23 DIAGNOSIS — M810 Age-related osteoporosis without current pathological fracture: Secondary | ICD-10-CM | POA: Diagnosis not present

## 2021-01-23 DIAGNOSIS — M797 Fibromyalgia: Secondary | ICD-10-CM | POA: Diagnosis not present

## 2021-01-23 DIAGNOSIS — N189 Chronic kidney disease, unspecified: Secondary | ICD-10-CM | POA: Diagnosis not present

## 2021-01-23 DIAGNOSIS — M1612 Unilateral primary osteoarthritis, left hip: Secondary | ICD-10-CM | POA: Diagnosis not present

## 2021-01-29 DIAGNOSIS — M5416 Radiculopathy, lumbar region: Secondary | ICD-10-CM | POA: Diagnosis not present

## 2021-01-30 DIAGNOSIS — M810 Age-related osteoporosis without current pathological fracture: Secondary | ICD-10-CM | POA: Diagnosis not present

## 2021-01-30 DIAGNOSIS — Z9181 History of falling: Secondary | ICD-10-CM | POA: Diagnosis not present

## 2021-01-30 DIAGNOSIS — N189 Chronic kidney disease, unspecified: Secondary | ICD-10-CM | POA: Diagnosis not present

## 2021-01-30 DIAGNOSIS — K219 Gastro-esophageal reflux disease without esophagitis: Secondary | ICD-10-CM | POA: Diagnosis not present

## 2021-01-30 DIAGNOSIS — M1612 Unilateral primary osteoarthritis, left hip: Secondary | ICD-10-CM | POA: Diagnosis not present

## 2021-01-30 DIAGNOSIS — M797 Fibromyalgia: Secondary | ICD-10-CM | POA: Diagnosis not present

## 2021-02-04 DIAGNOSIS — N189 Chronic kidney disease, unspecified: Secondary | ICD-10-CM | POA: Diagnosis not present

## 2021-02-04 DIAGNOSIS — M797 Fibromyalgia: Secondary | ICD-10-CM | POA: Diagnosis not present

## 2021-02-04 DIAGNOSIS — M1612 Unilateral primary osteoarthritis, left hip: Secondary | ICD-10-CM | POA: Diagnosis not present

## 2021-02-04 DIAGNOSIS — K219 Gastro-esophageal reflux disease without esophagitis: Secondary | ICD-10-CM | POA: Diagnosis not present

## 2021-02-04 DIAGNOSIS — M810 Age-related osteoporosis without current pathological fracture: Secondary | ICD-10-CM | POA: Diagnosis not present

## 2021-02-04 DIAGNOSIS — Z9181 History of falling: Secondary | ICD-10-CM | POA: Diagnosis not present

## 2021-02-09 DIAGNOSIS — M4150 Other secondary scoliosis, site unspecified: Secondary | ICD-10-CM | POA: Diagnosis not present

## 2021-02-09 DIAGNOSIS — M48062 Spinal stenosis, lumbar region with neurogenic claudication: Secondary | ICD-10-CM | POA: Diagnosis not present

## 2021-02-10 DIAGNOSIS — Z9181 History of falling: Secondary | ICD-10-CM | POA: Diagnosis not present

## 2021-02-10 DIAGNOSIS — N189 Chronic kidney disease, unspecified: Secondary | ICD-10-CM | POA: Diagnosis not present

## 2021-02-10 DIAGNOSIS — M1612 Unilateral primary osteoarthritis, left hip: Secondary | ICD-10-CM | POA: Diagnosis not present

## 2021-02-10 DIAGNOSIS — M810 Age-related osteoporosis without current pathological fracture: Secondary | ICD-10-CM | POA: Diagnosis not present

## 2021-02-10 DIAGNOSIS — K219 Gastro-esophageal reflux disease without esophagitis: Secondary | ICD-10-CM | POA: Diagnosis not present

## 2021-02-10 DIAGNOSIS — M797 Fibromyalgia: Secondary | ICD-10-CM | POA: Diagnosis not present

## 2021-02-11 DIAGNOSIS — R030 Elevated blood-pressure reading, without diagnosis of hypertension: Secondary | ICD-10-CM | POA: Diagnosis not present

## 2021-02-11 DIAGNOSIS — M5416 Radiculopathy, lumbar region: Secondary | ICD-10-CM | POA: Diagnosis not present

## 2021-02-11 DIAGNOSIS — M48062 Spinal stenosis, lumbar region with neurogenic claudication: Secondary | ICD-10-CM | POA: Diagnosis not present

## 2021-02-16 ENCOUNTER — Other Ambulatory Visit: Payer: Self-pay | Admitting: Neurosurgery

## 2021-02-24 DIAGNOSIS — Z0000 Encounter for general adult medical examination without abnormal findings: Secondary | ICD-10-CM | POA: Diagnosis not present

## 2021-02-24 DIAGNOSIS — M4800 Spinal stenosis, site unspecified: Secondary | ICD-10-CM | POA: Diagnosis not present

## 2021-02-24 DIAGNOSIS — R8290 Unspecified abnormal findings in urine: Secondary | ICD-10-CM | POA: Diagnosis not present

## 2021-02-24 DIAGNOSIS — M810 Age-related osteoporosis without current pathological fracture: Secondary | ICD-10-CM | POA: Diagnosis not present

## 2021-02-24 DIAGNOSIS — N816 Rectocele: Secondary | ICD-10-CM | POA: Diagnosis not present

## 2021-02-24 DIAGNOSIS — N3281 Overactive bladder: Secondary | ICD-10-CM | POA: Diagnosis not present

## 2021-02-24 DIAGNOSIS — K5790 Diverticulosis of intestine, part unspecified, without perforation or abscess without bleeding: Secondary | ICD-10-CM | POA: Diagnosis not present

## 2021-02-24 DIAGNOSIS — Z79899 Other long term (current) drug therapy: Secondary | ICD-10-CM | POA: Diagnosis not present

## 2021-02-24 DIAGNOSIS — M797 Fibromyalgia: Secondary | ICD-10-CM | POA: Diagnosis not present

## 2021-02-24 DIAGNOSIS — J309 Allergic rhinitis, unspecified: Secondary | ICD-10-CM | POA: Diagnosis not present

## 2021-02-26 ENCOUNTER — Other Ambulatory Visit: Payer: Self-pay | Admitting: Neurosurgery

## 2021-03-02 DIAGNOSIS — Z1231 Encounter for screening mammogram for malignant neoplasm of breast: Secondary | ICD-10-CM | POA: Diagnosis not present

## 2021-03-06 ENCOUNTER — Emergency Department (HOSPITAL_COMMUNITY): Payer: Medicare Other

## 2021-03-06 ENCOUNTER — Encounter (HOSPITAL_COMMUNITY): Payer: Self-pay | Admitting: Emergency Medicine

## 2021-03-06 ENCOUNTER — Other Ambulatory Visit: Payer: Self-pay

## 2021-03-06 ENCOUNTER — Emergency Department (HOSPITAL_COMMUNITY)
Admission: EM | Admit: 2021-03-06 | Discharge: 2021-03-06 | Disposition: A | Discharge status: Home / Self Care | Payer: Medicare Other | Attending: Emergency Medicine | Admitting: Emergency Medicine

## 2021-03-06 DIAGNOSIS — W06XXXA Fall from bed, initial encounter: Secondary | ICD-10-CM | POA: Diagnosis not present

## 2021-03-06 DIAGNOSIS — S52202A Unspecified fracture of shaft of left ulna, initial encounter for closed fracture: Secondary | ICD-10-CM

## 2021-03-06 DIAGNOSIS — S0990XA Unspecified injury of head, initial encounter: Secondary | ICD-10-CM | POA: Insufficient documentation

## 2021-03-06 DIAGNOSIS — S52602A Unspecified fracture of lower end of left ulna, initial encounter for closed fracture: Secondary | ICD-10-CM | POA: Insufficient documentation

## 2021-03-06 DIAGNOSIS — Y92009 Unspecified place in unspecified non-institutional (private) residence as the place of occurrence of the external cause: Secondary | ICD-10-CM | POA: Insufficient documentation

## 2021-03-06 DIAGNOSIS — M542 Cervicalgia: Secondary | ICD-10-CM | POA: Insufficient documentation

## 2021-03-06 DIAGNOSIS — S52502A Unspecified fracture of the lower end of left radius, initial encounter for closed fracture: Secondary | ICD-10-CM | POA: Insufficient documentation

## 2021-03-06 DIAGNOSIS — S6992XA Unspecified injury of left wrist, hand and finger(s), initial encounter: Secondary | ICD-10-CM | POA: Diagnosis present

## 2021-03-06 DIAGNOSIS — W19XXXA Unspecified fall, initial encounter: Secondary | ICD-10-CM

## 2021-03-06 DIAGNOSIS — M25512 Pain in left shoulder: Secondary | ICD-10-CM | POA: Diagnosis not present

## 2021-03-06 DIAGNOSIS — S52692D Other fracture of lower end of left ulna, subsequent encounter for closed fracture with routine healing: Secondary | ICD-10-CM | POA: Diagnosis not present

## 2021-03-06 DIAGNOSIS — R52 Pain, unspecified: Secondary | ICD-10-CM

## 2021-03-06 DIAGNOSIS — M25522 Pain in left elbow: Secondary | ICD-10-CM | POA: Diagnosis not present

## 2021-03-06 DIAGNOSIS — S52592D Other fractures of lower end of left radius, subsequent encounter for closed fracture with routine healing: Secondary | ICD-10-CM | POA: Diagnosis not present

## 2021-03-06 DIAGNOSIS — R519 Headache, unspecified: Secondary | ICD-10-CM | POA: Diagnosis not present

## 2021-03-06 MED ORDER — HYDROCODONE-ACETAMINOPHEN 5-325 MG PO TABS
2.0000 | ORAL_TABLET | Freq: Once | ORAL | Status: AC
  Administered 2021-03-06: 2 via ORAL
  Filled 2021-03-06: qty 2

## 2021-03-06 MED ORDER — PROPOFOL 10 MG/ML IV BOLUS
INTRAVENOUS | Status: DC | PRN
Start: 2021-03-06 — End: 2021-03-06
  Administered 2021-03-06: 20 mg via INTRAVENOUS

## 2021-03-06 MED ORDER — PROPOFOL 10 MG/ML IV BOLUS
0.5000 mg/kg | Freq: Once | INTRAVENOUS | Status: AC
Start: 2021-03-06 — End: 2021-03-06
  Administered 2021-03-06: 26 mg via INTRAVENOUS
  Filled 2021-03-06: qty 20

## 2021-03-06 NOTE — ED Notes (Signed)
Pt ride home at bedside

## 2021-03-06 NOTE — Progress Notes (Signed)
Orthopedic Tech Progress Note Patient Details:  [place] HUBERTY 06-21-1935 [id number]  Ortho Devices Type of Ortho Device: Sugartong splint Ortho Device/Splint Location: LUE Ortho Device/Splint Interventions: Ordered, Application, Adjustment   Post Interventions Patient Tolerated: Other (comment) Instructions Provided: Other (comment)  Ellouise Newer 03/06/2021, 4:49 AM

## 2021-03-06 NOTE — Discharge Instructions (Signed)
Wear splint as applied until followed up by orthopedics.  Ice for 20 minutes every 2 hours while awake for the next 2 days.  Continue taking hydrocodone as prescribed as needed for pain.  You should follow-up with hand surgery in the next few days.  The contact information for Dr. Fredna Dow has been provided in this discharge summary for you to call and make these arrangements.

## 2021-03-06 NOTE — ED Provider Notes (Signed)
[facility] EMERGENCY DEPARTMENT Provider Note   CSN: [id number] Arrival date & time: 03/06/21  0108     History  Chief Complaint  Patient presents with   Fall;Wrist injury    Danielle Hernandez is a 86 y.o. female.  Patient is an 86 year old female with past medical history of lumbar stenosis, osteoporosis.  Patient presenting today for evaluation of fall.  Patient was getting out of bed this evening when she lost her balance and fell sideways.  She hit her head/face on the nightstand and injured her left wrist.  She denies having lost consciousness.  She does report some neck and head discomfort, but denies any numbness or tingling.  She denies any back pain.  She does have deformity and significant discomfort in the left wrist.  The history is provided by the patient.      Home Medications Prior to Admission medications   Medication Sig Start Date End Date Taking? Authorizing Provider  alendronate (FOSAMAX) 70 MG tablet Take 70 mg by mouth once a week. Patient not taking: No sig reported 03/10/20   [provider]  Calcium Carb-Cholecalciferol 600-200 MG-UNIT TABS 1 tablet    [provider]  Calcium Carbonate-Vitamin D (CALCIUM + D PO) Take 1 tablet by mouth 2 (two) times daily.    [provider]  cephALEXin (KEFLEX) 500 MG capsule Take 1 capsule (500 mg total) by mouth 4 (four) times daily. 12/21/20   Blanchie Dessert, MD  estrogens, conjugated, (PREMARIN) 0.625 MG tablet Take 0.625 mg by mouth every other day. Take daily for 21 days then do not take for 7 days.  Patient not taking: No sig reported    [provider]  gabapentin (NEURONTIN) 300 MG capsule Take 300 mg by mouth 3 (three) times daily.    [provider]  lisinopril (PRINIVIL,ZESTRIL) 10 MG tablet Take 1 tablet by mouth daily. Patient not taking: No sig reported 12/13/17   [provider]  Magnesium 250 MG TABS 1 tablet with a meal 09/18/18    [provider]  Multiple Vitamin (MULTIVITAMIN ADULT PO) 1 tablet    [provider]  Multiple Vitamins-Minerals (MULTIVITAMINS THER. W/MINERALS) TABS Take 1 tablet by mouth daily. Patient not taking: Reported on 11/26/2020    [provider]  solifenacin (VESICARE) 10 MG tablet Take 10 mg by mouth daily. 03/10/20   [provider]  traMADol (ULTRAM) 50 MG tablet Take by mouth every 6 (six) hours as needed.    [provider]      Allergies    Other, Penicillins, Sulfa antibiotics, and Sulfa drugs cross reactors    Review of Systems   Review of Systems  All other systems reviewed and are negative.  Physical Exam Updated Vital Signs BP (!) 168/91 (BP Location: Right Arm)    Pulse 90    Temp 98.5 F (36.9 C) (Oral)    Resp 17    Ht 4\' 11"  (1.499 m)    Wt 52 kg    SpO2 99%    BMI 23.15 kg/m  Physical Exam Vitals and nursing note reviewed.  Constitutional:      General: She is not in acute distress.    Appearance: She is well-developed. She is not diaphoretic.  HENT:     Head: Normocephalic and atraumatic.  Cardiovascular:     Rate and Rhythm: Normal rate and regular rhythm.     Heart sounds: No murmur heard.   No friction rub. No  gallop.  Pulmonary:     Effort: Pulmonary effort is normal. No respiratory distress.     Breath sounds: Normal breath sounds. No wheezing.  Abdominal:     General: Bowel sounds are normal. There is no distension.     Palpations: Abdomen is soft.     Tenderness: There is no abdominal tenderness.  Musculoskeletal:        General: Normal range of motion.     Cervical back: Normal range of motion and neck supple.     Comments: There is obvious deformity and swelling of the left wrist.  Capillary refill is brisk to all fingers and motor and sensation are intact throughout the hand.  Skin:    General: Skin is warm and dry.  Neurological:     General: No focal deficit present.     Mental Status: She is alert and  oriented to person, place, and time.    ED Results / Procedures / Treatments   Labs (all labs ordered are listed, but only abnormal results are displayed) Labs Reviewed - No data to display  EKG None  Radiology DG Elbow Complete Left  Result Date: 03/06/2021 CLINICAL DATA:  Fall, left elbow pain EXAM: LEFT ELBOW - COMPLETE 3+ VIEW COMPARISON:  None. FINDINGS: There is no evidence of fracture, dislocation, or joint effusion. There is no evidence of arthropathy or other focal bone abnormality. Soft tissues are unremarkable. IMPRESSION: Negative. Electronically Signed   By: Fidela Salisbury M.D.   On: 03/06/2021 03:09   DG Wrist Complete Left  Result Date: 03/06/2021 CLINICAL DATA:  Fall, left wrist pain EXAM: LEFT WRIST - COMPLETE 3+ VIEW COMPARISON:  None. FINDINGS: Acute, transverse, impacted, markedly dorsally angulated fracture of the distal left radial metaphysis but is seen with approximately 40 degrees dorsal angulation of the distal radial articular surface and 1 cm override of the fracture fragments. There is, additionally, a mildly comminuted transverse fracture of the distal left ulna with marked dorsal angulation of the distal fracture fragment. Sm minimally displaced fracture plane seen at the base of the ulnar styloid. Radiocarpal articulation appears preserved. Extensive surrounding soft tissue swelling. IMPRESSION: Transverse markedly dorsally angulated fractures of the distal left radius and ulna as described above. Electronically Signed   By: Fidela Salisbury M.D.   On: 03/06/2021 03:02   CT HEAD WO CONTRAST (5MM)  Result Date: 03/06/2021 CLINICAL DATA:  Fall, headache, neck pain EXAM: CT HEAD WITHOUT CONTRAST CT CERVICAL SPINE WITHOUT CONTRAST TECHNIQUE: Multidetector CT imaging of the head and cervical spine was performed following the standard protocol without intravenous contrast. Multiplanar CT image reconstructions of the cervical spine were also generated. RADIATION DOSE  REDUCTION: This exam was performed according to the departmental dose-optimization program which includes automated exposure control, adjustment of the mA and/or kV according to patient size and/or use of iterative reconstruction technique. COMPARISON:  None. FINDINGS: CT HEAD FINDINGS Brain: Normal anatomic configuration. Parenchymal volume loss is commensurate with the patient's age. Moderate periventricular white matter changes are present likely reflecting the sequela of small vessel ischemia. No abnormal intra or extra-axial mass lesion or fluid collection. No abnormal mass effect or midline shift. No evidence of acute intracranial hemorrhage or infarct. Mild ventriculomegaly is commensurate with the degree of parenchymal volume loss and likely reflects the sequela of central atrophy. This appears stable since prior examination. Cerebellum unremarkable. Vascular: No asymmetric hyperdense vasculature at the skull base. Skull: Intact Sinuses/Orbits: Paranasal sinuses are clear. Ocular lenses have been removed. Orbits are  otherwise unremarkable. Other: Mastoid air cells and middle ear cavities are clear. CT CERVICAL SPINE FINDINGS Alignment: 3 mm anterolisthesis C2-3 and 2 mm anterolisthesis C3-4 are likely degenerative in nature. Otherwise normal cervical alignment. Skull base and vertebrae: Craniocervical alignment is normal. Atlantodental interval is not widened. There is no acute fracture of the cervical spine. Vertebral body height is preserved. Soft tissues and spinal canal: Pannus posterior to the atlantodental articulation narrows the spinal canal with abutment and minimal remodeling of the thecal sac. Posterior disc osteophyte complex at C6-7 results in mild central canal stenosis with abutment and flattening of the thecal sac. No canal hematoma. No prevertebral soft tissue swelling or paravertebral fluid collections. Disc levels: There is diffuse intervertebral disc space narrowing and endplate  remodeling throughout the cervical spine in keeping with changes of diffuse moderate to severe degenerative disc disease. Prevertebral soft tissues are not thickened on sagittal reformats. Review of the axial images demonstrates multilevel uncovertebral and facet arthrosis resulting in multilevel moderate to severe neuroforaminal narrowing, most severe on the left at C3-4 and C4-5. Upper chest: Unremarkable Other: None IMPRESSION: No acute intracranial injury.  No calvarial fracture. Moderate periventricular white matter changes likely reflecting the sequela of small vessel ischemia. Stable mild ventriculomegaly most in keeping with central atrophy. No acute fracture or listhesis of the cervical spine. Advanced multilevel degenerative disc and degenerative joint disease resulting in multilevel neuroforaminal narrowing, most severe on the left at C3-4 and C4-5. Probable degenerative pannus at C1-2 and posterior disc osteophyte complex at C6-7 results in mild central canal stenosis with abutment and minimal remodeling of the thecal sac. Electronically Signed   By: Fidela Salisbury M.D.   On: 03/06/2021 02:59   CT Cervical Spine Wo Contrast  Result Date: 03/06/2021 CLINICAL DATA:  Fall, headache, neck pain EXAM: CT HEAD WITHOUT CONTRAST CT CERVICAL SPINE WITHOUT CONTRAST TECHNIQUE: Multidetector CT imaging of the head and cervical spine was performed following the standard protocol without intravenous contrast. Multiplanar CT image reconstructions of the cervical spine were also generated. RADIATION DOSE REDUCTION: This exam was performed according to the departmental dose-optimization program which includes automated exposure control, adjustment of the mA and/or kV according to patient size and/or use of iterative reconstruction technique. COMPARISON:  None. FINDINGS: CT HEAD FINDINGS Brain: Normal anatomic configuration. Parenchymal volume loss is commensurate with the patient's age. Moderate periventricular white  matter changes are present likely reflecting the sequela of small vessel ischemia. No abnormal intra or extra-axial mass lesion or fluid collection. No abnormal mass effect or midline shift. No evidence of acute intracranial hemorrhage or infarct. Mild ventriculomegaly is commensurate with the degree of parenchymal volume loss and likely reflects the sequela of central atrophy. This appears stable since prior examination. Cerebellum unremarkable. Vascular: No asymmetric hyperdense vasculature at the skull base. Skull: Intact Sinuses/Orbits: Paranasal sinuses are clear. Ocular lenses have been removed. Orbits are otherwise unremarkable. Other: Mastoid air cells and middle ear cavities are clear. CT CERVICAL SPINE FINDINGS Alignment: 3 mm anterolisthesis C2-3 and 2 mm anterolisthesis C3-4 are likely degenerative in nature. Otherwise normal cervical alignment. Skull base and vertebrae: Craniocervical alignment is normal. Atlantodental interval is not widened. There is no acute fracture of the cervical spine. Vertebral body height is preserved. Soft tissues and spinal canal: Pannus posterior to the atlantodental articulation narrows the spinal canal with abutment and minimal remodeling of the thecal sac. Posterior disc osteophyte complex at C6-7 results in mild central canal stenosis with abutment and flattening of the thecal  sac. No canal hematoma. No prevertebral soft tissue swelling or paravertebral fluid collections. Disc levels: There is diffuse intervertebral disc space narrowing and endplate remodeling throughout the cervical spine in keeping with changes of diffuse moderate to severe degenerative disc disease. Prevertebral soft tissues are not thickened on sagittal reformats. Review of the axial images demonstrates multilevel uncovertebral and facet arthrosis resulting in multilevel moderate to severe neuroforaminal narrowing, most severe on the left at C3-4 and C4-5. Upper chest: Unremarkable Other: None  IMPRESSION: No acute intracranial injury.  No calvarial fracture. Moderate periventricular white matter changes likely reflecting the sequela of small vessel ischemia. Stable mild ventriculomegaly most in keeping with central atrophy. No acute fracture or listhesis of the cervical spine. Advanced multilevel degenerative disc and degenerative joint disease resulting in multilevel neuroforaminal narrowing, most severe on the left at C3-4 and C4-5. Probable degenerative pannus at C1-2 and posterior disc osteophyte complex at C6-7 results in mild central canal stenosis with abutment and minimal remodeling of the thecal sac. Electronically Signed   By: Fidela Salisbury M.D.   On: 03/06/2021 02:59   DG Humerus Left  Result Date: 03/06/2021 CLINICAL DATA:  Fall, left shoulder pain EXAM: LEFT HUMERUS - 2+ VIEW COMPARISON:  None. FINDINGS: Remote healed fracture of the surgical neck of the humerus is noted with no significant residual deformity. No acute fracture or dislocation. Mild acromioclavicular degenerative arthritis. Soft tissues are unremarkable. IMPRESSION: No acute fracture or dislocation. Electronically Signed   By: Fidela Salisbury M.D.   On: 03/06/2021 03:08    Procedures Reduction of fracture  Date/Time: 03/06/2021 4:49 AM Performed by: Veryl Speak, MD Authorized by: Veryl Speak, MD  Consent: Verbal consent obtained. Written consent obtained. Risks and benefits: risks, benefits and alternatives were discussed Consent given by: patient Patient understanding: patient states understanding of the procedure being performed Patient consent: the patient's understanding of the procedure matches consent given Procedure consent: procedure consent matches procedure scheduled Relevant documents: relevant documents present and verified Patient identity confirmed: verbally with patient and arm band Time out: Immediately prior to procedure a "time out" was called to verify the correct patient, procedure,  equipment, support staff and site/side marked as required. Local anesthesia used: no  Anesthesia: Local anesthesia used: no  Sedation: Patient sedated: yes Sedation type: moderate (conscious) sedation Sedatives: propofol Sedation start date/time: 03/06/2021 4:35 AM Sedation end date/time: 03/06/2021 4:50 AM  Patient tolerance: patient tolerated the procedure well with no immediate complications   .Sedation  Date/Time: 03/06/2021 4:50 AM Performed by: Veryl Speak, MD Authorized by: Veryl Speak, MD   Consent:    Consent obtained:  Written   Consent given by:  Patient   Risks discussed:  Inadequate sedation, allergic reaction, respiratory compromise necessitating ventilatory assistance and intubation and nausea Universal protocol:    Procedure explained and questions answered to patient or proxy's satisfaction: yes     Relevant documents present and verified: yes     Test results available: yes     Site/side marked: yes     Immediately prior to procedure, a time out was called: yes     Patient identity confirmed:  Arm band and verbally with patient Indications:    Procedure performed:  Fracture reduction   Procedure necessitating sedation performed by:  Physician performing sedation Pre-sedation assessment:    Time since last food or drink:  6 hours   ASA classification: class 2 - patient with mild systemic disease     Mallampati score:  I - soft  palate, uvula, fauces, pillars visible   Neck mobility: normal     Pre-sedation assessments completed and reviewed: airway patency and cardiovascular function   Immediate pre-procedure details:    Reassessment: Patient reassessed immediately prior to procedure     Reviewed: vital signs     Verified: bag valve mask available, IV patency confirmed and oxygen available   Procedure details (see MAR for exact dosages):    Preoxygenation:  Nasal cannula   Sedation:  Propofol   Intended level of sedation: deep and moderate (conscious  sedation)   Intra-procedure monitoring:  Blood pressure monitoring, continuous capnometry, continuous pulse oximetry, frequent vital sign checks and cardiac monitor   Intra-procedure events: none     Total Provider sedation time (minutes):  15 Post-procedure details:    Post-sedation assessment completed:  03/06/2021 4:53 AM   Recovery: Patient returned to pre-procedure baseline     Post-sedation assessments completed and reviewed: airway patency, cardiovascular function and mental status     Patient is stable for discharge or admission: yes     Procedure completion:  Tolerated well, no immediate complications  Continuous cardiac monitoring  Medications Ordered in ED Medications  propofol (DIPRIVAN) 10 mg/mL bolus/IV push 26 mg (has no administration in time range)    ED Course/ Medical Decision Making/ A&P   Patient is an 86 year old female presenting here after a fall at home.  She is complaining of pain in her left wrist.  She also hit her head.  CT scan of the head and cervical spine are negative, but x-rays of the left wrist reveal transverse markedly angulated fractures of the distal left radius and ulna.  This was reduced and splinted as per procedure note above.  Patient will be placed in a splint and sling and is to follow-up with hand surgery in the next few days.  Patient to be referred to Dr. Fredna Dow who is on-call for hand surgery  Final Clinical Impression(s) / ED Diagnoses Final diagnoses:  None    Rx / DC Orders ED Discharge Orders     None         Veryl Speak, MD 03/06/21 [phone]

## 2021-03-06 NOTE — ED Provider Triage Note (Signed)
Emergency Medicine Provider Triage Evaluation Note  ARANTZA DARRINGTON , a 86 y.o. female  was evaluated in triage.  Pt complains of a fall that occurred prior to arrival.  Patient states that she bent over to look at the time on her television, lost balance, and fell forward.  She struck the bridge of her nose on a table and fell to the floor.  She reports pain and swelling to the left wrist as well as additional mild pain to the left elbow and left humerus.  Denies any LOC.  Denies any neck or back pain.  Denies any chest or abdominal pain.  Denies any anticoagulant use  Physical Exam  BP (!) 168/91 (BP Location: Right Arm)    Pulse 90    Temp 98.5 F (36.9 C) (Oral)    Resp 17    Ht 4\' 11"  (1.499 m)    Wt 52 kg    SpO2 99%    BMI 23.15 kg/m  Gen:   Awake, no distress   Resp:  Normal effort  MSK:   Moves extremities without difficulty  Other:  Soft tissue swelling and a possible deformity to the left wrist.  Neurovascularly intact distal to the site.  Mild tenderness to the left elbow and left humeral shaft.  No obvious deformities in these regions.  Mild tenderness in the midline cervical spinal region.  No step-offs, crepitus, or deformities.  Bruising noted to the bridge of the nose.  No obvious deformity.  Medical Decision Making  Medically screening exam initiated at 1:43 AM.  Appropriate orders placed.  Ronnie Doss was informed that the remainder of the evaluation will be completed by another provider, this initial triage assessment does not replace that evaluation, and the importance of remaining in the ED until their evaluation is complete.   Rayna Sexton, PA-C 03/06/21 0145

## 2021-03-06 NOTE — ED Triage Notes (Addendum)
Patient lost her balance and fell at home this evening , no LOC , reports left wrist pain with deformity /nasal pain with mild swelling and bruise bled briefly after the fall . She is not taking anticoagulant medication . Hypertensive at arrival .

## 2021-03-10 DIAGNOSIS — S52692A Other fracture of lower end of left ulna, initial encounter for closed fracture: Secondary | ICD-10-CM | POA: Diagnosis not present

## 2021-03-10 DIAGNOSIS — S52572A Other intraarticular fracture of lower end of left radius, initial encounter for closed fracture: Secondary | ICD-10-CM | POA: Diagnosis not present

## 2021-03-13 NOTE — Pre-Procedure Instructions (Signed)
Surgical Instructions    Your procedure is scheduled on Tuesday, February 21st.  Report to [facility] Main Entrance "A" at 5:30 A.M., then check in with the Admitting office.  Call this number if you have problems the morning of surgery:  [phone]   If you have any questions prior to your surgery date call [phone]: Open Monday-Friday 8am-4pm    Remember:  Do not eat or drink after midnight the night before your surgery   Take these medicines the morning of surgery with A SIP OF WATER  ciprofloxacin (CIPRO)  gabapentin (NEURONTIN)  HYDROcodone-acetaminophen (NORCO/VICODIN)-as needed for pain  As of today, STOP taking any Aspirin (unless otherwise instructed by your surgeon) Aleve, Naproxen, Ibuprofen, Motrin, Advil, Goody's, BC's, all herbal medications, fish oil, and all vitamins.                     Do NOT Smoke (Tobacco/Vaping) for 24 hours prior to your procedure.  If you use a CPAP at night, you may bring your mask/headgear for your overnight stay.   Contacts, glasses, piercing's, hearing aid's, dentures or partials may not be worn into surgery, please bring cases for these belongings.    For patients admitted to the hospital, discharge time will be determined by your treatment team.   Patients discharged the day of surgery will not be allowed to drive home, and someone needs to stay with them for 24 hours.  NO VISITORS WILL BE ALLOWED IN PRE-OP WHERE PATIENTS ARE PREPPED FOR SURGERY.  ONLY 1 SUPPORT PERSON MAY BE PRESENT IN THE WAITING ROOM WHILE YOU ARE IN SURGERY.  IF YOU ARE TO BE ADMITTED, ONCE YOU ARE IN YOUR ROOM YOU WILL BE ALLOWED TWO (2) VISITORS. (1) VISITOR MAY STAY OVERNIGHT BUT MUST ARRIVE TO THE ROOM BY 8pm.  Minor children may have two parents present. Special consideration for safety and communication needs will be reviewed on a case by case basis.   Special instructions:   [place]- Preparing For Surgery  Before surgery, you can play an  important role. Because skin is not sterile, your skin needs to be as free of germs as possible. You can reduce the number of germs on your skin by washing with CHG (chlorahexidine gluconate) Soap before surgery.  CHG is an antiseptic cleaner which kills germs and bonds with the skin to continue killing germs even after washing.    Oral Hygiene is also important to reduce your risk of infection.  Remember - BRUSH YOUR TEETH THE MORNING OF SURGERY WITH YOUR REGULAR TOOTHPASTE  Please do not use if you have an allergy to CHG or antibacterial soaps. If your skin becomes reddened/irritated stop using the CHG.  Do not shave (including legs and underarms) for at least 48 hours prior to first CHG shower. It is OK to shave your face.  Please follow these instructions carefully.   Shower the NIGHT BEFORE SURGERY and the MORNING OF SURGERY  If you chose to wash your hair, wash your hair first as usual with your normal shampoo.  After you shampoo, rinse your hair and body thoroughly to remove the shampoo.  Use CHG Soap as you would any other liquid soap. You can apply CHG directly to the skin and wash gently with a scrungie or a clean washcloth.   Apply the CHG Soap to your body ONLY FROM THE NECK DOWN.  Do not use on open wounds or open sores. Avoid contact with your eyes, ears, mouth and  genitals (private parts). Wash Face and genitals (private parts)  with your normal soap.   Wash thoroughly, paying special attention to the area where your surgery will be performed.  Thoroughly rinse your body with warm water from the neck down.  DO NOT shower/wash with your normal soap after using and rinsing off the CHG Soap.  Pat yourself dry with a CLEAN TOWEL.  Wear CLEAN PAJAMAS to bed the night before surgery  Place CLEAN SHEETS on your bed the night before your surgery  DO NOT SLEEP WITH PETS.   Day of Surgery: Shower with CHG soap. Do not wear jewelry, make up, nail polish, gel polish, artificial  nails, or any other type of covering on natural nails including finger and toenails. If patients have artificial nails, gel coating, etc. that need to be removed by a nail salon please have this removed prior to surgery. Surgery may need to be canceled/delayed if the surgeon/anesthesiologist feels like the patient is unable to be adequately monitored. Do not wear lotions, powders, perfumes, or deodorant. Do not shave 48 hours prior to surgery.   Do not bring valuables to the hospital. [facility] is not responsible for any belongings or valuables. Wear Clean/Comfortable clothing the morning of surgery Remember to brush your teeth WITH YOUR REGULAR TOOTHPASTE.   Please read over the following fact sheets that you were given.   3 days prior to your procedure or After your COVID test   You are not required to quarantine however you are required to wear a well-fitting mask when you are out and around people not in your household. If your mask becomes wet or soiled, replace with a new one.   Wash your hands often with soap and water for 20 seconds or clean your hands with an alcohol-based hand sanitizer that contains at least 60% alcohol.   Do not share personal items.   Notify your provider:  o if you are in close contact with someone who has COVID  o or if you develop a fever of 100.4 or greater, sneezing, cough, sore throat, shortness of breath or body aches.

## 2021-03-16 ENCOUNTER — Encounter (HOSPITAL_COMMUNITY): Payer: Self-pay

## 2021-03-16 ENCOUNTER — Other Ambulatory Visit: Payer: Self-pay

## 2021-03-16 ENCOUNTER — Encounter (HOSPITAL_COMMUNITY)
Admission: RE | Admit: 2021-03-16 | Discharge: 2021-03-16 | Disposition: A | Discharge status: Home / Self Care | Payer: Medicare Other | Source: Ambulatory Visit | Attending: Neurosurgery | Admitting: Neurosurgery

## 2021-03-16 VITALS — BP 161/79 | HR 87 | Temp 98.5°F | Resp 17 | Ht 59.0 in | Wt 108.0 lb

## 2021-03-16 DIAGNOSIS — Z01818 Encounter for other preprocedural examination: Secondary | ICD-10-CM | POA: Insufficient documentation

## 2021-03-16 DIAGNOSIS — Z20822 Contact with and (suspected) exposure to covid-19: Secondary | ICD-10-CM | POA: Diagnosis not present

## 2021-03-16 LAB — BASIC METABOLIC PANEL
Anion gap: 8 (ref 5–15)
BUN: 17 mg/dL (ref 8–23)
CO2: 29 mmol/L (ref 22–32)
Calcium: 9 mg/dL (ref 8.9–10.3)
Chloride: 104 mmol/L (ref 98–111)
Creatinine, Ser: 0.79 mg/dL (ref 0.44–1.00)
GFR, Estimated: >60 mL/min (ref >60)
Glucose, Bld: 94 mg/dL (ref 70–99)
Potassium: 3.9 mmol/L (ref 3.5–5.1)
Sodium: 141 mmol/L (ref 135–145)

## 2021-03-16 LAB — CBC
HCT: 36.4 % (ref 36.0–46.0)
Hemoglobin: 11.7 g/dL — ABNORMAL LOW (ref 12.0–15.0)
MCH: 33.3 pg (ref 26.0–34.0)
MCHC: 32.1 g/dL (ref 30.0–36.0)
MCV: 103.7 fL — ABNORMAL HIGH (ref 80.0–100.0)
Platelets: 278 10*3/uL (ref 150–400)
RBC: 3.51 MIL/uL — ABNORMAL LOW (ref 3.87–5.11)
RDW: 14.4 % (ref 11.5–15.5)
WBC: 4.8 10*3/uL (ref 4.0–10.5)
nRBC: 0 % (ref 0.0–0.2)

## 2021-03-16 LAB — SURGICAL PCR SCREEN
MRSA, PCR: NEGATIVE
Staphylococcus aureus: NEGATIVE

## 2021-03-16 NOTE — Progress Notes (Signed)
PCP - Dr. Brigitte Pulse  Cardiologist - Denies  EP- Denies  Endocrine- Denies  Pulm- Denies  Chest x-ray - Denies  EKG - 03/16/21  Stress Test - Denies  ECHO - Denies  Cardiac Cath - Denies  AICD- na PM- na LOOP- na  Nerve Stimulator- Denies  Dialysis- Denies  Sleep Study - Denies CPAP - Denies  LABS- 03/16/21: CBC, BMP, COVID, PCR  ASA- Denies  ERAS- No  HA1C- Denies  Anesthesia- No  Pt denies having chest pain, sob, or fever at this time. All instructions explained to the pt, with a verbal understanding of the material. Pt agrees to go over the instructions while at home for a better understanding. Pt also instructed to wear a mask and social distance if she goes out after being tested for COVID-19. The opportunity to ask questions was provided.    Coronavirus Screening  Have you experienced the following symptoms:  Cough yes/no: No Fever (>100.56F)  yes/no: No Runny nose yes/no: No Sore throat yes/no: No Difficulty breathing/shortness of breath  yes/no: No  Have you or a family member traveled in the last 14 days and where? yes/no: No   If the patient indicates "YES" to the above questions, their PAT will be rescheduled to limit the exposure to others and, the surgeon will be notified. THE PATIENT WILL NEED TO BE ASYMPTOMATIC FOR 14 DAYS.   If the patient is not experiencing any of these symptoms, the PAT nurse will instruct them to NOT bring anyone with them to their appointment since they may have these symptoms or traveled as well.   Please remind your patients and families that hospital visitation restrictions are in effect and the importance of the restrictions.

## 2021-03-17 DIAGNOSIS — S52572D Other intraarticular fracture of lower end of left radius, subsequent encounter for closed fracture with routine healing: Secondary | ICD-10-CM | POA: Diagnosis not present

## 2021-03-17 DIAGNOSIS — M25432 Effusion, left wrist: Secondary | ICD-10-CM | POA: Diagnosis not present

## 2021-03-17 DIAGNOSIS — S52692D Other fracture of lower end of left ulna, subsequent encounter for closed fracture with routine healing: Secondary | ICD-10-CM | POA: Diagnosis not present

## 2021-03-17 DIAGNOSIS — M25632 Stiffness of left wrist, not elsewhere classified: Secondary | ICD-10-CM | POA: Diagnosis not present

## 2021-03-17 DIAGNOSIS — M25532 Pain in left wrist: Secondary | ICD-10-CM | POA: Diagnosis not present

## 2021-03-17 LAB — SARS CORONAVIRUS 2 (TAT 6-24 HRS): SARS Coronavirus 2: NEGATIVE

## 2021-03-18 DIAGNOSIS — R300 Dysuria: Secondary | ICD-10-CM | POA: Diagnosis not present

## 2021-03-25 DIAGNOSIS — S52692D Other fracture of lower end of left ulna, subsequent encounter for closed fracture with routine healing: Secondary | ICD-10-CM | POA: Diagnosis not present

## 2021-03-25 DIAGNOSIS — S52572D Other intraarticular fracture of lower end of left radius, subsequent encounter for closed fracture with routine healing: Secondary | ICD-10-CM | POA: Diagnosis not present

## 2021-04-01 DIAGNOSIS — S52572D Other intraarticular fracture of lower end of left radius, subsequent encounter for closed fracture with routine healing: Secondary | ICD-10-CM | POA: Diagnosis not present

## 2021-04-01 DIAGNOSIS — S52692D Other fracture of lower end of left ulna, subsequent encounter for closed fracture with routine healing: Secondary | ICD-10-CM | POA: Diagnosis not present

## 2021-04-02 DIAGNOSIS — S52692D Other fracture of lower end of left ulna, subsequent encounter for closed fracture with routine healing: Secondary | ICD-10-CM | POA: Diagnosis not present

## 2021-04-02 DIAGNOSIS — M25642 Stiffness of left hand, not elsewhere classified: Secondary | ICD-10-CM | POA: Diagnosis not present

## 2021-04-02 DIAGNOSIS — S52572D Other intraarticular fracture of lower end of left radius, subsequent encounter for closed fracture with routine healing: Secondary | ICD-10-CM | POA: Diagnosis not present

## 2021-04-14 DIAGNOSIS — S52572D Other intraarticular fracture of lower end of left radius, subsequent encounter for closed fracture with routine healing: Secondary | ICD-10-CM | POA: Diagnosis not present

## 2021-04-22 ENCOUNTER — Encounter (HOSPITAL_COMMUNITY): Admission: RE | Payer: Self-pay | Source: Ambulatory Visit

## 2021-04-22 ENCOUNTER — Ambulatory Visit (HOSPITAL_COMMUNITY): Admission: RE | Admit: 2021-04-22 | Payer: Medicare Other | Source: Ambulatory Visit | Admitting: Neurosurgery

## 2021-04-22 SURGERY — LUMBAR LAMINECTOMY/DECOMPRESSION MICRODISCECTOMY 2 LEVELS
Anesthesia: General | Laterality: Bilateral

## 2021-05-05 DIAGNOSIS — S52692D Other fracture of lower end of left ulna, subsequent encounter for closed fracture with routine healing: Secondary | ICD-10-CM | POA: Diagnosis not present

## 2021-05-05 DIAGNOSIS — S52572D Other intraarticular fracture of lower end of left radius, subsequent encounter for closed fracture with routine healing: Secondary | ICD-10-CM | POA: Diagnosis not present

## 2021-05-13 DIAGNOSIS — Z789 Other specified health status: Secondary | ICD-10-CM | POA: Diagnosis not present

## 2021-05-13 DIAGNOSIS — M25632 Stiffness of left wrist, not elsewhere classified: Secondary | ICD-10-CM | POA: Diagnosis not present

## 2021-05-13 DIAGNOSIS — S52572D Other intraarticular fracture of lower end of left radius, subsequent encounter for closed fracture with routine healing: Secondary | ICD-10-CM | POA: Diagnosis not present

## 2021-05-13 DIAGNOSIS — S52692D Other fracture of lower end of left ulna, subsequent encounter for closed fracture with routine healing: Secondary | ICD-10-CM | POA: Diagnosis not present

## 2021-05-13 DIAGNOSIS — Z7409 Other reduced mobility: Secondary | ICD-10-CM | POA: Diagnosis not present

## 2021-05-13 DIAGNOSIS — M25531 Pain in right wrist: Secondary | ICD-10-CM | POA: Diagnosis not present

## 2021-05-20 DIAGNOSIS — S52572D Other intraarticular fracture of lower end of left radius, subsequent encounter for closed fracture with routine healing: Secondary | ICD-10-CM | POA: Diagnosis not present

## 2021-05-20 DIAGNOSIS — Z7409 Other reduced mobility: Secondary | ICD-10-CM | POA: Diagnosis not present

## 2021-05-20 DIAGNOSIS — M25532 Pain in left wrist: Secondary | ICD-10-CM | POA: Diagnosis not present

## 2021-05-20 DIAGNOSIS — S52692D Other fracture of lower end of left ulna, subsequent encounter for closed fracture with routine healing: Secondary | ICD-10-CM | POA: Diagnosis not present

## 2021-05-20 DIAGNOSIS — M25632 Stiffness of left wrist, not elsewhere classified: Secondary | ICD-10-CM | POA: Diagnosis not present

## 2021-05-20 DIAGNOSIS — M25642 Stiffness of left hand, not elsewhere classified: Secondary | ICD-10-CM | POA: Diagnosis not present

## 2021-05-20 DIAGNOSIS — Z789 Other specified health status: Secondary | ICD-10-CM | POA: Diagnosis not present

## 2021-05-27 DIAGNOSIS — M25632 Stiffness of left wrist, not elsewhere classified: Secondary | ICD-10-CM | POA: Diagnosis not present

## 2021-05-27 DIAGNOSIS — Z7409 Other reduced mobility: Secondary | ICD-10-CM | POA: Diagnosis not present

## 2021-05-27 DIAGNOSIS — M25642 Stiffness of left hand, not elsewhere classified: Secondary | ICD-10-CM | POA: Diagnosis not present

## 2021-05-27 DIAGNOSIS — M25532 Pain in left wrist: Secondary | ICD-10-CM | POA: Diagnosis not present

## 2021-05-27 DIAGNOSIS — S52692D Other fracture of lower end of left ulna, subsequent encounter for closed fracture with routine healing: Secondary | ICD-10-CM | POA: Diagnosis not present

## 2021-05-27 DIAGNOSIS — Z789 Other specified health status: Secondary | ICD-10-CM | POA: Diagnosis not present

## 2021-06-02 DIAGNOSIS — S52572D Other intraarticular fracture of lower end of left radius, subsequent encounter for closed fracture with routine healing: Secondary | ICD-10-CM | POA: Diagnosis not present

## 2021-06-02 DIAGNOSIS — S52692D Other fracture of lower end of left ulna, subsequent encounter for closed fracture with routine healing: Secondary | ICD-10-CM | POA: Diagnosis not present

## 2021-06-02 DIAGNOSIS — B351 Tinea unguium: Secondary | ICD-10-CM | POA: Diagnosis not present

## 2021-06-02 DIAGNOSIS — Z7409 Other reduced mobility: Secondary | ICD-10-CM | POA: Diagnosis not present

## 2021-06-02 DIAGNOSIS — M25642 Stiffness of left hand, not elsewhere classified: Secondary | ICD-10-CM | POA: Diagnosis not present

## 2021-06-02 DIAGNOSIS — M25632 Stiffness of left wrist, not elsewhere classified: Secondary | ICD-10-CM | POA: Diagnosis not present

## 2021-06-02 DIAGNOSIS — M25532 Pain in left wrist: Secondary | ICD-10-CM | POA: Diagnosis not present

## 2021-06-02 DIAGNOSIS — Z789 Other specified health status: Secondary | ICD-10-CM | POA: Diagnosis not present

## 2021-06-02 DIAGNOSIS — H6123 Impacted cerumen, bilateral: Secondary | ICD-10-CM | POA: Diagnosis not present

## 2021-06-06 ENCOUNTER — Encounter (HOSPITAL_COMMUNITY): Payer: Self-pay | Admitting: *Deleted

## 2021-06-06 ENCOUNTER — Emergency Department (HOSPITAL_COMMUNITY)
Admission: EM | Admit: 2021-06-06 | Discharge: 2021-06-07 | Disposition: A | Discharge status: Home / Self Care | Payer: Medicare Other | Attending: Emergency Medicine | Admitting: Emergency Medicine

## 2021-06-06 ENCOUNTER — Other Ambulatory Visit: Payer: Self-pay

## 2021-06-06 DIAGNOSIS — R300 Dysuria: Secondary | ICD-10-CM | POA: Diagnosis present

## 2021-06-06 DIAGNOSIS — N390 Urinary tract infection, site not specified: Secondary | ICD-10-CM

## 2021-06-06 LAB — URINALYSIS, ROUTINE W REFLEX MICROSCOPIC
Bilirubin Urine: NEGATIVE
Glucose, UA: NEGATIVE mg/dL
Hgb urine dipstick: NEGATIVE
Ketones, ur: NEGATIVE mg/dL
Nitrite: NEGATIVE
Protein, ur: NEGATIVE mg/dL
Specific Gravity, Urine: 1.01 (ref 1.005–1.030)
pH: 6 (ref 5.0–8.0)

## 2021-06-06 LAB — CBC WITH DIFFERENTIAL/PLATELET
Abs Immature Granulocytes: 0.02 10*3/uL (ref 0.00–0.07)
Basophils Absolute: 0.1 10*3/uL (ref 0.0–0.1)
Basophils Relative: 1 %
Eosinophils Absolute: 0.2 10*3/uL (ref 0.0–0.5)
Eosinophils Relative: 3 %
HCT: 37.9 % (ref 36.0–46.0)
Hemoglobin: 12.5 g/dL (ref 12.0–15.0)
Immature Granulocytes: 0 %
Lymphocytes Relative: 39 %
Lymphs Abs: 2.1 10*3/uL (ref 0.7–4.0)
MCH: 34.1 pg — ABNORMAL HIGH (ref 26.0–34.0)
MCHC: 33 g/dL (ref 30.0–36.0)
MCV: 103.3 fL — ABNORMAL HIGH (ref 80.0–100.0)
Monocytes Absolute: 0.7 10*3/uL (ref 0.1–1.0)
Monocytes Relative: 13 %
Neutro Abs: 2.4 10*3/uL (ref 1.7–7.7)
Neutrophils Relative %: 44 %
Platelets: 229 10*3/uL (ref 150–400)
RBC: 3.67 MIL/uL — ABNORMAL LOW (ref 3.87–5.11)
RDW: 13.6 % (ref 11.5–15.5)
WBC: 5.4 10*3/uL (ref 4.0–10.5)
nRBC: 0 % (ref 0.0–0.2)

## 2021-06-06 LAB — BASIC METABOLIC PANEL
Anion gap: 6 (ref 5–15)
BUN: 20 mg/dL (ref 8–23)
CO2: 29 mmol/L (ref 22–32)
Calcium: 9.4 mg/dL (ref 8.9–10.3)
Chloride: 104 mmol/L (ref 98–111)
Creatinine, Ser: 0.98 mg/dL (ref 0.44–1.00)
GFR, Estimated: 57 mL/min — ABNORMAL LOW (ref >60)
Glucose, Bld: 118 mg/dL — ABNORMAL HIGH (ref 70–99)
Potassium: 4.7 mmol/L (ref 3.5–5.1)
Sodium: 139 mmol/L (ref 135–145)

## 2021-06-06 NOTE — ED Triage Notes (Signed)
The pt has had some burning whenever she urinates for the past week  no temp ?

## 2021-06-06 NOTE — ED Provider Triage Note (Signed)
Emergency Medicine Provider Triage Evaluation Note ? ?Danielle Hernandez , a 86 y.o. female  was evaluated in triage.  Pt complains of dysuria.  Urgency and frequency.  No fevers.  She denies any new or abnormal back pain.  ? ?She reports symptoms for one week. No fevers.   ? ?She has history of UTI but isn't sure if that felt the same.  ? ?She reports white coat hypertension.  She doesn't take any antihypertensives.  ? ?Physical Exam  ?BP (!) 176/82   Pulse 96   Temp 98.8 ?F (37.1 ?C) (Oral)   Resp 16   Ht '4\' 11"'$  (1.499 m)   Wt 49 kg   SpO2 98%   BMI 21.82 kg/m?  ?Gen:   Awake, no distress   ?Resp:  Normal effort  ?MSK:   Moves extremities without difficulty  ?Other:  Normal speech.  ? ?Medical Decision Making  ?Medically screening exam initiated at 10:17 PM.  Appropriate orders placed.  Danielle Hernandez was informed that the remainder of the evaluation will be completed by another provider, this initial triage assessment does not replace that evaluation, and the importance of remaining in the ED until their evaluation is complete. ? ? ?  ?Lorin Glass, PA-C ?06/06/21 2225 ? ?

## 2021-06-07 MED ORDER — CEPHALEXIN 250 MG PO CAPS
500.0000 mg | ORAL_CAPSULE | Freq: Once | ORAL | Status: AC
  Administered 2021-06-07: 500 mg via ORAL
  Filled 2021-06-07: qty 2

## 2021-06-07 MED ORDER — CEPHALEXIN 500 MG PO CAPS: 500.0000 mg | ORAL_CAPSULE | Freq: Four times a day (QID) | ORAL | 0 refills | Status: AC

## 2021-06-07 NOTE — ED Notes (Signed)
Up to the br no assistance needed ?

## 2021-06-07 NOTE — ED Provider Notes (Signed)
?[facility] ?Provider Note ? ? ?CSN: [id number] ?Arrival date & time: 06/06/21  2121 ? ?  ? ?History ? ?Chief Complaint  ?Patient presents with  ? Hematuria  ? ? ?Danielle Hernandez is a 86 y.o. female. ? ?The history is provided by the patient.  ?Dysuria ?Pain quality:  Burning ?Pain severity:  Moderate ?Onset quality:  Gradual ?Duration:  1 week ?Timing:  Constant ?Progression:  Unchanged ?Chronicity:  New ?Recent urinary tract infections: no   ?Relieved by:  Nothing ?Worsened by:  Nothing ?Ineffective treatments:  None tried ?Urinary symptoms: no discolored urine, no foul-smelling urine and no hematuria   ?Associated symptoms: no abdominal pain, no fever, no flank pain, no nausea and no vomiting   ?Risk factors: recurrent urinary tract infections   ? ?  ? ?Home Medications ?Prior to Admission medications   ?Medication Sig Start Date End Date Taking? Authorizing Provider  ?acetaminophen (TYLENOL) 325 MG tablet Take 650 mg by mouth every 6 (six) hours as needed for moderate pain or headache.   Yes [provider]  ?gabapentin (NEURONTIN) 300 MG capsule Take 300 mg by mouth 2 (two) times daily.   Yes [provider]  ?HYDROcodone-acetaminophen (NORCO/VICODIN) 5-325 MG tablet Take 1 tablet by mouth 2 (two) times daily as needed for pain. 02/22/21  Yes [provider]  ?Magnesium 250 MG TABS Take 250 mg by mouth in the morning and at bedtime. 09/18/18  Yes [provider]  ?Multiple Vitamin (MULTIVITAMIN ADULT PO) Take 1 tablet by mouth daily.   Yes [provider]  ?Omega-3 Fatty Acids (OMEGA-3 FISH OIL PO) Take 1 capsule by mouth daily. Unsure of dose   Yes [provider]  ?solifenacin (VESICARE) 10 MG tablet Take 10 mg by mouth at bedtime. 03/10/20  Yes [provider]  ?cephALEXin (KEFLEX) 500 MG capsule Take 1 capsule (500 mg total) by mouth 4 (four) times daily. ?Patient not taking: Reported on 03/10/2021 12/21/20    Blanchie Dessert, MD  ?   ? ?Allergies    ?Penicillins, Sulfa antibiotics, and Sulfa drugs cross reactors   ? ?Review of Systems   ?Review of Systems  ?Constitutional:  Negative for fever.  ?HENT:  Negative for congestion.   ?Eyes:  Negative for redness.  ?Respiratory:  Negative for wheezing and stridor.   ?Gastrointestinal:  Negative for abdominal pain, nausea and vomiting.  ?Genitourinary:  Positive for dysuria and frequency. Negative for flank pain.  ?Neurological:  Negative for facial asymmetry.  ?All other systems reviewed and are negative. ? ?Physical Exam ?Updated Vital Signs ?BP (!) 170/73 (BP Location: Right Arm)   Pulse 78   Temp 98.8 ?F (37.1 ?C) (Oral)   Resp 17   Ht '4\' 11"'$  (1.499 m)   Wt 49 kg   SpO2 94%   BMI 21.82 kg/m?  ?Physical Exam ?Vitals and nursing note reviewed.  ?Constitutional:   ?   General: She is not in acute distress. ?   Appearance: Normal appearance.  ?HENT:  ?   Head: Normocephalic and atraumatic.  ?   Nose: Nose normal.  ?Eyes:  ?   Conjunctiva/sclera: Conjunctivae normal.  ?   Pupils: Pupils are equal, round, and reactive to light.  ?Cardiovascular:  ?   Rate and Rhythm: Normal rate and regular rhythm.  ?   Pulses: Normal pulses.  ?   Heart sounds: Normal heart sounds.  ?Pulmonary:  ?   Effort: Pulmonary effort is normal.  ?  Breath sounds: Normal breath sounds.  ?Abdominal:  ?   General: Bowel sounds are normal.  ?   Palpations: Abdomen is soft.  ?   Tenderness: There is no abdominal tenderness. There is no guarding.  ?Musculoskeletal:     ?   General: Normal range of motion.  ?   Cervical back: Normal range of motion and neck supple.  ?Skin: ?   General: Skin is warm and dry.  ?   Capillary Refill: Capillary refill takes less than 2 seconds.  ?Neurological:  ?   General: No focal deficit present.  ?   Mental Status: She is alert and oriented to person, place, and time.  ?   Deep Tendon Reflexes: Reflexes normal.  ?Psychiatric:     ?   Mood and Affect: Mood normal.     ?    Behavior: Behavior normal.  ? ? ?ED Results / Procedures / Treatments   ?Labs ?(all labs ordered are listed, but only abnormal results are displayed) ?Labs Reviewed  ?URINALYSIS, ROUTINE W REFLEX MICROSCOPIC - Abnormal; Notable for the following components:  ?    Result Value  ? Color, Urine STRAW (*)   ? Leukocytes,Ua MODERATE (*)   ? Bacteria, UA RARE (*)   ? All other components within normal limits  ?CBC WITH DIFFERENTIAL/PLATELET - Abnormal; Notable for the following components:  ? RBC 3.67 (*)   ? MCV 103.3 (*)   ? MCH 34.1 (*)   ? All other components within normal limits  ?BASIC METABOLIC PANEL - Abnormal; Notable for the following components:  ? Glucose, Bld 118 (*)   ? GFR, Estimated 57 (*)   ? All other components within normal limits  ? ? ?EKG ?None ? ?Radiology ?No results found. ? ?Procedures ?Procedures  ? ? ?Medications Ordered in ED ?Medications  ?cephALEXin (KEFLEX) capsule 500 mg (has no administration in time range)  ? ? ?ED Course/ Medical Decision Making/ A&P ?  ?                        ?Medical Decision Making ?Patient with frequent UTI presents with UTI symptoms x 1 week  ? ?Amount and/or Complexity of Data Reviewed ?Independent Historian: friend ?   Details: see above ?External Data Reviewed: notes. ?   Details: previous notes reviewed ?Labs: ordered. ?   Details: urine is consistent with mild UTI ? ?Risk ?Prescription drug management. ?Risk Details: Patient with mild UTI.  Has tolerated keflex in the past 6 months without reaction.  I spoke with patient and have checked Epic.  Patient is definitely not septic.  This is a very mild UTI.  Stable for discharge with close follow up ? ? ?Final Clinical Impression(s) / ED Diagnoses ?Final diagnoses:  ?None  ?Return for intractable cough, coughing up blood, fevers > 100.4 unrelieved by medication, shortness of breath, intractable vomiting, chest pain, shortness of breath, weakness, numbness, changes in speech, facial asymmetry, abdominal pain,  passing out, Inability to tolerate liquids or food, cough, altered mental status or any concerns. No signs of systemic illness or infection. The patient is nontoxic-appearing on exam and vital signs are within normal limits.  ?I have reviewed the triage vital signs and the nursing notes. Pertinent labs & imaging results that were available during my care of the patient were reviewed by me and considered in my medical decision making (see chart for details). After history, exam, and medical workup I feel the patient has  been appropriately medically screened and is safe for discharge home. Pertinent diagnoses were discussed with the patient. Patient was given return precautions. ? ?Rx / DC Orders ?ED Discharge Orders   ? ? None  ? ?  ? ? ?  ?Searcy Miyoshi, MD ?06/07/21 0407 ? ?

## 2021-06-10 DIAGNOSIS — M25532 Pain in left wrist: Secondary | ICD-10-CM | POA: Diagnosis not present

## 2021-06-10 DIAGNOSIS — S52572D Other intraarticular fracture of lower end of left radius, subsequent encounter for closed fracture with routine healing: Secondary | ICD-10-CM | POA: Diagnosis not present

## 2021-06-10 DIAGNOSIS — Z7409 Other reduced mobility: Secondary | ICD-10-CM | POA: Diagnosis not present

## 2021-06-10 DIAGNOSIS — S52692D Other fracture of lower end of left ulna, subsequent encounter for closed fracture with routine healing: Secondary | ICD-10-CM | POA: Diagnosis not present

## 2021-06-10 DIAGNOSIS — M25632 Stiffness of left wrist, not elsewhere classified: Secondary | ICD-10-CM | POA: Diagnosis not present

## 2021-06-10 DIAGNOSIS — Z789 Other specified health status: Secondary | ICD-10-CM | POA: Diagnosis not present

## 2021-06-10 DIAGNOSIS — M25531 Pain in right wrist: Secondary | ICD-10-CM | POA: Diagnosis not present

## 2021-06-10 DIAGNOSIS — M25642 Stiffness of left hand, not elsewhere classified: Secondary | ICD-10-CM | POA: Diagnosis not present

## 2021-06-17 DIAGNOSIS — Z789 Other specified health status: Secondary | ICD-10-CM | POA: Diagnosis not present

## 2021-06-17 DIAGNOSIS — S52692D Other fracture of lower end of left ulna, subsequent encounter for closed fracture with routine healing: Secondary | ICD-10-CM | POA: Diagnosis not present

## 2021-06-17 DIAGNOSIS — M25642 Stiffness of left hand, not elsewhere classified: Secondary | ICD-10-CM | POA: Diagnosis not present

## 2021-06-17 DIAGNOSIS — M25632 Stiffness of left wrist, not elsewhere classified: Secondary | ICD-10-CM | POA: Diagnosis not present

## 2021-06-17 DIAGNOSIS — Z7409 Other reduced mobility: Secondary | ICD-10-CM | POA: Diagnosis not present

## 2021-06-17 DIAGNOSIS — M25432 Effusion, left wrist: Secondary | ICD-10-CM | POA: Diagnosis not present

## 2021-06-17 DIAGNOSIS — M25532 Pain in left wrist: Secondary | ICD-10-CM | POA: Diagnosis not present

## 2021-06-24 DIAGNOSIS — M25532 Pain in left wrist: Secondary | ICD-10-CM | POA: Diagnosis not present

## 2021-06-24 DIAGNOSIS — M25642 Stiffness of left hand, not elsewhere classified: Secondary | ICD-10-CM | POA: Diagnosis not present

## 2021-06-24 DIAGNOSIS — M25632 Stiffness of left wrist, not elsewhere classified: Secondary | ICD-10-CM | POA: Diagnosis not present

## 2021-06-24 DIAGNOSIS — Z789 Other specified health status: Secondary | ICD-10-CM | POA: Diagnosis not present

## 2021-06-24 DIAGNOSIS — M25432 Effusion, left wrist: Secondary | ICD-10-CM | POA: Diagnosis not present

## 2021-06-24 DIAGNOSIS — Z7409 Other reduced mobility: Secondary | ICD-10-CM | POA: Diagnosis not present

## 2021-06-26 DIAGNOSIS — N390 Urinary tract infection, site not specified: Secondary | ICD-10-CM | POA: Diagnosis not present

## 2021-06-26 DIAGNOSIS — R8290 Unspecified abnormal findings in urine: Secondary | ICD-10-CM | POA: Diagnosis not present

## 2021-07-01 DIAGNOSIS — M25642 Stiffness of left hand, not elsewhere classified: Secondary | ICD-10-CM | POA: Diagnosis not present

## 2021-07-01 DIAGNOSIS — S52572D Other intraarticular fracture of lower end of left radius, subsequent encounter for closed fracture with routine healing: Secondary | ICD-10-CM | POA: Diagnosis not present

## 2021-07-01 DIAGNOSIS — Z789 Other specified health status: Secondary | ICD-10-CM | POA: Diagnosis not present

## 2021-07-01 DIAGNOSIS — S52692D Other fracture of lower end of left ulna, subsequent encounter for closed fracture with routine healing: Secondary | ICD-10-CM | POA: Diagnosis not present

## 2021-07-01 DIAGNOSIS — H6123 Impacted cerumen, bilateral: Secondary | ICD-10-CM | POA: Diagnosis not present

## 2021-07-01 DIAGNOSIS — M25632 Stiffness of left wrist, not elsewhere classified: Secondary | ICD-10-CM | POA: Diagnosis not present

## 2021-07-01 DIAGNOSIS — M25532 Pain in left wrist: Secondary | ICD-10-CM | POA: Diagnosis not present

## 2021-07-01 DIAGNOSIS — Z7409 Other reduced mobility: Secondary | ICD-10-CM | POA: Diagnosis not present

## 2021-07-01 DIAGNOSIS — M25531 Pain in right wrist: Secondary | ICD-10-CM | POA: Diagnosis not present

## 2021-07-01 DIAGNOSIS — M25432 Effusion, left wrist: Secondary | ICD-10-CM | POA: Diagnosis not present

## 2021-07-06 DIAGNOSIS — S52572D Other intraarticular fracture of lower end of left radius, subsequent encounter for closed fracture with routine healing: Secondary | ICD-10-CM | POA: Diagnosis not present

## 2021-07-06 DIAGNOSIS — M25632 Stiffness of left wrist, not elsewhere classified: Secondary | ICD-10-CM | POA: Diagnosis not present

## 2021-07-06 DIAGNOSIS — M25532 Pain in left wrist: Secondary | ICD-10-CM | POA: Diagnosis not present

## 2021-07-06 DIAGNOSIS — Z7409 Other reduced mobility: Secondary | ICD-10-CM | POA: Diagnosis not present

## 2021-07-06 DIAGNOSIS — G8929 Other chronic pain: Secondary | ICD-10-CM | POA: Diagnosis not present

## 2021-07-06 DIAGNOSIS — M25642 Stiffness of left hand, not elsewhere classified: Secondary | ICD-10-CM | POA: Diagnosis not present

## 2021-07-06 DIAGNOSIS — M419 Scoliosis, unspecified: Secondary | ICD-10-CM | POA: Diagnosis not present

## 2021-07-06 DIAGNOSIS — M48062 Spinal stenosis, lumbar region with neurogenic claudication: Secondary | ICD-10-CM | POA: Diagnosis not present

## 2021-07-06 DIAGNOSIS — Z79891 Long term (current) use of opiate analgesic: Secondary | ICD-10-CM | POA: Diagnosis not present

## 2021-07-06 DIAGNOSIS — Z789 Other specified health status: Secondary | ICD-10-CM | POA: Diagnosis not present

## 2021-07-13 DIAGNOSIS — M25632 Stiffness of left wrist, not elsewhere classified: Secondary | ICD-10-CM | POA: Diagnosis not present

## 2021-07-13 DIAGNOSIS — Z7409 Other reduced mobility: Secondary | ICD-10-CM | POA: Diagnosis not present

## 2021-07-13 DIAGNOSIS — M25642 Stiffness of left hand, not elsewhere classified: Secondary | ICD-10-CM | POA: Diagnosis not present

## 2021-07-13 DIAGNOSIS — M25532 Pain in left wrist: Secondary | ICD-10-CM | POA: Diagnosis not present

## 2021-07-13 DIAGNOSIS — Z789 Other specified health status: Secondary | ICD-10-CM | POA: Diagnosis not present

## 2021-07-13 DIAGNOSIS — S52692D Other fracture of lower end of left ulna, subsequent encounter for closed fracture with routine healing: Secondary | ICD-10-CM | POA: Diagnosis not present

## 2021-07-13 DIAGNOSIS — M25531 Pain in right wrist: Secondary | ICD-10-CM | POA: Diagnosis not present

## 2021-07-13 DIAGNOSIS — S52572D Other intraarticular fracture of lower end of left radius, subsequent encounter for closed fracture with routine healing: Secondary | ICD-10-CM | POA: Diagnosis not present

## 2021-07-13 DIAGNOSIS — M25432 Effusion, left wrist: Secondary | ICD-10-CM | POA: Diagnosis not present

## 2021-07-20 DIAGNOSIS — S52572D Other intraarticular fracture of lower end of left radius, subsequent encounter for closed fracture with routine healing: Secondary | ICD-10-CM | POA: Diagnosis not present

## 2021-07-20 DIAGNOSIS — H6123 Impacted cerumen, bilateral: Secondary | ICD-10-CM | POA: Diagnosis not present

## 2021-07-20 DIAGNOSIS — Z7409 Other reduced mobility: Secondary | ICD-10-CM | POA: Diagnosis not present

## 2021-07-20 DIAGNOSIS — M25432 Effusion, left wrist: Secondary | ICD-10-CM | POA: Diagnosis not present

## 2021-07-20 DIAGNOSIS — M25531 Pain in right wrist: Secondary | ICD-10-CM | POA: Diagnosis not present

## 2021-07-20 DIAGNOSIS — S52692D Other fracture of lower end of left ulna, subsequent encounter for closed fracture with routine healing: Secondary | ICD-10-CM | POA: Diagnosis not present

## 2021-07-20 DIAGNOSIS — Z789 Other specified health status: Secondary | ICD-10-CM | POA: Diagnosis not present

## 2021-07-20 DIAGNOSIS — M25642 Stiffness of left hand, not elsewhere classified: Secondary | ICD-10-CM | POA: Diagnosis not present

## 2021-07-20 DIAGNOSIS — M25532 Pain in left wrist: Secondary | ICD-10-CM | POA: Diagnosis not present

## 2021-07-20 DIAGNOSIS — M25632 Stiffness of left wrist, not elsewhere classified: Secondary | ICD-10-CM | POA: Diagnosis not present

## 2021-07-30 DIAGNOSIS — Z789 Other specified health status: Secondary | ICD-10-CM | POA: Diagnosis not present

## 2021-07-30 DIAGNOSIS — Z7409 Other reduced mobility: Secondary | ICD-10-CM | POA: Diagnosis not present

## 2021-08-05 DIAGNOSIS — Z789 Other specified health status: Secondary | ICD-10-CM | POA: Diagnosis not present

## 2021-08-05 DIAGNOSIS — M25531 Pain in right wrist: Secondary | ICD-10-CM | POA: Diagnosis not present

## 2021-08-05 DIAGNOSIS — S52692D Other fracture of lower end of left ulna, subsequent encounter for closed fracture with routine healing: Secondary | ICD-10-CM | POA: Diagnosis not present

## 2021-08-05 DIAGNOSIS — Z7409 Other reduced mobility: Secondary | ICD-10-CM | POA: Diagnosis not present

## 2021-08-05 DIAGNOSIS — S52572D Other intraarticular fracture of lower end of left radius, subsequent encounter for closed fracture with routine healing: Secondary | ICD-10-CM | POA: Diagnosis not present

## 2021-08-05 DIAGNOSIS — M25642 Stiffness of left hand, not elsewhere classified: Secondary | ICD-10-CM | POA: Diagnosis not present

## 2021-08-05 DIAGNOSIS — M25532 Pain in left wrist: Secondary | ICD-10-CM | POA: Diagnosis not present

## 2021-08-05 DIAGNOSIS — M25632 Stiffness of left wrist, not elsewhere classified: Secondary | ICD-10-CM | POA: Diagnosis not present

## 2021-08-12 DIAGNOSIS — R300 Dysuria: Secondary | ICD-10-CM | POA: Diagnosis not present

## 2021-08-19 DIAGNOSIS — S52572D Other intraarticular fracture of lower end of left radius, subsequent encounter for closed fracture with routine healing: Secondary | ICD-10-CM | POA: Diagnosis not present

## 2021-08-19 DIAGNOSIS — Z7409 Other reduced mobility: Secondary | ICD-10-CM | POA: Diagnosis not present

## 2021-08-19 DIAGNOSIS — Z789 Other specified health status: Secondary | ICD-10-CM | POA: Diagnosis not present

## 2021-08-19 DIAGNOSIS — S52692D Other fracture of lower end of left ulna, subsequent encounter for closed fracture with routine healing: Secondary | ICD-10-CM | POA: Diagnosis not present

## 2021-08-19 DIAGNOSIS — M25632 Stiffness of left wrist, not elsewhere classified: Secondary | ICD-10-CM | POA: Diagnosis not present

## 2021-08-19 DIAGNOSIS — M25531 Pain in right wrist: Secondary | ICD-10-CM | POA: Diagnosis not present

## 2021-08-19 DIAGNOSIS — M25532 Pain in left wrist: Secondary | ICD-10-CM | POA: Diagnosis not present

## 2021-08-19 DIAGNOSIS — M25642 Stiffness of left hand, not elsewhere classified: Secondary | ICD-10-CM | POA: Diagnosis not present

## 2021-09-02 DIAGNOSIS — M25642 Stiffness of left hand, not elsewhere classified: Secondary | ICD-10-CM | POA: Diagnosis not present

## 2021-09-02 DIAGNOSIS — S52692D Other fracture of lower end of left ulna, subsequent encounter for closed fracture with routine healing: Secondary | ICD-10-CM | POA: Diagnosis not present

## 2021-09-02 DIAGNOSIS — M25632 Stiffness of left wrist, not elsewhere classified: Secondary | ICD-10-CM | POA: Diagnosis not present

## 2021-09-02 DIAGNOSIS — M25532 Pain in left wrist: Secondary | ICD-10-CM | POA: Diagnosis not present

## 2021-09-02 DIAGNOSIS — M25531 Pain in right wrist: Secondary | ICD-10-CM | POA: Diagnosis not present

## 2021-09-02 DIAGNOSIS — S52572D Other intraarticular fracture of lower end of left radius, subsequent encounter for closed fracture with routine healing: Secondary | ICD-10-CM | POA: Diagnosis not present

## 2021-09-02 DIAGNOSIS — Z7409 Other reduced mobility: Secondary | ICD-10-CM | POA: Diagnosis not present

## 2021-09-02 DIAGNOSIS — M25432 Effusion, left wrist: Secondary | ICD-10-CM | POA: Diagnosis not present

## 2021-09-02 DIAGNOSIS — Z789 Other specified health status: Secondary | ICD-10-CM | POA: Diagnosis not present

## 2021-09-04 ENCOUNTER — Other Ambulatory Visit: Payer: Self-pay | Admitting: Neurosurgery

## 2021-09-22 DIAGNOSIS — M48062 Spinal stenosis, lumbar region with neurogenic claudication: Secondary | ICD-10-CM | POA: Diagnosis not present

## 2021-09-22 DIAGNOSIS — M419 Scoliosis, unspecified: Secondary | ICD-10-CM | POA: Diagnosis not present

## 2021-09-22 DIAGNOSIS — M5416 Radiculopathy, lumbar region: Secondary | ICD-10-CM | POA: Diagnosis not present

## 2021-09-23 DIAGNOSIS — H6123 Impacted cerumen, bilateral: Secondary | ICD-10-CM | POA: Diagnosis not present

## 2021-10-06 DIAGNOSIS — R300 Dysuria: Secondary | ICD-10-CM | POA: Diagnosis not present

## 2021-10-26 ENCOUNTER — Other Ambulatory Visit: Payer: Self-pay | Admitting: Neurosurgery

## 2021-10-26 NOTE — Pre-Procedure Instructions (Signed)
Surgical Instructions    Your procedure is scheduled on Monday, October 9th.  Report to [facility] Main Entrance "A" at 5:30 A.M., then check in with the Admitting office.  Call this number if you have problems the morning of surgery:  [phone]   If you have any questions prior to your surgery date call [phone]: Open Monday-Friday 8am-4pm    Remember:  Do not eat or drink after midnight the night before your surgery    Take these medicines the morning of surgery with A SIP OF WATER  gabapentin (NEURONTIN) Vibegron (GEMTESA)   Take these medications as needed: acetaminophen (TYLENOL) HYDROcodone-acetaminophen (NORCO/VICODIN)    As of today, STOP taking any Aspirin (unless otherwise instructed by your surgeon) Aleve, Naproxen, Ibuprofen, Motrin, Advil, Goody's, BC's, all herbal medications, fish oil, and all vitamins.                     Do NOT Smoke (Tobacco/Vaping) for 24 hours prior to your procedure.  If you use a CPAP at night, you may bring your mask/headgear for your overnight stay.   Contacts, glasses, piercing's, hearing aid's, dentures or partials may not be worn into surgery, please bring cases for these belongings.    For patients admitted to the hospital, discharge time will be determined by your treatment team.   Patients discharged the day of surgery will not be allowed to drive home, and someone needs to stay with them for 24 hours.  SURGICAL WAITING ROOM VISITATION Patients having surgery or a procedure may have no more than 2 support people in the waiting area - these visitors may rotate.   Children under the age of 67 must have an adult with them who is not the patient. If the patient needs to stay at the hospital during part of their recovery, the visitor guidelines for inpatient rooms apply. Pre-op nurse will coordinate an appropriate time for 1 support person to accompany patient in pre-op.  This support person may not rotate.   Please refer to  the [facility] website for the visitor guidelines for Inpatients (after your surgery is over and you are in a regular room).    Special instructions:   [facility]- Preparing For Surgery  Before surgery, you can play an important role. Because skin is not sterile, your skin needs to be as free of germs as possible. You can reduce the number of germs on your skin by washing with CHG (chlorahexidine gluconate) Soap before surgery.  CHG is an antiseptic cleaner which kills germs and bonds with the skin to continue killing germs even after washing.    Oral Hygiene is also important to reduce your risk of infection.  Remember - BRUSH YOUR TEETH THE MORNING OF SURGERY WITH YOUR REGULAR TOOTHPASTE  Please do not use if you have an allergy to CHG or antibacterial soaps. If your skin becomes reddened/irritated stop using the CHG.  Do not shave (including legs and underarms) for at least 48 hours prior to first CHG shower. It is OK to shave your face.  Please follow these instructions carefully.   Shower the NIGHT BEFORE SURGERY and the MORNING OF SURGERY  If you chose to wash your hair, wash your hair first as usual with your normal shampoo.  After you shampoo, rinse your hair and body thoroughly to remove the shampoo.  Use CHG Soap as you would any other liquid soap. You can apply CHG directly to the skin and wash gently with a  scrungie or a clean washcloth.   Apply the CHG Soap to your body ONLY FROM THE NECK DOWN.  Do not use on open wounds or open sores. Avoid contact with your eyes, ears, mouth and genitals (private parts). Wash Face and genitals (private parts)  with your normal soap.   Wash thoroughly, paying special attention to the area where your surgery will be performed.  Thoroughly rinse your body with warm water from the neck down.  DO NOT shower/wash with your normal soap after using and rinsing off the CHG Soap.  Pat yourself dry with a CLEAN TOWEL.  Wear CLEAN PAJAMAS to bed  the night before surgery  Place CLEAN SHEETS on your bed the night before your surgery  DO NOT SLEEP WITH PETS.   Day of Surgery: Take a shower with CHG soap. Do not wear jewelry or makeup Do not wear lotions, powders, perfumes, or deodorant. Do not shave 48 hours prior to surgery.  Do not bring valuables to the hospital.  [facility] is not responsible for any belongings or valuables. Do not wear nail polish, gel polish, artificial nails, or any other type of covering on natural nails (fingers and toes) If you have artificial nails or gel coating that need to be removed by a nail salon, please have this removed prior to surgery. Artificial nails or gel coating may interfere with anesthesia's ability to adequately monitor your vital signs. Wear Clean/Comfortable clothing the morning of surgery Remember to brush your teeth WITH YOUR REGULAR TOOTHPASTE.   Please read over the following fact sheets that you were given.    If you received a COVID test during your pre-op visit  it is requested that you wear a mask when out in public, stay away from anyone that may not be feeling well and notify your surgeon if you develop symptoms. If you have been in contact with anyone that has tested positive in the last 10 days please notify you surgeon.

## 2021-10-27 ENCOUNTER — Encounter (HOSPITAL_COMMUNITY): Payer: Self-pay

## 2021-10-27 ENCOUNTER — Encounter (HOSPITAL_COMMUNITY)
Admission: RE | Admit: 2021-10-27 | Discharge: 2021-10-27 | Disposition: A | Discharge status: Home / Self Care | Payer: Medicare Other | Source: Ambulatory Visit | Attending: Neurosurgery | Admitting: Neurosurgery

## 2021-10-27 ENCOUNTER — Other Ambulatory Visit: Payer: Self-pay

## 2021-10-27 VITALS — BP 167/62 | HR 70 | Temp 98.1°F | Resp 17

## 2021-10-27 DIAGNOSIS — I10 Essential (primary) hypertension: Secondary | ICD-10-CM | POA: Insufficient documentation

## 2021-10-27 DIAGNOSIS — Z01812 Encounter for preprocedural laboratory examination: Secondary | ICD-10-CM | POA: Diagnosis not present

## 2021-10-27 DIAGNOSIS — Z01818 Encounter for other preprocedural examination: Secondary | ICD-10-CM

## 2021-10-27 LAB — BASIC METABOLIC PANEL
Anion gap: 9 (ref 5–15)
BUN: 19 mg/dL (ref 8–23)
CO2: 29 mmol/L (ref 22–32)
Calcium: 9.5 mg/dL (ref 8.9–10.3)
Chloride: 104 mmol/L (ref 98–111)
Creatinine, Ser: 0.91 mg/dL (ref 0.44–1.00)
GFR, Estimated: >60 mL/min (ref >60)
Glucose, Bld: 97 mg/dL (ref 70–99)
Potassium: 4.7 mmol/L (ref 3.5–5.1)
Sodium: 142 mmol/L (ref 135–145)

## 2021-10-27 LAB — CBC
HCT: 39.4 % (ref 36.0–46.0)
Hemoglobin: 12.8 g/dL (ref 12.0–15.0)
MCH: 32.1 pg (ref 26.0–34.0)
MCHC: 32.5 g/dL (ref 30.0–36.0)
MCV: 98.7 fL (ref 80.0–100.0)
Platelets: 226 10*3/uL (ref 150–400)
RBC: 3.99 MIL/uL (ref 3.87–5.11)
RDW: 13.7 % (ref 11.5–15.5)
WBC: 5.9 10*3/uL (ref 4.0–10.5)
nRBC: 0 % (ref 0.0–0.2)

## 2021-10-27 LAB — SURGICAL PCR SCREEN
MRSA, PCR: NEGATIVE
Staphylococcus aureus: NEGATIVE

## 2021-10-27 NOTE — Progress Notes (Signed)
PCP - Dr. Mayra Neer Cardiologist - denies Renal: Dr. Bjorn Loser   PPM/ICD - n/a  Chest x-ray - n/a EKG - 03/16/21 Stress Test - denies ECHO - denies Cardiac Cath - denies  Sleep Study - denies CPAP - denies  Blood Thinner Instructions: n/a Aspirin Instructions: n/a  NPO at MD  COVID TEST- n/a  Anesthesia review: Yes, requested last 2 office visit notes at the request of Karoline Caldwell, PA-C.   Patient denies shortness of breath, fever, cough and chest pain at PAT appointment   All instructions explained to the patient, with a verbal understanding of the material. Patient agrees to go over the instructions while at home for a better understanding. Patient also instructed to self quarantine after being tested for COVID-19. The opportunity to ask questions was provided.

## 2021-10-30 DIAGNOSIS — M549 Dorsalgia, unspecified: Secondary | ICD-10-CM | POA: Diagnosis not present

## 2021-10-30 DIAGNOSIS — Z01810 Encounter for preprocedural cardiovascular examination: Secondary | ICD-10-CM | POA: Diagnosis not present

## 2021-11-02 ENCOUNTER — Ambulatory Visit (HOSPITAL_BASED_OUTPATIENT_CLINIC_OR_DEPARTMENT_OTHER): Payer: Medicare Other | Admitting: Anesthesiology

## 2021-11-02 ENCOUNTER — Encounter (HOSPITAL_COMMUNITY): Payer: Self-pay | Admitting: Neurosurgery

## 2021-11-02 ENCOUNTER — Other Ambulatory Visit: Payer: Self-pay

## 2021-11-02 ENCOUNTER — Ambulatory Visit (HOSPITAL_COMMUNITY)
Admission: RE | Admit: 2021-11-02 | Discharge: 2021-11-03 | Disposition: A | Discharge status: Home / Self Care | Payer: Medicare Other | Source: Ambulatory Visit | Attending: Neurosurgery | Admitting: Neurosurgery

## 2021-11-02 ENCOUNTER — Ambulatory Visit (HOSPITAL_COMMUNITY): Payer: Medicare Other | Admitting: Physician Assistant

## 2021-11-02 ENCOUNTER — Ambulatory Visit (HOSPITAL_COMMUNITY): Payer: Medicare Other

## 2021-11-02 ENCOUNTER — Encounter (HOSPITAL_COMMUNITY)
Admission: RE | Disposition: A | Discharge status: Home / Self Care | Payer: Self-pay | Source: Ambulatory Visit | Attending: Neurosurgery

## 2021-11-02 DIAGNOSIS — G959 Disease of spinal cord, unspecified: Secondary | ICD-10-CM | POA: Diagnosis not present

## 2021-11-02 DIAGNOSIS — M4805 Spinal stenosis, thoracolumbar region: Secondary | ICD-10-CM | POA: Diagnosis not present

## 2021-11-02 DIAGNOSIS — G992 Myelopathy in diseases classified elsewhere: Secondary | ICD-10-CM | POA: Diagnosis present

## 2021-11-02 DIAGNOSIS — M5416 Radiculopathy, lumbar region: Secondary | ICD-10-CM

## 2021-11-02 DIAGNOSIS — M48062 Spinal stenosis, lumbar region with neurogenic claudication: Secondary | ICD-10-CM | POA: Insufficient documentation

## 2021-11-02 DIAGNOSIS — I10 Essential (primary) hypertension: Secondary | ICD-10-CM | POA: Insufficient documentation

## 2021-11-02 DIAGNOSIS — Z981 Arthrodesis status: Secondary | ICD-10-CM | POA: Diagnosis not present

## 2021-11-02 DIAGNOSIS — M4804 Spinal stenosis, thoracic region: Secondary | ICD-10-CM | POA: Diagnosis not present

## 2021-11-02 HISTORY — PX: LUMBAR LAMINECTOMY/DECOMPRESSION MICRODISCECTOMY: SHX5026

## 2021-11-02 SURGERY — LUMBAR LAMINECTOMY/DECOMPRESSION MICRODISCECTOMY 2 LEVELS
Anesthesia: General | Laterality: Bilateral

## 2021-11-02 MED ORDER — ACETAMINOPHEN 500 MG PO TABS: 1000.0000 mg | ORAL_TABLET | Freq: Once | ORAL | Status: DC

## 2021-11-02 MED ORDER — ZOLPIDEM TARTRATE 5 MG PO TABS: 5.0000 mg | ORAL_TABLET | Freq: Every evening | ORAL | Status: DC | PRN

## 2021-11-02 MED ORDER — OXYCODONE HCL 5 MG PO TABS: 10.0000 mg | ORAL_TABLET | ORAL | Status: DC | PRN

## 2021-11-02 MED ORDER — BACITRACIN ZINC 500 UNIT/GM EX OINT
TOPICAL_OINTMENT | CUTANEOUS | Status: DC | PRN
  Administered 2021-11-02: 1 via TOPICAL

## 2021-11-02 MED ORDER — VANCOMYCIN HCL IN DEXTROSE 1-5 GM/200ML-% IV SOLN
1000.0000 mg | INTRAVENOUS | Status: AC
  Administered 2021-11-02: 1000 mg via INTRAVENOUS
  Filled 2021-11-02: qty 200

## 2021-11-02 MED ORDER — PHENOL 1.4 % MT LIQD: 1.0000 | OROMUCOSAL | Status: DC | PRN

## 2021-11-02 MED ORDER — ACETAMINOPHEN 500 MG PO TABS
1000.0000 mg | ORAL_TABLET | Freq: Four times a day (QID) | ORAL | Status: AC
  Administered 2021-11-02 – 2021-11-03 (×3): 1000 mg via ORAL
  Filled 2021-11-02 (×3): qty 2

## 2021-11-02 MED ORDER — ESTRADIOL 0.1 MG/GM VA CREA
1.0000 | TOPICAL_CREAM | Freq: Every day | VAGINAL | Status: DC
  Administered 2021-11-02: 1 via VAGINAL
  Filled 2021-11-02: qty 42.5

## 2021-11-02 MED ORDER — CHLORHEXIDINE GLUCONATE CLOTH 2 % EX PADS: 6.0000 | MEDICATED_PAD | Freq: Once | CUTANEOUS | Status: DC

## 2021-11-02 MED ORDER — LIDOCAINE 2% (20 MG/ML) 5 ML SYRINGE
INTRAMUSCULAR | Status: AC
  Filled 2021-11-02: qty 5

## 2021-11-02 MED ORDER — ORAL CARE MOUTH RINSE: 15.0000 mL | Freq: Once | OROMUCOSAL | Status: AC

## 2021-11-02 MED ORDER — SODIUM CHLORIDE 0.9% FLUSH: 3.0000 mL | INTRAVENOUS | Status: DC | PRN

## 2021-11-02 MED ORDER — BUPIVACAINE-EPINEPHRINE (PF) 0.5% -1:200000 IJ SOLN
INTRAMUSCULAR | Status: AC
  Filled 2021-11-02: qty 30

## 2021-11-02 MED ORDER — BUPIVACAINE-EPINEPHRINE (PF) 0.5% -1:200000 IJ SOLN
INTRAMUSCULAR | Status: DC | PRN
  Administered 2021-11-02: 20 mL

## 2021-11-02 MED ORDER — SODIUM CHLORIDE 0.9% FLUSH
3.0000 mL | Freq: Two times a day (BID) | INTRAVENOUS | Status: DC
  Administered 2021-11-02 (×2): 3 mL via INTRAVENOUS

## 2021-11-02 MED ORDER — PHENYLEPHRINE HCL-NACL 20-0.9 MG/250ML-% IV SOLN
INTRAVENOUS | Status: DC | PRN
  Administered 2021-11-02: 20 ug/min via INTRAVENOUS

## 2021-11-02 MED ORDER — ONDANSETRON HCL 4 MG/2ML IJ SOLN: 4.0000 mg | Freq: Four times a day (QID) | INTRAMUSCULAR | Status: DC | PRN

## 2021-11-02 MED ORDER — GABAPENTIN 300 MG PO CAPS
300.0000 mg | ORAL_CAPSULE | Freq: Two times a day (BID) | ORAL | Status: DC
  Administered 2021-11-02 (×2): 300 mg via ORAL
  Filled 2021-11-02 (×3): qty 1

## 2021-11-02 MED ORDER — ACETAMINOPHEN 325 MG PO TABS: 650.0000 mg | ORAL_TABLET | ORAL | Status: DC | PRN

## 2021-11-02 MED ORDER — THROMBIN (RECOMBINANT) 5000 UNITS EX SOLR
CUTANEOUS | Status: AC
  Filled 2021-11-02: qty 5000

## 2021-11-02 MED ORDER — LIDOCAINE 2% (20 MG/ML) 5 ML SYRINGE
INTRAMUSCULAR | Status: DC | PRN
  Administered 2021-11-02: 80 mg via INTRAVENOUS

## 2021-11-02 MED ORDER — PROPOFOL 10 MG/ML IV BOLUS
INTRAVENOUS | Status: AC
  Filled 2021-11-02: qty 20

## 2021-11-02 MED ORDER — DOCUSATE SODIUM 100 MG PO CAPS
100.0000 mg | ORAL_CAPSULE | Freq: Two times a day (BID) | ORAL | Status: DC
  Administered 2021-11-02 (×2): 100 mg via ORAL
  Filled 2021-11-02 (×3): qty 1

## 2021-11-02 MED ORDER — DEXAMETHASONE SODIUM PHOSPHATE 10 MG/ML IJ SOLN
INTRAMUSCULAR | Status: AC
  Filled 2021-11-02: qty 1

## 2021-11-02 MED ORDER — SUGAMMADEX SODIUM 200 MG/2ML IV SOLN
INTRAVENOUS | Status: DC | PRN
  Administered 2021-11-02 (×2): 100 mg via INTRAVENOUS

## 2021-11-02 MED ORDER — SODIUM CHLORIDE 0.9 % IV SOLN
250.0000 mL | INTRAVENOUS | Status: DC
  Administered 2021-11-02: 250 mL via INTRAVENOUS

## 2021-11-02 MED ORDER — OXYCODONE HCL 5 MG PO TABS: 5.0000 mg | ORAL_TABLET | ORAL | Status: DC | PRN

## 2021-11-02 MED ORDER — VANCOMYCIN HCL 500 MG/100ML IV SOLN
500.0000 mg | Freq: Once | INTRAVENOUS | Status: AC
  Administered 2021-11-02: 500 mg via INTRAVENOUS
  Filled 2021-11-02: qty 100

## 2021-11-02 MED ORDER — FENTANYL CITRATE (PF) 250 MCG/5ML IJ SOLN
INTRAMUSCULAR | Status: DC | PRN
  Administered 2021-11-02 (×2): 25 ug via INTRAVENOUS
  Administered 2021-11-02: 50 ug via INTRAVENOUS
  Administered 2021-11-02: 25 ug via INTRAVENOUS

## 2021-11-02 MED ORDER — BACITRACIN ZINC 500 UNIT/GM EX OINT
TOPICAL_OINTMENT | CUTANEOUS | Status: AC
  Filled 2021-11-02: qty 28.35

## 2021-11-02 MED ORDER — ROCURONIUM BROMIDE 10 MG/ML (PF) SYRINGE
PREFILLED_SYRINGE | INTRAVENOUS | Status: DC | PRN
  Administered 2021-11-02 (×2): 20 mg via INTRAVENOUS
  Administered 2021-11-02: 40 mg via INTRAVENOUS

## 2021-11-02 MED ORDER — ARTIFICIAL TEARS OPHTHALMIC OINT
TOPICAL_OINTMENT | OPHTHALMIC | Status: DC | PRN
  Administered 2021-11-02: 1 via OPHTHALMIC

## 2021-11-02 MED ORDER — BISACODYL 10 MG RE SUPP: 10.0000 mg | Freq: Every day | RECTAL | Status: DC | PRN

## 2021-11-02 MED ORDER — ONDANSETRON HCL 4 MG/2ML IJ SOLN
INTRAMUSCULAR | Status: DC | PRN
  Administered 2021-11-02: 4 mg via INTRAVENOUS

## 2021-11-02 MED ORDER — DEXAMETHASONE SODIUM PHOSPHATE 10 MG/ML IJ SOLN
INTRAMUSCULAR | Status: DC | PRN
  Administered 2021-11-02: 5 mg via INTRAVENOUS

## 2021-11-02 MED ORDER — 0.9 % SODIUM CHLORIDE (POUR BTL) OPTIME
TOPICAL | Status: DC | PRN
  Administered 2021-11-02: 1000 mL

## 2021-11-02 MED ORDER — LACTATED RINGERS IV SOLN: INTRAVENOUS | Status: DC

## 2021-11-02 MED ORDER — MORPHINE SULFATE (PF) 4 MG/ML IV SOLN: 4.0000 mg | INTRAVENOUS | Status: DC | PRN

## 2021-11-02 MED ORDER — ONDANSETRON HCL 4 MG/2ML IJ SOLN
INTRAMUSCULAR | Status: AC
  Filled 2021-11-02: qty 2

## 2021-11-02 MED ORDER — PHENYLEPHRINE 80 MCG/ML (10ML) SYRINGE FOR IV PUSH (FOR BLOOD PRESSURE SUPPORT)
PREFILLED_SYRINGE | INTRAVENOUS | Status: DC | PRN
  Administered 2021-11-02 (×2): 160 ug via INTRAVENOUS

## 2021-11-02 MED ORDER — HYDROCODONE-ACETAMINOPHEN 5-325 MG PO TABS
1.0000 | ORAL_TABLET | ORAL | Status: DC | PRN
  Administered 2021-11-02: 2 via ORAL
  Administered 2021-11-02: 1 via ORAL
  Filled 2021-11-02: qty 2
  Filled 2021-11-02: qty 1

## 2021-11-02 MED ORDER — PHENYLEPHRINE 80 MCG/ML (10ML) SYRINGE FOR IV PUSH (FOR BLOOD PRESSURE SUPPORT)
PREFILLED_SYRINGE | INTRAVENOUS | Status: AC
  Filled 2021-11-02: qty 10

## 2021-11-02 MED ORDER — THROMBIN 5000 UNITS EX SOLR
CUTANEOUS | Status: DC | PRN
  Administered 2021-11-02: 5000 [IU] via TOPICAL

## 2021-11-02 MED ORDER — FENTANYL CITRATE (PF) 250 MCG/5ML IJ SOLN
INTRAMUSCULAR | Status: AC
  Filled 2021-11-02: qty 5

## 2021-11-02 MED ORDER — CYCLOBENZAPRINE HCL 10 MG PO TABS: 10.0000 mg | ORAL_TABLET | Freq: Three times a day (TID) | ORAL | Status: DC | PRN

## 2021-11-02 MED ORDER — PROPOFOL 10 MG/ML IV BOLUS
INTRAVENOUS | Status: DC | PRN
  Administered 2021-11-02: 150 mg via INTRAVENOUS

## 2021-11-02 MED ORDER — HYDROMORPHONE HCL 1 MG/ML IJ SOLN
INTRAMUSCULAR | Status: AC
  Administered 2021-11-02: 0.25 mg via INTRAVENOUS
  Filled 2021-11-02: qty 1

## 2021-11-02 MED ORDER — ROCURONIUM BROMIDE 10 MG/ML (PF) SYRINGE
PREFILLED_SYRINGE | INTRAVENOUS | Status: AC
  Filled 2021-11-02: qty 10

## 2021-11-02 MED ORDER — HYDROMORPHONE HCL 1 MG/ML IJ SOLN
0.2500 mg | INTRAMUSCULAR | Status: DC | PRN
  Administered 2021-11-02 (×3): 0.25 mg via INTRAVENOUS

## 2021-11-02 MED ORDER — ACETAMINOPHEN 650 MG RE SUPP: 650.0000 mg | RECTAL | Status: DC | PRN

## 2021-11-02 MED ORDER — MENTHOL 3 MG MT LOZG: 1.0000 | LOZENGE | OROMUCOSAL | Status: DC | PRN

## 2021-11-02 MED ORDER — CHLORHEXIDINE GLUCONATE 0.12 % MT SOLN
15.0000 mL | Freq: Once | OROMUCOSAL | Status: AC
  Administered 2021-11-02: 15 mL via OROMUCOSAL
  Filled 2021-11-02: qty 15

## 2021-11-02 MED ORDER — ONDANSETRON HCL 4 MG PO TABS: 4.0000 mg | ORAL_TABLET | Freq: Four times a day (QID) | ORAL | Status: DC | PRN

## 2021-11-02 MED ORDER — ACETAMINOPHEN 10 MG/ML IV SOLN: 1000.0000 mg | Freq: Once | INTRAVENOUS | Status: DC | PRN

## 2021-11-02 SURGICAL SUPPLY — 46 items
APL SKNCLS STERI-STRIP NONHPOA (GAUZE/BANDAGES/DRESSINGS) ×1
BAG COUNTER SPONGE SURGICOUNT (BAG) ×1 IMPLANT
BAG SPNG CNTER NS LX DISP (BAG) ×1
BAND INSRT 18 STRL LF DISP RB (MISCELLANEOUS) ×2
BAND RUBBER #18 3X1/16 STRL (MISCELLANEOUS) ×2 IMPLANT
BENZOIN TINCTURE PRP APPL 2/3 (GAUZE/BANDAGES/DRESSINGS) ×1 IMPLANT
BLADE CLIPPER SURG (BLADE) IMPLANT
BUR MATCHSTICK NEURO 3.0 LAGG (BURR) ×1 IMPLANT
BUR PRECISION FLUTE 6.0 (BURR) ×1 IMPLANT
CANISTER SUCT 3000ML PPV (MISCELLANEOUS) ×1 IMPLANT
DRAPE LAPAROTOMY 100X72X124 (DRAPES) ×1 IMPLANT
DRAPE MICROSCOPE SLANT 54X150 (MISCELLANEOUS) ×1 IMPLANT
DRAPE SURG 17X23 STRL (DRAPES) ×4 IMPLANT
DRSG OPSITE POSTOP 4X6 (GAUZE/BANDAGES/DRESSINGS) IMPLANT
ELECT BLADE 4.0 EZ CLEAN MEGAD (MISCELLANEOUS) ×1
ELECT REM PT RETURN 9FT ADLT (ELECTROSURGICAL) ×1
ELECTRODE BLDE 4.0 EZ CLN MEGD (MISCELLANEOUS) ×1 IMPLANT
ELECTRODE REM PT RTRN 9FT ADLT (ELECTROSURGICAL) ×1 IMPLANT
GAUZE 4X4 16PLY ~~LOC~~+RFID DBL (SPONGE) IMPLANT
GAUZE SPONGE 4X4 12PLY STRL (GAUZE/BANDAGES/DRESSINGS) ×1 IMPLANT
GLOVE BIO SURGEON STRL SZ8 (GLOVE) ×1 IMPLANT
GLOVE BIO SURGEON STRL SZ8.5 (GLOVE) ×1 IMPLANT
GLOVE EXAM NITRILE XL STR (GLOVE) IMPLANT
GOWN STRL REUS W/ TWL LRG LVL3 (GOWN DISPOSABLE) IMPLANT
GOWN STRL REUS W/ TWL XL LVL3 (GOWN DISPOSABLE) ×1 IMPLANT
GOWN STRL REUS W/TWL 2XL LVL3 (GOWN DISPOSABLE) IMPLANT
GOWN STRL REUS W/TWL LRG LVL3 (GOWN DISPOSABLE)
GOWN STRL REUS W/TWL XL LVL3 (GOWN DISPOSABLE) ×1
HEMOSTAT POWDER SURGIFOAM 1G (HEMOSTASIS) IMPLANT
KIT BASIN OR (CUSTOM PROCEDURE TRAY) ×1 IMPLANT
KIT TURNOVER KIT B (KITS) ×1 IMPLANT
NDL HYPO 21X1.5 SAFETY (NEEDLE) IMPLANT
NEEDLE HYPO 21X1.5 SAFETY (NEEDLE) IMPLANT
NEEDLE HYPO 22GX1.5 SAFETY (NEEDLE) ×1 IMPLANT
NS IRRIG 1000ML POUR BTL (IV SOLUTION) ×1 IMPLANT
PACK LAMINECTOMY NEURO (CUSTOM PROCEDURE TRAY) ×1 IMPLANT
PAD ARMBOARD 7.5X6 YLW CONV (MISCELLANEOUS) ×3 IMPLANT
PATTIES SURGICAL .5 X1 (DISPOSABLE) IMPLANT
SPONGE SURGIFOAM ABS GEL SZ50 (HEMOSTASIS) ×1 IMPLANT
STRIP CLOSURE SKIN 1/2X4 (GAUZE/BANDAGES/DRESSINGS) ×1 IMPLANT
SUT VIC AB 1 CT1 18XBRD ANBCTR (SUTURE) ×2 IMPLANT
SUT VIC AB 1 CT1 8-18 (SUTURE) ×1
SUT VIC AB 2-0 CP2 18 (SUTURE) ×2 IMPLANT
TOWEL GREEN STERILE (TOWEL DISPOSABLE) ×1 IMPLANT
TOWEL GREEN STERILE FF (TOWEL DISPOSABLE) ×1 IMPLANT
WATER STERILE IRR 1000ML POUR (IV SOLUTION) ×1 IMPLANT

## 2021-11-02 NOTE — Progress Notes (Signed)
Subjective: The patient is alert.  She is in no apparent distress.  Objective: Vital signs in last 24 hours: Temp:  [97.7 F (36.5 C)-98.3 F (36.8 C)] 97.7 F (36.5 C) (10/09 1010) Pulse Rate:  [75] 75 (10/09 0601) Resp:  [16] 16 (10/09 0601) BP: (169)/(68) 169/68 (10/09 0601) SpO2:  [97 %] 97 % (10/09 0601) Weight:  [49 kg] 49 kg (10/09 0601) Estimated body mass index is 21.81 kg/m as calculated from the following:   Height as of this encounter: '4\' 11"'$  (1.499 m).   Weight as of this encounter: 49 kg.   Intake/Output from previous day: No intake/output data recorded. Intake/Output this shift: Total I/O In: 800 [I.V.:800] Out: 50 [Blood:50]  Physical exam the patient is alert.  Her lower extremity strength is grossly normal.  Lab Results: No results for input(s): "WBC", "HGB", "HCT", "PLT" in the last 72 hours. BMET No results for input(s): "NA", "K", "CL", "CO2", "GLUCOSE", "BUN", "CREATININE", "CALCIUM" in the last 72 hours.  Studies/Results: DG Lumbar Spine 2-3 Views  Result Date: 11/02/2021 CLINICAL DATA:  Provided history: Elective surgery. Localization for T12-L1, L1-L2 lam, foram EXAM: LUMBAR SPINE - 2-3 VIEW COMPARISON:  Spine radiographs 01/07/2021. FINDINGS: Two lateral view radiographs of the lumbar and lower thoracic spine are submitted. Five lumbar vertebrae. The caudal most well-formed intervertebral disc space is designated L5-S1. Redemonstrated posterior spinal fusion construct spanning the L2-L5 levels. Interbody devices are present at L2-L3, L3-L4 and L4-L5. On the image acquired at 4:70 am, a metallic surgical instrument and retractors project posterior to the spine at the T12-L1 level. Surgical clamps project posterior to the lumbar spine. IMPRESSION: Two lateral view intraoperative radiographs of the lumbar and lower thoracic spine. On the most recent provided image, a metallic surgical instrument and retractors project posterior to the spine at the T12-L1  level. Correlate with the operative history. Electronically Signed   By: Kellie Simmering D.O.   On: 11/02/2021 10:05    Assessment/Plan: The patient is doing well.  I spoke with her daughter.  LOS: 0 days     Ophelia Charter 11/02/2021, 10:49 AM

## 2021-11-02 NOTE — Op Note (Signed)
Brief history: The patient is a 86 year old white female whose had a previous lumbar fusion.  She has developed recurrent back and leg pain and weakness.  She failed medical management and was worked up with a lumbar MRI which demonstrated the patient had severe spinal stenosis at T12-L1 and lateral recess stenosis at L1-2.  I discussed the various treatment options with including surgery.  She has decided proceed with surgery.  Preoperative diagnosis: T12-L1 and L1-2 spinal stenosis, thoracic myelopathy, lumbar radiculopathy, neurogenic claudication  Postoperative diagnosis: As above  Procedure: Bilateral T12-L1 laminotomy/foraminotomies, left L1-2 laminotomy foraminotomy using micro-dissection  Surgeon: Dr. Earle Gell  Asst.: Arnetha Massy, NP  Anesthesia: Gen. endotracheal  Estimated blood loss: 75 cc  Drains: None  Complications: None  Description of procedure: The patient was brought to the operating room by the anesthesia team. General endotracheal anesthesia was induced. The patient was turned to the prone position on the Wilson frame. The patient's lumbosacral region was then prepared with Betadine scrub and Betadine solution. Sterile drapes were applied.  I then injected the area to be incised with Marcaine with epinephrine solution. I then used a scalpel to make a linear midline incision over the T12-L1 and L1 to intervertebral disc space. I then used electrocautery to perform a bilateral subperiosteal dissection exposing the spinous process and lamina of T12-L1 and L1-2. We obtained intraoperative radiograph to confirm our location. I then inserted the [facility] retractor for exposure.  We then brought the operative microscope into the field. Under its magnification and illumination we completed the microdissection. I used a high-speed drill to perform a laminotomy at T12-L1 bilaterally and L1-2 on the left . I then used a Kerrison punches to widen the laminotomy and removed the  ligamentum flavum at T12 and L1 bilaterally and L1-2 on the left. We then used microdissection to free up the thecal sac  from the epidural tissue. I then used a Kerrison punch to widen the laminotomy at T12-L1 bilaterally and L1-2 on the left decompressing the thecal sac.   We then obtained hemostasis using bipolar electrocautery. We irrigated the wound out with saline solution. We then removed the retractor. We then reapproximated the patient's thoracolumbar fascia with interrupted #1 Vicryl suture. We then reapproximated the patient's subcutaneous tissue with interrupted 2-0 Vicryl suture. We then reapproximated patient's skin with Steri-Strips and benzoin. The was then coated with bacitracin ointment. The drapes were removed. The patient was subsequently returned to the supine position where they were extubated by the anesthesia team. The patient was then transported to the postanesthesia care unit in stable condition. All sponge instrument and needle counts were reportedly correct at the end of this case.

## 2021-11-02 NOTE — H&P (Signed)
Subjective: The patient is an 86 year old white female who has had previous back surgeries including fusions.  She complains of back and bilateral leg pain consistent with neurogenic claudication.  She has failed medical management.  She was worked up with a lumbar MRI which demonstrated multilevel degenerative changes with stenosis most prominent at T12-L1 and L1-2.  I discussed the various treatment options with her.  She has decided proceed with surgery.  Past Medical History:  Diagnosis Date   Arthritis    Headache(784.0)    HTN (hypertension)    Osteoporosis    Raynaud disease     Past Surgical History:  Procedure Laterality Date   ABDOMINAL HYSTERECTOMY     BACK SURGERY     EYE SURGERY     Bilateral cataracts   TONSILLECTOMY     under chin cyst removed     WRIST GANGLION EXCISION     right hand    Allergies  Allergen Reactions   Penicillins Other (See Comments) and Rash    Unknown   PT STATES SHE  IS ALLERGIC TO SULFA AND SOMEONE TOLD HER IF YOU ARE ALLERGIC TO SULFA YOU ARE ALLERGIC TO PENICILLIN   Sulfa Antibiotics Rash   Sulfa Drugs Cross Reactors Rash    Social History   Tobacco Use   Smoking status: Never   Smokeless tobacco: Never  Substance Use Topics   Alcohol use: No    Family History  Problem Relation Age of Onset   Diabetes Mother    Arthritis Mother    Heart disease Father 49       Heart attack, blood clot to the leg   Stroke Father    Clotting disorder Son    Prior to Admission medications   Medication Sig Start Date End Date Taking? Authorizing Provider  acetaminophen (TYLENOL) 500 MG tablet Take 500 mg by mouth every 8 (eight) hours as needed for moderate pain.   Yes [provider]  estradiol (ESTRACE) 0.1 MG/GM vaginal cream Place 1 Applicatorful vaginally daily. 10/19/21  Yes [provider]  gabapentin (NEURONTIN) 300 MG capsule Take 300 mg by mouth 2 (two) times daily.   Yes [provider]   HYDROcodone-acetaminophen (NORCO/VICODIN) 5-325 MG tablet Take 1 tablet by mouth 3 (three) times daily as needed for pain. 02/22/21  Yes [provider]  Multiple Vitamin (MULTIVITAMIN ADULT PO) Take 1 tablet by mouth in the morning.   Yes [provider]  UNABLE TO FIND Med Name: Stool Softener PO   Yes [provider]  Vibegron (GEMTESA) 75 MG TABS Take 75 mg by mouth in the morning.   Yes [provider]  acetaminophen (TYLENOL) 325 MG tablet Take 650 mg by mouth every 6 (six) hours as needed for moderate pain or headache.    [provider]  cephALEXin (KEFLEX) 500 MG capsule Take 1 capsule (500 mg total) by mouth 4 (four) times daily. Patient not taking: Reported on 03/10/2021 12/21/20   Blanchie Dessert, MD  cephALEXin (KEFLEX) 500 MG capsule Take 1 capsule (500 mg total) by mouth 4 (four) times daily. Patient not taking: Reported on 10/23/2021 06/07/21   Palumbo, April, MD     Review of Systems  Positive ROS: As above  All other systems have been reviewed and were otherwise negative with the exception of those mentioned in the HPI and as above.  Objective: Vital signs in last 24 hours: Temp:  [98.3 F (36.8 C)] 98.3 F (36.8 C) (10/09 0601) Pulse  Rate:  [75] 75 (10/09 0601) Resp:  [16] 16 (10/09 0601) BP: (169)/(68) 169/68 (10/09 0601) SpO2:  [97 %] 97 % (10/09 0601) Weight:  [49 kg] 49 kg (10/09 0601) Estimated body mass index is 21.81 kg/m as calculated from the following:   Height as of this encounter: '4\' 11"'$  (1.499 m).   Weight as of this encounter: 49 kg.   General Appearance: Alert Head: Normocephalic, without obvious abnormality, atraumatic Eyes: PERRL, conjunctiva/corneas clear, EOM's intact,    Ears: Normal  Throat: Normal  Neck: Supple, Back: Her lumbar incision is well-healed. Lungs: Clear to auscultation bilaterally, respirations unlabored Heart: Regular rate and rhythm, no murmur, rub or gallop Abdomen: Soft,  non-tender Extremities: Extremities normal, atraumatic, no cyanosis or edema Skin: unremarkable  NEUROLOGIC:   Mental status: alert and oriented,Motor Exam - grossly normal Sensory Exam - grossly normal Reflexes:  Coordination - grossly normal Gait - grossly normal Balance - grossly normal Cranial Nerves: I: smell Not tested  II: visual acuity  OS: Normal  OD: Normal   II: visual fields Full to confrontation  II: pupils Equal, round, reactive to light  III,VII: ptosis None  III,IV,VI: extraocular muscles  Full ROM  V: mastication Normal  V: facial light touch sensation  Normal  V,VII: corneal reflex  Present  VII: facial muscle function - upper  Normal  VII: facial muscle function - lower Normal  VIII: hearing Not tested  IX: soft palate elevation  Normal  IX,X: gag reflex Present  XI: trapezius strength  5/5  XI: sternocleidomastoid strength 5/5  XI: neck flexion strength  5/5  XII: tongue strength  Normal    Data Review Lab Results  Component Value Date   WBC 5.9 10/27/2021   HGB 12.8 10/27/2021   HCT 39.4 10/27/2021   MCV 98.7 10/27/2021   PLT 226 10/27/2021   Lab Results  Component Value Date   NA 142 10/27/2021   K 4.7 10/27/2021   CL 104 10/27/2021   CO2 29 10/27/2021   BUN 19 10/27/2021   CREATININE 0.91 10/27/2021   GLUCOSE 97 10/27/2021   Lab Results  Component Value Date   INR 0.86 07/25/2012    Assessment/Plan: Lumbar spinal stenosis, lumbar radiculopathy, neurogenic claudication, lumbago: I have discussed the situation with the patient.  I reviewed her imaging studies with her and pointed out the abnormalities.  We have discussed the various treatment options including surgery.  I have described the surgical option of bilateral T12-L1 and L1-2 laminotomy/foraminotomies.  I have shown her surgical models.  I have given her a surgical pamphlet.  We have discussed the risk, benefits, alternatives, expected postop course, and likelihood of achieving  her goals with surgery.  I have answered all her questions.  She has decided proceed with surgery.   Ophelia Charter 11/02/2021 7:31 AM

## 2021-11-02 NOTE — Transfer of Care (Signed)
Immediate Anesthesia Transfer of Care Note  Patient: Danielle Hernandez  Procedure(s) Performed: T12-L1, L1-L2 LAM,FORAM (Bilateral)  Patient Location: PACU  Anesthesia Type:General  Level of Consciousness: awake, alert  and oriented  Airway & Oxygen Therapy: Patient Spontanous Breathing and Patient connected to face mask oxygen  Post-op Assessment: Report given to RN and Post -op Vital signs reviewed and stable  Post vital signs: Reviewed and stable  Last Vitals:  Vitals Value Taken Time  BP 129/60 11/02/21 1009  Temp    Pulse 84 11/02/21 1012  Resp 15 11/02/21 1012  SpO2 99 % 11/02/21 1012  Vitals shown include unvalidated device data.  Last Pain:  Vitals:   11/02/21 0601  TempSrc: Oral  PainSc: 8          Complications: No notable events documented.

## 2021-11-02 NOTE — Anesthesia Preprocedure Evaluation (Addendum)
Anesthesia Evaluation  Patient identified by MRN, date of birth, ID band Patient awake    Reviewed: Allergy & Precautions, NPO status , Patient's Chart, lab work & pertinent test results  Airway Mallampati: II  TM Distance: >3 FB Neck ROM: Full    Dental  (+) Edentulous Upper, Edentulous Lower   Pulmonary neg pulmonary ROS   Pulmonary exam normal        Cardiovascular hypertension,  Rhythm:Regular Rate:Normal     Neuro/Psych  Headaches  negative psych ROS   GI/Hepatic negative GI ROS, Neg liver ROS,,,  Endo/Other  negative endocrine ROS    Renal/GU negative Renal ROS  negative genitourinary   Musculoskeletal  (+) Arthritis ,    Abdominal Normal abdominal exam  (+)   Peds  Hematology negative hematology ROS (+)   Anesthesia Other Findings   Reproductive/Obstetrics                             Anesthesia Physical Anesthesia Plan  ASA: 3  Anesthesia Plan: General   Post-op Pain Management: Tylenol PO (pre-op)*   Induction: Intravenous  PONV Risk Score and Plan: 3 and Ondansetron, Dexamethasone and Treatment may vary due to age or medical condition  Airway Management Planned: Mask and Oral ETT  Additional Equipment: None  Intra-op Plan:   Post-operative Plan: Extubation in OR  Informed Consent: I have reviewed the patients History and Physical, chart, labs and discussed the procedure including the risks, benefits and alternatives for the proposed anesthesia with the patient or authorized representative who has indicated his/her understanding and acceptance.     Dental advisory given  Plan Discussed with: CRNA  Anesthesia Plan Comments:        Anesthesia Quick Evaluation

## 2021-11-02 NOTE — Anesthesia Postprocedure Evaluation (Signed)
Anesthesia Post Note  Patient: Danielle Hernandez  Procedure(s) Performed: T12-L1, L1-L2 LAM,FORAM (Bilateral)     Patient location during evaluation: PACU Anesthesia Type: General Level of consciousness: awake and alert Pain management: pain level controlled Vital Signs Assessment: post-procedure vital signs reviewed and stable Respiratory status: spontaneous breathing, nonlabored ventilation, respiratory function stable and patient connected to nasal cannula oxygen Cardiovascular status: blood pressure returned to baseline and stable Postop Assessment: no apparent nausea or vomiting Anesthetic complications: no   No notable events documented.  Last Vitals:  Vitals:   11/02/21 1128 11/02/21 1622  BP: (!) 154/67 (!) 160/74  Pulse: 88 83  Resp:  16  Temp: 36.7 C 37 C  SpO2: 98% 95%    Last Pain:  Vitals:   11/02/21 1622  TempSrc: Oral  PainSc:                  March Rummage Ramanda Paules

## 2021-11-02 NOTE — Anesthesia Procedure Notes (Signed)
Procedure Name: Intubation Date/Time: 11/02/2021 7:53 AM  Performed by: Dorthea Cove, CRNAPre-anesthesia Checklist: Patient identified, Emergency Drugs available, Suction available and Patient being monitored Patient Re-evaluated:Patient Re-evaluated prior to induction Oxygen Delivery Method: Circle system utilized Preoxygenation: Pre-oxygenation with 100% oxygen Induction Type: IV induction Ventilation: Mask ventilation without difficulty and Oral airway inserted - appropriate to patient size Laryngoscope Size: Mac and 3 Grade View: Grade I Tube type: Oral Tube size: 7.0 mm Number of attempts: 1 Airway Equipment and Method: Stylet and Oral airway Placement Confirmation: ETT inserted through vocal cords under direct vision, positive ETCO2 and breath sounds checked- equal and bilateral Secured at: 21 cm Tube secured with: Tape Dental Injury: Teeth and Oropharynx as per pre-operative assessment

## 2021-11-03 ENCOUNTER — Encounter (HOSPITAL_COMMUNITY): Payer: Self-pay | Admitting: Neurosurgery

## 2021-11-03 DIAGNOSIS — M48062 Spinal stenosis, lumbar region with neurogenic claudication: Secondary | ICD-10-CM | POA: Diagnosis not present

## 2021-11-03 DIAGNOSIS — M4804 Spinal stenosis, thoracic region: Secondary | ICD-10-CM | POA: Diagnosis not present

## 2021-11-03 DIAGNOSIS — M5416 Radiculopathy, lumbar region: Secondary | ICD-10-CM | POA: Diagnosis not present

## 2021-11-03 DIAGNOSIS — G992 Myelopathy in diseases classified elsewhere: Secondary | ICD-10-CM | POA: Diagnosis not present

## 2021-11-03 DIAGNOSIS — I10 Essential (primary) hypertension: Secondary | ICD-10-CM | POA: Diagnosis not present

## 2021-11-03 MED ORDER — DOCUSATE SODIUM 100 MG PO CAPS: 100.0000 mg | ORAL_CAPSULE | Freq: Two times a day (BID) | ORAL | 0 refills | Status: AC

## 2021-11-03 MED ORDER — OXYCODONE-ACETAMINOPHEN 5-325 MG PO TABS: 1.0000 | ORAL_TABLET | ORAL | 0 refills | Status: AC | PRN

## 2021-11-03 MED ORDER — OXYCODONE-ACETAMINOPHEN 5-325 MG PO TABS
1.0000 | ORAL_TABLET | ORAL | Status: DC | PRN
  Filled 2021-11-03: qty 1

## 2021-11-03 NOTE — Progress Notes (Signed)
PT Cancellation Note and Discharge  Patient Details Name: Danielle Hernandez MRN: [id number] DOB: August 21, 1935   Cancelled Treatment:    Reason Eval/Treat Not Completed: PT screened, no needs identified, will sign off. Discussed pt case with OT who reports pt is currently mobilizing without assistance and does not require a formal PT evaluation at this time. PT signing off. If needs change, please reconsult.     Thelma Comp 11/03/2021, 8:55 AM  Rolinda Roan, PT, DPT Acute Rehabilitation Services Secure Chat Preferred Office: [phone]

## 2021-11-03 NOTE — Plan of Care (Signed)

## 2021-11-03 NOTE — Discharge Summary (Signed)
Physician Discharge Summary  Patient ID: Danielle Hernandez MRN: [id number] DOB/AGE: Feb 17, 1935 86 y.o.  Admit date: 11/02/2021 Discharge date: 11/03/2021  Admission Diagnoses: Thoracic spinal stenosis, thoracic myelopathy, lumbar spinal stenosis, neurogenic claudication,  Discharge Diagnoses: The same Principal Problem:   Myelopathy concurrent with and due to spinal stenosis of thoracic region [facility])   Discharged Condition: good  Hospital Course: I performed bilateral T12-L1 and L1-2 laminotomy/foraminotomies on the patient on 11/02/2021.  The surgery went well.  The patient's postoperative course was unremarkable.  On postoperative day #1 the patient felt well and requested discharge to home.  The patient, and her daughter, were given written and verbal discharge instructions.  All their questions were answered.  Consults: PT, OT, care management Significant Diagnostic Studies: None Treatments: Bilateral T12-L1 laminotomy/foraminotomies, left L1-2 laminotomy/foraminotomies using microdissection. Discharge Exam: Blood pressure (!) 135/52, pulse 81, temperature 98.4 F (36.9 C), temperature source Oral, resp. rate 18, height '4\' 11"'$  (1.499 m), weight 49 kg, SpO2 95 %. Patient is alert and pleasant.  Her strength is normal.  She looks well.  Her dressing has a small bloodstain.  Disposition: Home  Discharge Instructions     Call MD for:  difficulty breathing, headache or visual disturbances   Complete by: As directed    Call MD for:  extreme fatigue   Complete by: As directed    Call MD for:  hives   Complete by: As directed    Call MD for:  persistant dizziness or light-headedness   Complete by: As directed    Call MD for:  persistant nausea and vomiting   Complete by: As directed    Call MD for:  redness, tenderness, or signs of infection (pain, swelling, redness, odor or green/yellow discharge around incision site)   Complete by: As directed    Call MD for:  severe uncontrolled  pain   Complete by: As directed    Call MD for:  temperature >100.4   Complete by: As directed    Diet - low sodium heart healthy   Complete by: As directed    Discharge instructions   Complete by: As directed    Call [phone] for a followup appointment. Take a stool softener while you are using pain medications.   Driving Restrictions   Complete by: As directed    Do not drive for 2 weeks.   Increase activity slowly   Complete by: As directed    Lifting restrictions   Complete by: As directed    Do not lift more than 5 pounds. No excessive bending or twisting.   May shower / Bathe   Complete by: As directed    Remove the dressing for 3 days after surgery.  You may shower, but leave the incision alone.   Remove dressing in 48 hours   Complete by: As directed       Allergies as of 11/03/2021       Reactions   Penicillins Other (See Comments), Rash   Unknown   PT STATES SHE  IS ALLERGIC TO SULFA AND SOMEONE TOLD HER IF YOU ARE ALLERGIC TO SULFA YOU ARE ALLERGIC TO PENICILLIN   Sulfa Antibiotics Rash   Sulfa Drugs Cross Reactors Rash        Medication List     STOP taking these medications    HYDROcodone-acetaminophen 5-325 MG tablet Commonly known as: NORCO/VICODIN   UNABLE TO FIND       TAKE these medications    acetaminophen 500 MG tablet  Commonly known as: TYLENOL Take 500 mg by mouth every 8 (eight) hours as needed for moderate pain. What changed: Another medication with the same name was removed. Continue taking this medication, and follow the directions you see here.   cephALEXin 500 MG capsule Commonly known as: KEFLEX Take 1 capsule (500 mg total) by mouth 4 (four) times daily.   cephALEXin 500 MG capsule Commonly known as: KEFLEX Take 1 capsule (500 mg total) by mouth 4 (four) times daily.   docusate sodium 100 MG capsule Commonly known as: COLACE Take 1 capsule (100 mg total) by mouth 2 (two) times daily.   estradiol 0.1 MG/GM vaginal  cream Commonly known as: ESTRACE Place 1 Applicatorful vaginally daily.   gabapentin 300 MG capsule Commonly known as: NEURONTIN Take 300 mg by mouth 2 (two) times daily.   Gemtesa 75 MG Tabs Generic drug: Vibegron Take 75 mg by mouth in the morning.   MULTIVITAMIN ADULT PO Take 1 tablet by mouth in the morning.   oxyCODONE-acetaminophen 5-325 MG tablet Commonly known as: PERCOCET/ROXICET Take 1 tablet by mouth every 4 (four) hours as needed for moderate pain.         Signed: Ophelia Charter 11/03/2021, 7:45 AM

## 2021-11-03 NOTE — Progress Notes (Signed)
Occupational Therapy Treatment Patient Details Name: Danielle Hernandez MRN: [id number] DOB: [birth year] Today's Date: 11/03/2021   History of present illness 86 yo female s/p T12-L1 Lami on 10/9. PMH including arthritis, HTN, back sx, Raynaud disease.   OT comments  Returning for second visit per daughter's request to review socks and shoes management. Pt able to don socks at EOB with using modified figure four method while sitting at EOB. Requiring increased time. Presented sock aide but pt declining use. Reviewed education with pt and daughter. Continue to recommend dc to home.    Recommendations for follow up therapy are one component of a multi-disciplinary discharge planning process, led by the attending physician.  Recommendations may be updated based on patient status, additional functional criteria and insurance authorization.    Follow Up Recommendations  No OT follow up    Assistance Recommended at Discharge Frequent or constant Supervision/Assistance  Patient can return home with the following  Assistance with cooking/housework;Assist for transportation   Equipment Recommendations  None recommended by OT    Recommendations for Other Services      Precautions / Restrictions Precautions Precautions: Back Precaution Booklet Issued: Yes (comment) Precaution Comments: Reviewed handout Required Braces or Orthoses: Other Brace Other Brace: no brace per MD Restrictions Weight Bearing Restrictions: No       Mobility Bed Mobility Overal bed mobility: Needs Assistance Bed Mobility: Rolling, Sit to Sidelying Rolling: Supervision       Sit to sidelying: Supervision General bed mobility comments: Cues for log roll    Transfers Overall transfer level: Needs assistance Equipment used: Rolling walker (2 wheels) Transfers: Sit to/from Stand Sit to Stand: Supervision           General transfer comment: safety     Balance Overall balance assessment: No apparent balance  deficits (not formally assessed) (Benefiting from RW for pain management and energy conservation)                                         ADL either performed or assessed with clinical judgement   ADL Overall ADL's : Needs assistance/impaired                                       General ADL Comments: Returning for second visit per daughter's request to review socks and shoes management. Pt able to don socks at EOB with using modified figure four method. Requiring increased time. presenitng sock aide but pt declining    Extremity/Trunk Assessment Upper Extremity Assessment Upper Extremity Assessment: Overall WFL for tasks assessed   Lower Extremity Assessment Lower Extremity Assessment: Overall WFL for tasks assessed   Cervical / Trunk Assessment Cervical / Trunk Assessment: Kyphotic;Back Surgery    Vision       Perception     Praxis      Cognition Arousal/Alertness: Awake/alert Behavior During Therapy: WFL for tasks assessed/performed Overall Cognitive Status: Within Functional Limits for tasks assessed                                          Exercises      Shoulder Instructions       General Comments Daughter present throughout    Pertinent  Vitals/ Pain       Pain Assessment Pain Assessment: Faces Faces Pain Scale: Hurts a little bit Pain Intervention(s): Monitored during session, Repositioned  Home Living Family/patient expects to be discharged to:: Private residence Living Arrangements: Alone Available Help at Discharge: Friend(s);Available 24 hours/day;Family Type of Home: House Home Access: Stairs to enter [employer] of Steps: 2 Entrance Stairs-Rails: Right;Left;Can reach both Home Layout: One level     Bathroom Shower/Tub: Teacher, early years/pre: Standard     Home Equipment: Toilet riser;Shower Land (2 wheels);Cane - single point;Adaptive  equipment Adaptive Equipment: Reacher        Prior Functioning/Environment              Frequency  Min 2X/week        Progress Toward Goals  OT Goals(current goals can now be found in the care plan section)  Progress towards OT goals: Progressing toward goals  Acute Rehab OT Goals Patient Stated Goal: Go home OT Goal Formulation: With patient Time For Goal Achievement: 11/17/21 Potential to Achieve Goals: Good ADL Goals Pt Will Perform Grooming: Independently;standing Pt Will Perform Lower Body Dressing: Independently;sit to/from stand;sitting/lateral leans Pt Will Transfer to Toilet: Independently;anterior/posterior transfer;ambulating Additional ADL Goal #1: Pt will verbalize 3/3 back precautions  Plan Discharge plan remains appropriate    Co-evaluation                 AM-PAC OT "6 Clicks" Daily Activity     Outcome Measure   Help from another person eating meals?: None Help from another person taking care of personal grooming?: A Little Help from another person toileting, which includes using toliet, bedpan, or urinal?: A Little Help from another person bathing (including washing, rinsing, drying)?: A Little Help from another person to put on and taking off regular upper body clothing?: A Little Help from another person to put on and taking off regular lower body clothing?: A Little 6 Click Score: 19    End of Session Equipment Utilized During Treatment: Rolling walker (2 wheels)  OT Visit Diagnosis: Unsteadiness on feet (R26.81);Other abnormalities of gait and mobility (R26.89);Muscle weakness (generalized) (M62.81);Pain   Activity Tolerance Patient tolerated treatment well   Patient Left in bed;with call bell/phone within reach;with family/visitor present   Nurse Communication Mobility status        Time: 0902-0911 OT Time Calculation (min): 9 min  Charges: OT General Charges $OT Visit: 1 Visit OT Treatments $Self Care/Home Management  : 8-22 mins  Tarick Parenteau MSOT, OTR/L Acute Rehab Office: [place] 11/03/2021, 1:58 PM

## 2021-11-03 NOTE — Evaluation (Signed)
Occupational Therapy Evaluation Patient Details Name: Danielle Hernandez MRN: [id number] DOB: 27-Apr-1935 Today's Date: 11/03/2021   History of Present Illness 86 yo female s/p T12-L1 Lami on 10/9. PMH including arthritis, HTN, back sx, Raynaud disease.   Clinical Impression   PTA, pt was living alone and was independent; pt's friends planning to stay with her at dc and daughter also available. Currently, pt requires Supervision for ADLs and functional mobility using RW. Provided education and handout on back precautions, bed mobility, grooming, LB ADLs, toileting, and tub transfer; pt demonstrated understanding. Answered all pt questions in preparation for DC. Recommend dc home once medically stable per physician. Will follow acutely as admitted to facilitate safe dc.    Recommendations for follow up therapy are one component of a multi-disciplinary discharge planning process, led by the attending physician.  Recommendations may be updated based on patient status, additional functional criteria and insurance authorization.   Follow Up Recommendations  No OT follow up    Assistance Recommended at Discharge Frequent or constant Supervision/Assistance  Patient can return home with the following Assistance with cooking/housework;Assist for transportation    Functional Status Assessment  Patient has had a recent decline in their functional status and demonstrates the ability to make significant improvements in function in a reasonable and predictable amount of time.  Equipment Recommendations  None recommended by OT    Recommendations for Other Services       Precautions / Restrictions Precautions Precautions: Back Precaution Booklet Issued: Yes (comment) Precaution Comments: Reviewed handout Required Braces or Orthoses: Other Brace Other Brace: no brace per MD Restrictions Weight Bearing Restrictions: No      Mobility Bed Mobility Overal bed mobility: Needs Assistance Bed Mobility:  Rolling, Sit to Sidelying Rolling: Supervision       Sit to sidelying: Supervision General bed mobility comments: Cues for log roll    Transfers Overall transfer level: Needs assistance Equipment used: Rolling walker (2 wheels) Transfers: Sit to/from Stand Sit to Stand: Supervision           General transfer comment: safety      Balance Overall balance assessment: No apparent balance deficits (not formally assessed) (Benefiting from RW for pain management and energy conservation)                                         ADL either performed or assessed with clinical judgement   ADL Overall ADL's : Needs assistance/impaired                                       General ADL Comments: Pt performing at Supervision level. Providing eduction on back precautions, bed mobility, grooming, toileting, UB ADLs, LB ADLs (using modified figure four), and tub transfer. Pt demonstrated understanding     Vision         Perception     Praxis      Pertinent Vitals/Pain Pain Assessment Pain Assessment: Faces Faces Pain Scale: Hurts a little bit Pain Intervention(s): Monitored during session     Hand Dominance Right   Extremity/Trunk Assessment Upper Extremity Assessment Upper Extremity Assessment: Overall WFL for tasks assessed   Lower Extremity Assessment Lower Extremity Assessment: Overall WFL for tasks assessed   Cervical / Trunk Assessment Cervical / Trunk Assessment: Kyphotic;Back Surgery   Communication Communication  Communication: No difficulties;HOH   Cognition Arousal/Alertness: Awake/alert Behavior During Therapy: WFL for tasks assessed/performed Overall Cognitive Status: Within Functional Limits for tasks assessed                                       General Comments  Daughter present throughout    Exercises     Shoulder Instructions      Home Living Family/patient expects to be discharged to::  Private residence Living Arrangements: Alone Available Help at Discharge: Friend(s);Available 24 hours/day;Family Type of Home: House Home Access: Stairs to enter [employer] of Steps: 2 Entrance Stairs-Rails: Right;Left;Can reach both Home Layout: One level     Bathroom Shower/Tub: Teacher, early years/pre: Standard     Home Equipment: Toilet riser;Shower Land (2 wheels);Cane - single point;Adaptive equipment Adaptive Equipment: Reacher        Prior Functioning/Environment Prior Level of Function : Independent/Modified Independent               ADLs Comments: ADLs and light IADLs; daughter helping with some IADLs        OT Problem List: Decreased strength;Decreased range of motion;Decreased activity tolerance;Impaired balance (sitting and/or standing);Decreased knowledge of use of DME or AE;Decreased knowledge of precautions;Pain      OT Treatment/Interventions: Self-care/ADL training;Therapeutic exercise;Energy conservation;DME and/or AE instruction;Therapeutic activities;Patient/family education;Balance training    OT Goals(Current goals can be found in the care plan section) Acute Rehab OT Goals Patient Stated Goal: Go home OT Goal Formulation: With patient Time For Goal Achievement: 11/17/21 Potential to Achieve Goals: Good  OT Frequency: Min 2X/week    Co-evaluation              AM-PAC OT "6 Clicks" Daily Activity     Outcome Measure Help from another person eating meals?: None Help from another person taking care of personal grooming?: A Little Help from another person toileting, which includes using toliet, bedpan, or urinal?: A Little Help from another person bathing (including washing, rinsing, drying)?: A Little Help from another person to put on and taking off regular upper body clothing?: A Little Help from another person to put on and taking off regular lower body clothing?: A Little 6 Click Score: 19    End of Session Equipment Utilized During Treatment: Rolling walker (2 wheels) Nurse Communication: Mobility status  Activity Tolerance: Patient tolerated treatment well Patient left: in bed;with call bell/phone within reach;with family/visitor present  OT Visit Diagnosis: Unsteadiness on feet (R26.81);Other abnormalities of gait and mobility (R26.89);Muscle weakness (generalized) (M62.81);Pain                Time: 0174-9449 OT Time Calculation (min): 22 min Charges:  OT General Charges $OT Visit: 1 Visit OT Evaluation $OT Eval Low Complexity: 1 Low  Somer Trotter MSOT, OTR/L Acute Rehab Office: [place] 11/03/2021, 11:05 AM

## 2021-11-03 NOTE — Progress Notes (Signed)
Patient alert and oriented, VSS, pt void surgical site dry and intact no sign of infection. D/c instruction explain and given to the patient and daughter all questions answered. Pt. D/c home per order

## 2021-11-18 DIAGNOSIS — N3020 Other chronic cystitis without hematuria: Secondary | ICD-10-CM | POA: Diagnosis not present

## 2021-12-03 DIAGNOSIS — L245 Irritant contact dermatitis due to other chemical products: Secondary | ICD-10-CM | POA: Diagnosis not present

## 2021-12-14 DIAGNOSIS — M419 Scoliosis, unspecified: Secondary | ICD-10-CM | POA: Diagnosis not present

## 2021-12-14 DIAGNOSIS — M5416 Radiculopathy, lumbar region: Secondary | ICD-10-CM | POA: Diagnosis not present

## 2021-12-14 DIAGNOSIS — M48062 Spinal stenosis, lumbar region with neurogenic claudication: Secondary | ICD-10-CM | POA: Diagnosis not present

## 2022-01-05 DIAGNOSIS — R350 Frequency of micturition: Secondary | ICD-10-CM | POA: Diagnosis not present

## 2022-01-05 DIAGNOSIS — N3020 Other chronic cystitis without hematuria: Secondary | ICD-10-CM | POA: Diagnosis not present

## 2022-01-21 DIAGNOSIS — H9212 Otorrhea, left ear: Secondary | ICD-10-CM | POA: Diagnosis not present

## 2022-01-21 DIAGNOSIS — H6122 Impacted cerumen, left ear: Secondary | ICD-10-CM | POA: Diagnosis not present

## 2022-02-23 DIAGNOSIS — M5416 Radiculopathy, lumbar region: Secondary | ICD-10-CM | POA: Diagnosis not present

## 2022-02-23 DIAGNOSIS — M48062 Spinal stenosis, lumbar region with neurogenic claudication: Secondary | ICD-10-CM | POA: Diagnosis not present

## 2022-03-01 DIAGNOSIS — M797 Fibromyalgia: Secondary | ICD-10-CM | POA: Diagnosis not present

## 2022-03-01 DIAGNOSIS — M549 Dorsalgia, unspecified: Secondary | ICD-10-CM | POA: Diagnosis not present

## 2022-03-01 DIAGNOSIS — M810 Age-related osteoporosis without current pathological fracture: Secondary | ICD-10-CM | POA: Diagnosis not present

## 2022-03-01 DIAGNOSIS — Z79899 Other long term (current) drug therapy: Secondary | ICD-10-CM | POA: Diagnosis not present

## 2022-03-01 DIAGNOSIS — Z0000 Encounter for general adult medical examination without abnormal findings: Secondary | ICD-10-CM | POA: Diagnosis not present

## 2022-03-01 DIAGNOSIS — K5790 Diverticulosis of intestine, part unspecified, without perforation or abscess without bleeding: Secondary | ICD-10-CM | POA: Diagnosis not present

## 2022-03-01 DIAGNOSIS — N816 Rectocele: Secondary | ICD-10-CM | POA: Diagnosis not present

## 2022-03-01 DIAGNOSIS — N3281 Overactive bladder: Secondary | ICD-10-CM | POA: Diagnosis not present

## 2022-03-01 DIAGNOSIS — J309 Allergic rhinitis, unspecified: Secondary | ICD-10-CM | POA: Diagnosis not present

## 2022-03-02 DIAGNOSIS — M48062 Spinal stenosis, lumbar region with neurogenic claudication: Secondary | ICD-10-CM | POA: Diagnosis not present

## 2022-03-04 DIAGNOSIS — R319 Hematuria, unspecified: Secondary | ICD-10-CM | POA: Diagnosis not present

## 2022-03-04 DIAGNOSIS — R300 Dysuria: Secondary | ICD-10-CM | POA: Diagnosis not present

## 2022-03-09 ENCOUNTER — Other Ambulatory Visit: Payer: Self-pay | Admitting: Obstetrics and Gynecology

## 2022-03-09 DIAGNOSIS — R102 Pelvic and perineal pain: Secondary | ICD-10-CM

## 2022-03-09 DIAGNOSIS — Z1231 Encounter for screening mammogram for malignant neoplasm of breast: Secondary | ICD-10-CM | POA: Diagnosis not present

## 2022-03-12 DIAGNOSIS — R922 Inconclusive mammogram: Secondary | ICD-10-CM | POA: Diagnosis not present

## 2022-03-12 DIAGNOSIS — R928 Other abnormal and inconclusive findings on diagnostic imaging of breast: Secondary | ICD-10-CM | POA: Diagnosis not present

## 2022-03-12 DIAGNOSIS — N6311 Unspecified lump in the right breast, upper outer quadrant: Secondary | ICD-10-CM | POA: Diagnosis not present

## 2022-03-15 ENCOUNTER — Ambulatory Visit
Admission: RE | Admit: 2022-03-15 | Discharge: 2022-03-15 | Disposition: A | Discharge status: Home / Self Care | Payer: Medicare Other | Source: Ambulatory Visit | Attending: Obstetrics and Gynecology | Admitting: Obstetrics and Gynecology

## 2022-03-15 DIAGNOSIS — K5730 Diverticulosis of large intestine without perforation or abscess without bleeding: Secondary | ICD-10-CM | POA: Diagnosis not present

## 2022-03-15 DIAGNOSIS — N3289 Other specified disorders of bladder: Secondary | ICD-10-CM | POA: Diagnosis not present

## 2022-03-15 DIAGNOSIS — R300 Dysuria: Secondary | ICD-10-CM | POA: Diagnosis not present

## 2022-03-15 DIAGNOSIS — R102 Pelvic and perineal pain: Secondary | ICD-10-CM

## 2022-03-15 DIAGNOSIS — R350 Frequency of micturition: Secondary | ICD-10-CM | POA: Diagnosis not present

## 2022-03-15 MED ORDER — IOPAMIDOL (ISOVUE-300) INJECTION 61%
100.0000 mL | Freq: Once | INTRAVENOUS | Status: AC | PRN
  Administered 2022-03-15: 100 mL via INTRAVENOUS

## 2022-03-23 DIAGNOSIS — R8279 Other abnormal findings on microbiological examination of urine: Secondary | ICD-10-CM | POA: Diagnosis not present

## 2022-03-23 DIAGNOSIS — N3020 Other chronic cystitis without hematuria: Secondary | ICD-10-CM | POA: Diagnosis not present

## 2022-04-14 DIAGNOSIS — H608X2 Other otitis externa, left ear: Secondary | ICD-10-CM | POA: Diagnosis not present

## 2022-04-14 DIAGNOSIS — N3020 Other chronic cystitis without hematuria: Secondary | ICD-10-CM | POA: Diagnosis not present

## 2022-04-14 DIAGNOSIS — H6122 Impacted cerumen, left ear: Secondary | ICD-10-CM | POA: Diagnosis not present

## 2022-04-14 DIAGNOSIS — R350 Frequency of micturition: Secondary | ICD-10-CM | POA: Diagnosis not present

## 2022-05-17 DIAGNOSIS — R300 Dysuria: Secondary | ICD-10-CM | POA: Diagnosis not present

## 2022-05-20 DIAGNOSIS — M48062 Spinal stenosis, lumbar region with neurogenic claudication: Secondary | ICD-10-CM | POA: Diagnosis not present

## 2022-05-21 DIAGNOSIS — N3020 Other chronic cystitis without hematuria: Secondary | ICD-10-CM | POA: Diagnosis not present

## 2022-05-21 DIAGNOSIS — R8279 Other abnormal findings on microbiological examination of urine: Secondary | ICD-10-CM | POA: Diagnosis not present

## 2022-06-09 NOTE — Therapy (Unsigned)
OUTPATIENT PHYSICAL THERAPY THORACOLUMBAR EVALUATION   Patient Name: Danielle Hernandez MRN: [id number] DOB:[birth year], 87 y.o., female Today's Date: 06/10/2022  END OF SESSION:  PT End of Session - 06/10/22 1051     Visit Number 1    Number of Visits 12    Date for PT Re-Evaluation 07/22/22    PT Start Time 1100    PT Stop Time 1145    PT Time Calculation (min) 45 min    Activity Tolerance Patient tolerated treatment well    Behavior During Therapy WFL for tasks assessed/performed             Past Medical History:  Diagnosis Date   Arthritis    Headache(784.0)    HTN (hypertension)    Osteoporosis    Raynaud disease    Past Surgical History:  Procedure Laterality Date   ABDOMINAL HYSTERECTOMY     BACK SURGERY     EYE SURGERY     Bilateral cataracts   LUMBAR LAMINECTOMY/DECOMPRESSION MICRODISCECTOMY Bilateral 11/02/2021   Procedure: T12-L1, L1-L2 LAM,FORAM;  Surgeon: Tressie Stalker, MD;  Location: [facility] OR;  Service: Neurosurgery;  Laterality: Bilateral;  3C   TONSILLECTOMY     under chin cyst removed     WRIST GANGLION EXCISION     right hand   Patient Active Problem List   Diagnosis Date Noted   Myelopathy concurrent with and due to spinal stenosis of thoracic region [facility]) 11/02/2021   CDNH (chondrodermatitis nodularis helicis), right 02/21/2018   Impacted cerumen of left ear 03/04/2016   Fecal incontinence 04/26/2012   Lumbar stenosis 02/08/2011    PCP: Dr. Lupita Raider  REFERRING PROVIDER: Tressie Stalker, MD  REFERRING DIAG: spinal stenosis s/p L1-L2 laminotomy /foraminotomy   Rationale for Evaluation and Treatment: Rehabilitation  THERAPY DIAG:  Difficulty in walking, not elsewhere classified  Muscle weakness (generalized)  Other low back pain  ONSET DATE: 10/2021  SUBJECTIVE:                                                                                                                                                                                            SUBJECTIVE STATEMENT: Pt arrives ambulating with cane, needed assistance to carry items and steady herself.  Pt has had back surgery (Oct. 2023) and Dr. Lovell Sheehan referred her to this clinic.  She states the surgery did help her pain.  She continues to have difficulty walking, she feels her balance is not good.  She stumbled and cut her leg about 2 weeks ago.  She continues to have incontinence (bladder) which is managed by Urology. She has back pain but it is  not as limited as her balance.  She wants to be able to walk without a cane.  She started using the cane about 2 mos.  She no longer shops because of the lack of confidence in her balance. She used to walk to her neighbors every day but has only done this x 1.    PERTINENT HISTORY:  Osteoporosis  Arthritis Raynaud HTN 11/02/21: T12-L1, L1-L2 Laminectomy, decompression, microdisc.  L shoulder fx and L wrist fx s/p falls  2013: 3 level PLIF  PAIN:  Are you having pain? Yes: NPRS scale: 4/10 Pain location: back , L leg mostly  Pain description: stiff, achy, weak Aggravating factors: getting out of bed  Relieving factors: sitting to do her housework   PRECAUTIONS: Fall  WEIGHT BEARING RESTRICTIONS: No  FALLS:  Has patient fallen in last 6 months? No and frequently catches her foot, stumbles  LIVING ENVIRONMENT: Lives with: lives alone Lives in: House/apartment Stairs: No 2 steps to enter with rails  Has following equipment at home: Single point cane, walker   OCCUPATION: not working   PLOF: Independent with basic ADLs and Leisure: has a close friend, daughter works in [employer], church   PATIENT GOALS: I want to walk without this cane   NEXT MD VISIT: unknown   OBJECTIVE:   DIAGNOSTIC FINDINGS:  None recent   PATIENT SURVEYS:  FOTO no time on eval  TBA  SCREENING FOR RED FLAGS: Bowel or bladder incontinence: Yes: premorbid Spinal tumors: No Cauda equina syndrome: No Compression fracture: No Abdominal  aneurysm: No  COGNITION: Overall cognitive status: Within functional limits for tasks assessed     SENSATION: WFL  MUSCLE LENGTH: Hamstrings: tight Thomas test: NT   POSTURE: rounded shoulders, forward head, and left pelvic obliquity High L hip , leans Rt   PALPATION: TTP Lt. lumbar   LUMBAR ROM:   AROM eval  Flexion deferred  Extension   Right lateral flexion   Left lateral flexion   Right rotation 25%  Left rotation 25%   (Blank rows = not tested)  LOWER EXTREMITY ROM:     Passive  Right eval Left  eval  Hip flexion WNL WNL   Hip extension    Hip abduction    Hip adduction    Hip internal rotation 15-20 deg 25 deg   Hip external rotation 25 deg  30 deg   Knee flexion WNL WNL   Knee extension    Ankle dorsiflexion stiff stiff  Ankle plantarflexion    Ankle inversion    Ankle eversion     (Blank rows = not tested)  LOWER EXTREMITY MMT:    MMT Right eval Left eval  Hip flexion 4 4+  Hip extension    Hip abduction    Hip adduction    Hip internal rotation    Hip external rotation    Knee flexion 4 4  Knee extension 4 4  Ankle dorsiflexion 4+ 3+  Ankle plantarflexion    Ankle inversion    Ankle eversion     (Blank rows = not tested)  LUMBAR SPECIAL TESTS:  NT   FUNCTIONAL TESTS:  5 times sit to stand: 10.3 30 seconds chair stand test 9 reps, max cues to avoid using back of legs on the mat   GAIT: Distance walked: 150 Assistive device utilized: Single point cane and without Level of assistance: Min A pt needs close supervision with cane, min A without  Comments: foot catches on the floor, min A  to recover balance  TODAY'S TREATMENT:                                                                                                                              DATE: 06/10/22    PATIENT EDUCATION:  Education details: PT, POC, HEP, sit to stand mechanics, balance, cane  Person educated: Patient Education method: Explanation, Demonstration,  Verbal cues, and Handouts Education comprehension: verbalized understanding and needs further education  HOME EXERCISE PROGRAM: Access Code: [email or web address] URL: [email or web address] Date: 06/10/2022 Prepared by: Karie Mainland  Exercises - Supine Lower Trunk Rotation  - 1 x daily - 7 x weekly - 2 sets - 10 reps - 10 hold - Hooklying Single Knee to Chest Stretch  - 1 x daily - 7 x weekly - 1 sets - 10 reps - 10 hold - Sit to Stand with Counter Support  - 3 x daily - 7 x weekly - 2 sets - 10 reps - 5 hold  ASSESSMENT:  CLINICAL IMPRESSION: Patient is a 87 y.o. female who was seen today for physical therapy evaluation and treatment for low back pain with balance impairments.   OBJECTIVE IMPAIRMENTS: Abnormal gait, decreased activity tolerance, decreased balance, decreased coordination, decreased knowledge of use of DME, decreased mobility, difficulty walking, decreased ROM, decreased strength, decreased safety awareness, increased fascial restrictions, improper body mechanics, postural dysfunction, and pain.   ACTIVITY LIMITATIONS: carrying, lifting, bending, standing, squatting, sleeping, stairs, transfers, bed mobility, and locomotion level  PARTICIPATION LIMITATIONS: meal prep, cleaning, laundry, shopping, community activity, and church  PERSONAL FACTORS: Age and 1-2 comorbidities: osteoporosis, precious back surgeries  are also affecting patient's functional outcome.   REHAB POTENTIAL: Good  CLINICAL DECISION MAKING: Evolving/moderate complexity  EVALUATION COMPLEXITY: Moderate   GOALS:   LONG TERM GOALS: Target date: 07/22/2022    Pt will be I with HEP Baseline: given basic on eval  Goal status: INITIAL  2.  Pt will complete balance testing and goal set Baseline:  Goal status: INITIAL  3.  Pt will be able to safely Sit to stand without compensations in 15 sec or less  Baseline:  Goal status: INITIAL  4.  Pt will be able to ambulate with cane without cues  and mod I , 300 feet while carrying handbag  Baseline:  Goal status: INITIAL  5. Pt will not be limited by pain in her back for home tasks  Baseline:  Goal status: INITIAL   PLAN:  PT FREQUENCY: 1-2x/week  PT DURATION: 6 weeks  PLANNED INTERVENTIONS: Therapeutic exercises, Therapeutic activity, Neuromuscular re-education, Balance training, Gait training, Patient/Family education, Self Care, Stair training, DME instructions, Cryotherapy, Moist heat, Manual therapy, and Re-evaluation.  PLAN FOR NEXT SESSION: FOTO, balance screen, check balance    Mireyah Chervenak, PT 06/10/2022, 1:03 PM   Karie Mainland, PT 06/10/22 1:33 PM Phone: [phone] Fax: [phone]

## 2022-06-10 ENCOUNTER — Ambulatory Visit: Payer: Medicare Other | Attending: Neurosurgery | Admitting: Physical Therapy

## 2022-06-10 DIAGNOSIS — M6281 Muscle weakness (generalized): Secondary | ICD-10-CM | POA: Insufficient documentation

## 2022-06-10 DIAGNOSIS — R2689 Other abnormalities of gait and mobility: Secondary | ICD-10-CM | POA: Diagnosis not present

## 2022-06-10 DIAGNOSIS — M5459 Other low back pain: Secondary | ICD-10-CM

## 2022-06-10 DIAGNOSIS — R262 Difficulty in walking, not elsewhere classified: Secondary | ICD-10-CM

## 2022-07-05 ENCOUNTER — Encounter: Payer: Self-pay | Admitting: Physical Therapy

## 2022-07-05 ENCOUNTER — Ambulatory Visit: Payer: Medicare Other | Attending: Neurosurgery | Admitting: Physical Therapy

## 2022-07-05 DIAGNOSIS — M6281 Muscle weakness (generalized): Secondary | ICD-10-CM | POA: Insufficient documentation

## 2022-07-05 DIAGNOSIS — R262 Difficulty in walking, not elsewhere classified: Secondary | ICD-10-CM | POA: Insufficient documentation

## 2022-07-05 DIAGNOSIS — M5459 Other low back pain: Secondary | ICD-10-CM | POA: Insufficient documentation

## 2022-07-05 DIAGNOSIS — R2689 Other abnormalities of gait and mobility: Secondary | ICD-10-CM | POA: Diagnosis not present

## 2022-07-05 DIAGNOSIS — M25552 Pain in left hip: Secondary | ICD-10-CM | POA: Diagnosis not present

## 2022-07-05 DIAGNOSIS — G8929 Other chronic pain: Secondary | ICD-10-CM | POA: Diagnosis not present

## 2022-07-05 DIAGNOSIS — M5442 Lumbago with sciatica, left side: Secondary | ICD-10-CM | POA: Diagnosis not present

## 2022-07-05 DIAGNOSIS — R293 Abnormal posture: Secondary | ICD-10-CM | POA: Diagnosis not present

## 2022-07-05 NOTE — Therapy (Signed)
OUTPATIENT PHYSICAL THERAPY TREATMENT NOTE   Patient Name: Danielle Hernandez MRN: [id number] DOB:[birth year], 87 y.o., female Today's Date: 07/05/2022  PCP: Dr. Lupita Raider   REFERRING PROVIDER: Tressie Stalker, MD  END OF SESSION:   PT End of Session - 07/05/22 1137     Visit Number 2    Number of Visits 12    Date for PT Re-Evaluation 07/22/22    PT Start Time 1145    PT Stop Time 1230    PT Time Calculation (min) 45 min             Past Medical History:  Diagnosis Date   Arthritis    Headache(784.0)    HTN (hypertension)    Osteoporosis    Raynaud disease    Past Surgical History:  Procedure Laterality Date   ABDOMINAL HYSTERECTOMY     BACK SURGERY     EYE SURGERY     Bilateral cataracts   LUMBAR LAMINECTOMY/DECOMPRESSION MICRODISCECTOMY Bilateral 11/02/2021   Procedure: T12-L1, L1-L2 LAM,FORAM;  Surgeon: Tressie Stalker, MD;  Location: [facility] OR;  Service: Neurosurgery;  Laterality: Bilateral;  3C   TONSILLECTOMY     under chin cyst removed     WRIST GANGLION EXCISION     right hand   Patient Active Problem List   Diagnosis Date Noted   Myelopathy concurrent with and due to spinal stenosis of thoracic region (HCC) 11/02/2021   CDNH (chondrodermatitis nodularis helicis), right 02/21/2018   Impacted cerumen of left ear 03/04/2016   Fecal incontinence 04/26/2012   Lumbar stenosis 02/08/2011    REFERRING DIAG: spinal stenosis s/p L1-L2 laminotomy /foraminotomy   THERAPY DIAG:  Difficulty in walking, not elsewhere classified  Muscle weakness (generalized)  Rationale for Evaluation and Treatment Rehabilitation  PERTINENT HISTORY:  Osteoporosis  Arthritis Raynaud HTN 11/02/21: T12-L1, L1-L2 Laminectomy, decompression, microdisc.  L shoulder fx and L wrist fx s/p falls  2013: 3 level PLIF   PRECAUTIONS: Fall  SUBJECTIVE:                                                                                                                                                                                       SUBJECTIVE STATEMENT:  I got a cane on Friday. Both ankles hurt at night. I did not sleep until after 3 am.    PAIN:  Are you having pain? Yes: NPRS scale: 6/10 Pain location: back , L leg mostly  Pain description: stiff, achy, weak Aggravating factors: getting out of bed  Relieving factors: sitting to do her housework    OBJECTIVE: (objective measures completed at initial evaluation unless otherwise dated)   DIAGNOSTIC FINDINGS:  None  recent    PATIENT SURVEYS:  FOTO 54% 07/05/22    SCREENING FOR RED FLAGS: Bowel or bladder incontinence: Yes: premorbid Spinal tumors: No Cauda equina syndrome: No Compression fracture: No Abdominal aneurysm: No   COGNITION: Overall cognitive status: Within functional limits for tasks assessed                          SENSATION: WFL   MUSCLE LENGTH: Hamstrings: tight Thomas test: NT    POSTURE: rounded shoulders, forward head, and left pelvic obliquity High L hip , leans Rt    PALPATION: TTP Lt. lumbar    LUMBAR ROM:    AROM eval  Flexion deferred  Extension    Right lateral flexion    Left lateral flexion    Right rotation 25%  Left rotation 25%   (Blank rows = not tested)   LOWER EXTREMITY ROM:      Passive  Right eval Left  eval  Hip flexion WNL WNL   Hip extension      Hip abduction      Hip adduction      Hip internal rotation 15-20 deg 25 deg   Hip external rotation 25 deg  30 deg   Knee flexion WNL WNL   Knee extension      Ankle dorsiflexion stiff stiff  Ankle plantarflexion      Ankle inversion      Ankle eversion       (Blank rows = not tested)   LOWER EXTREMITY MMT:     MMT Right eval Left eval  Hip flexion 4 4+  Hip extension      Hip abduction      Hip adduction      Hip internal rotation      Hip external rotation      Knee flexion 4 4  Knee extension 4 4  Ankle dorsiflexion 4+ 3+  Ankle plantarflexion      Ankle inversion      Ankle  eversion       (Blank rows = not tested)   LUMBAR SPECIAL TESTS:  NT    FUNCTIONAL TESTS:  5 times sit to stand: 10.3 30 seconds chair stand test 9 reps, max cues to avoid using back of legs on the mat  07/05/22: TUG 22.19 sec with SPC   GAIT: Distance walked: 150 Assistive device utilized: Single point cane and without Level of assistance: Min A pt needs close supervision with cane, min A without  Comments: foot catches on the floor, min A to recover balance   TODAY'S TREATMENT:                                                                                                                              [facility] Adult PT Treatment:  DATE: 07/05/22 Therapeutic Exercise: SKTC LTR Bridge 10 x 2  Supine clam red band 10 x 2  Therapeutic Activity: TUG STS x 10 -cues to reach for weight shifting FOTO Gait with SPC Gait in parallel bars using only right UE    DATE: 06/10/22      PATIENT EDUCATION:  Education details: PT, POC, HEP, sit to stand mechanics, balance, cane  Person educated: Patient Education method: Programmer, multimedia, Demonstration, Verbal cues, and Handouts Education comprehension: verbalized understanding and needs further education   HOME EXERCISE PROGRAM: Access Code: [email or web address] URL: [email or web address] Date: 06/10/2022 Prepared by: Karie Mainland   Exercises - Supine Lower Trunk Rotation  - 1 x daily - 7 x weekly - 2 sets - 10 reps - 10 hold - Hooklying Single Knee to Chest Stretch  - 1 x daily - 7 x weekly - 1 sets - 10 reps - 10 hold - Sit to Stand with Counter Support  - 3 x daily - 7 x weekly - 2 sets - 10 reps - 5 hold 07/05/22 - Supine Bridge  - 1 x daily - 7 x weekly - 1-2 sets - 10 reps - 3-5 hold - Hooklying Clamshell with Resistance  - 2 x daily - 7 x weekly - 1-2 sets - 10 reps - slow hold   ASSESSMENT:   CLINICAL IMPRESSION: Patient is a 87 y.o. female who was seen today for physical therapy  treatment for low back pain with balance impairments. TUG captured at 22.19 seconds. She arrives with new SPC and it was adjusted to correct height. Used parallel bars with singe UE to assist with sequencing. Sh needs cues to maintain sequencing with SPC. She reports 6/10 back pain today and difficulty sleeping due to left ankle and left upper hamstring/ gluteal pain. Began hip/lumbar stabilization and updated HEP. She denied increased pain with today's session.    OBJECTIVE IMPAIRMENTS: Abnormal gait, decreased activity tolerance, decreased balance, decreased coordination, decreased knowledge of use of DME, decreased mobility, difficulty walking, decreased ROM, decreased strength, decreased safety awareness, increased fascial restrictions, improper body mechanics, postural dysfunction, and pain.    ACTIVITY LIMITATIONS: carrying, lifting, bending, standing, squatting, sleeping, stairs, transfers, bed mobility, and locomotion level   PARTICIPATION LIMITATIONS: meal prep, cleaning, laundry, shopping, community activity, and church   PERSONAL FACTORS: Age and 1-2 comorbidities: osteoporosis, precious back surgeries  are also affecting patient's functional outcome.    REHAB POTENTIAL: Good   CLINICAL DECISION MAKING: Evolving/moderate complexity   EVALUATION COMPLEXITY: Moderate     GOALS:     LONG TERM GOALS: Target date: 07/22/2022     Pt will be I with HEP Baseline: given basic on eval  Goal status: INITIAL   2.  Pt will complete balance testing and goal set Baseline:  Goal status: INITIAL   3.  Pt will be able to safely Sit to stand without compensations in 15 sec or less  Baseline:  Goal status: INITIAL   4.  Pt will be able to ambulate with cane without cues and mod I , 300 feet while carrying handbag  Baseline:  Goal status: INITIAL   5. Pt will not be limited by pain in her back for home tasks  Baseline:  Goal status: INITIAL     PLAN:   PT FREQUENCY: 1-2x/week    PT DURATION: 6 weeks   PLANNED INTERVENTIONS: Therapeutic exercises, Therapeutic activity, Neuromuscular re-education, Balance training, Gait training, Patient/Family education, Self Care, Stair training, DME instructions, Cryotherapy, Moist heat, Manual  therapy, and Re-evaluation.   PLAN FOR NEXT SESSION: , balance screen, check balance , assess HEP, more hip stab and stretches to reduce pain.     Jannette Spanner, PTA 07/05/22 12:42 PM Phone: [phone] Fax: [phone]

## 2022-07-07 ENCOUNTER — Ambulatory Visit: Payer: Medicare Other | Admitting: Physical Therapy

## 2022-07-07 ENCOUNTER — Encounter: Payer: Self-pay | Admitting: Physical Therapy

## 2022-07-07 DIAGNOSIS — R262 Difficulty in walking, not elsewhere classified: Secondary | ICD-10-CM

## 2022-07-07 DIAGNOSIS — R293 Abnormal posture: Secondary | ICD-10-CM | POA: Diagnosis not present

## 2022-07-07 DIAGNOSIS — M5459 Other low back pain: Secondary | ICD-10-CM | POA: Diagnosis not present

## 2022-07-07 DIAGNOSIS — M6281 Muscle weakness (generalized): Secondary | ICD-10-CM | POA: Diagnosis not present

## 2022-07-07 DIAGNOSIS — G8929 Other chronic pain: Secondary | ICD-10-CM | POA: Diagnosis not present

## 2022-07-07 DIAGNOSIS — R2689 Other abnormalities of gait and mobility: Secondary | ICD-10-CM | POA: Diagnosis not present

## 2022-07-07 DIAGNOSIS — M25552 Pain in left hip: Secondary | ICD-10-CM | POA: Diagnosis not present

## 2022-07-07 DIAGNOSIS — M5442 Lumbago with sciatica, left side: Secondary | ICD-10-CM | POA: Diagnosis not present

## 2022-07-07 NOTE — Therapy (Signed)
OUTPATIENT PHYSICAL THERAPY TREATMENT NOTE   Patient Name: Danielle Hernandez MRN: [id number] DOB:06/09/1935, 87 y.o., female Today's Date: 07/07/2022  PCP: Dr. Lupita Raider   REFERRING PROVIDER: Tressie Stalker, MD  END OF SESSION:   PT End of Session - 07/07/22 1206     Visit Number 3    Number of Visits 12    Date for PT Re-Evaluation 07/22/22    PT Start Time 1200   pt arrived late   PT Stop Time 1230    PT Time Calculation (min) 30 min             Past Medical History:  Diagnosis Date   Arthritis    Headache(784.0)    HTN (hypertension)    Osteoporosis    Raynaud disease    Past Surgical History:  Procedure Laterality Date   ABDOMINAL HYSTERECTOMY     BACK SURGERY     EYE SURGERY     Bilateral cataracts   LUMBAR LAMINECTOMY/DECOMPRESSION MICRODISCECTOMY Bilateral 11/02/2021   Procedure: T12-L1, L1-L2 LAM,FORAM;  Surgeon: Tressie Stalker, MD;  Location: [facility] OR;  Service: Neurosurgery;  Laterality: Bilateral;  3C   TONSILLECTOMY     under chin cyst removed     WRIST GANGLION EXCISION     right hand   Patient Active Problem List   Diagnosis Date Noted   Myelopathy concurrent with and due to spinal stenosis of thoracic region (HCC) 11/02/2021   CDNH (chondrodermatitis nodularis helicis), right 02/21/2018   Impacted cerumen of left ear 03/04/2016   Fecal incontinence 04/26/2012   Lumbar stenosis 02/08/2011    REFERRING DIAG: spinal stenosis s/p L1-L2 laminotomy /foraminotomy   THERAPY DIAG:  Difficulty in walking, not elsewhere classified  Muscle weakness (generalized)  Rationale for Evaluation and Treatment Rehabilitation  PERTINENT HISTORY:  Osteoporosis  Arthritis Raynaud HTN 11/02/21: T12-L1, L1-L2 Laminectomy, decompression, microdisc.  L shoulder fx and L wrist fx s/p falls  2013: 3 level PLIF   PRECAUTIONS: Fall  SUBJECTIVE:                                                                                                                                                                                       SUBJECTIVE STATEMENT:  I got a cane on Friday. Both ankles hurt at night. I did not sleep until after 3 am.    PAIN:  Are you having pain? Yes: NPRS scale: 6/10 Pain location: back , L leg mostly  Pain description: stiff, achy, weak Aggravating factors: getting out of bed  Relieving factors: sitting to do her housework    OBJECTIVE: (objective measures completed at initial evaluation unless otherwise dated)  DIAGNOSTIC FINDINGS:  None recent    PATIENT SURVEYS:  FOTO 54% 07/05/22    SCREENING FOR RED FLAGS: Bowel or bladder incontinence: Yes: premorbid Spinal tumors: No Cauda equina syndrome: No Compression fracture: No Abdominal aneurysm: No   COGNITION: Overall cognitive status: Within functional limits for tasks assessed                          SENSATION: WFL   MUSCLE LENGTH: Hamstrings: tight Thomas test: NT    POSTURE: rounded shoulders, forward head, and left pelvic obliquity High L hip , leans Rt    PALPATION: TTP Lt. lumbar    LUMBAR ROM:    AROM eval  Flexion deferred  Extension    Right lateral flexion    Left lateral flexion    Right rotation 25%  Left rotation 25%   (Blank rows = not tested)   LOWER EXTREMITY ROM:      Passive  Right eval Left  eval  Hip flexion WNL WNL   Hip extension      Hip abduction      Hip adduction      Hip internal rotation 15-20 deg 25 deg   Hip external rotation 25 deg  30 deg   Knee flexion WNL WNL   Knee extension      Ankle dorsiflexion stiff stiff  Ankle plantarflexion      Ankle inversion      Ankle eversion       (Blank rows = not tested)   LOWER EXTREMITY MMT:     MMT Right eval Left eval  Hip flexion 4 4+  Hip extension      Hip abduction      Hip adduction      Hip internal rotation      Hip external rotation      Knee flexion 4 4  Knee extension 4 4  Ankle dorsiflexion 4+ 3+  Ankle plantarflexion      Ankle  inversion      Ankle eversion       (Blank rows = not tested)   LUMBAR SPECIAL TESTS:  NT    FUNCTIONAL TESTS:  5 times sit to stand: 10.3 30 seconds chair stand test 9 reps, max cues to avoid using back of legs on the mat  07/05/22: TUG 22.19 sec with SPC   GAIT: Distance walked: 150 Assistive device utilized: Single point cane and without Level of assistance: Min A pt needs close supervision with cane, min A without  Comments: foot catches on the floor, min A to recover balance   TODAY'S TREATMENT:                                                                                                                              [facility] Adult PT Treatment:  DATE: 07/07/22 Therapeutic Exercise: STS x 10, bariatric chair Bridge 10 x 2  Hooklying ball squeeze 5 sec x 10  Bridge with ball squeeze x 3  Supine hamstring stretch with strap x 2 each  Supine clam with red band 10 x 2  Banded bridge red x 10  LTR x 10  Figure 4 push and pull Left  and right -tight bilat Manual Therapy: A/P hip mobs grade 2  LAD left hip 10 sec x 3  Therapeutic Activity:  Gait with SPC 150 feet, 75 feet, frequent cues    [facility] Adult PT Treatment:                                                DATE: 07/05/22 Therapeutic Exercise: SKTC LTR Bridge 10 x 2  Supine clam red band 10 x 2  Therapeutic Activity: TUG STS x 10 -cues to reach for weight shifting FOTO Gait with SPC Gait in parallel bars using only right UE    DATE: 06/10/22      PATIENT EDUCATION:  Education details: PT, POC, HEP, sit to stand mechanics, balance, cane  Person educated: Patient Education method: Programmer, multimedia, Demonstration, Verbal cues, and Handouts Education comprehension: verbalized understanding and needs further education   HOME EXERCISE PROGRAM: Access Code: [email or web address] URL: [email or web address] Date: 06/10/2022 Prepared by: Karie Mainland   Exercises - Supine  Lower Trunk Rotation  - 1 x daily - 7 x weekly - 2 sets - 10 reps - 10 hold - Hooklying Single Knee to Chest Stretch  - 1 x daily - 7 x weekly - 1 sets - 10 reps - 10 hold - Sit to Stand with Counter Support  - 3 x daily - 7 x weekly - 2 sets - 10 reps - 5 hold 07/05/22 - Supine Bridge  - 1 x daily - 7 x weekly - 1-2 sets - 10 reps - 3-5 hold - Hooklying Clamshell with Resistance  - 2 x daily - 7 x weekly - 1-2 sets - 10 reps - slow hold   ASSESSMENT:   CLINICAL IMPRESSION: Patient is a 87 y.o. female who was seen today for physical therapy treatment for low back pain with balance impairments. Continued to work on sequencing with SPC , requiring mod cues. She reports upper hamstring/ gluteal pain still bothersome, rates it at 6/10 now and still increases at night. Continued with hip mobility and strengthening. Gentle LAD to left hip and gentle A/P mobs performed followed by left hip PROM. No increased pain at end of session. Short session due to tardiness.     OBJECTIVE IMPAIRMENTS: Abnormal gait, decreased activity tolerance, decreased balance, decreased coordination, decreased knowledge of use of DME, decreased mobility, difficulty walking, decreased ROM, decreased strength, decreased safety awareness, increased fascial restrictions, improper body mechanics, postural dysfunction, and pain.    ACTIVITY LIMITATIONS: carrying, lifting, bending, standing, squatting, sleeping, stairs, transfers, bed mobility, and locomotion level   PARTICIPATION LIMITATIONS: meal prep, cleaning, laundry, shopping, community activity, and church   PERSONAL FACTORS: Age and 1-2 comorbidities: osteoporosis, precious back surgeries  are also affecting patient's functional outcome.    REHAB POTENTIAL: Good   CLINICAL DECISION MAKING: Evolving/moderate complexity   EVALUATION COMPLEXITY: Moderate     GOALS:     LONG TERM GOALS: Target date: 07/22/2022     Pt will be I with HEP  Baseline: given basic on eval   Goal status: INITIAL   2.  Pt will complete balance testing and goal set Baseline:  Goal status: INITIAL   3.  Pt will be able to safely Sit to stand without compensations in 15 sec or less  Baseline:  Goal status: INITIAL   4.  Pt will be able to ambulate with cane without cues and mod I , 300 feet while carrying handbag  Baseline:  Goal status: INITIAL   5. Pt will not be limited by pain in her back for home tasks  Baseline:  Goal status: INITIAL     PLAN:   PT FREQUENCY: 1-2x/week   PT DURATION: 6 weeks   PLANNED INTERVENTIONS: Therapeutic exercises, Therapeutic activity, Neuromuscular re-education, Balance training, Gait training, Patient/Family education, Self Care, Stair training, DME instructions, Cryotherapy, Moist heat, Manual therapy, and Re-evaluation.   PLAN FOR NEXT SESSION: , balance screen, check balance , assess HEP, more hip stab and stretches to reduce pain.     Jannette Spanner, PTA 07/07/22 12:40 PM Phone: [phone] Fax: [phone]

## 2022-07-12 ENCOUNTER — Ambulatory Visit: Payer: Medicare Other | Admitting: Physical Therapy

## 2022-07-12 ENCOUNTER — Encounter: Payer: Self-pay | Admitting: Physical Therapy

## 2022-07-12 DIAGNOSIS — M5442 Lumbago with sciatica, left side: Secondary | ICD-10-CM | POA: Diagnosis not present

## 2022-07-12 DIAGNOSIS — M25552 Pain in left hip: Secondary | ICD-10-CM | POA: Diagnosis not present

## 2022-07-12 DIAGNOSIS — M6281 Muscle weakness (generalized): Secondary | ICD-10-CM | POA: Diagnosis not present

## 2022-07-12 DIAGNOSIS — G8929 Other chronic pain: Secondary | ICD-10-CM | POA: Diagnosis not present

## 2022-07-12 DIAGNOSIS — M5459 Other low back pain: Secondary | ICD-10-CM | POA: Diagnosis not present

## 2022-07-12 DIAGNOSIS — R262 Difficulty in walking, not elsewhere classified: Secondary | ICD-10-CM | POA: Diagnosis not present

## 2022-07-12 DIAGNOSIS — R2689 Other abnormalities of gait and mobility: Secondary | ICD-10-CM | POA: Diagnosis not present

## 2022-07-12 DIAGNOSIS — R293 Abnormal posture: Secondary | ICD-10-CM | POA: Diagnosis not present

## 2022-07-12 NOTE — Therapy (Signed)
OUTPATIENT PHYSICAL THERAPY TREATMENT NOTE   Patient Name: Danielle Hernandez MRN: [id number] DOB:06-May-1935, 87 y.o., female Today's Date: 07/12/2022  PCP: Dr. Lupita Raider   REFERRING PROVIDER: Tressie Stalker, MD  END OF SESSION:   PT End of Session - 07/12/22 1036     Visit Number 4    Number of Visits 12    Date for PT Re-Evaluation 07/22/22    PT Start Time 1019    PT Stop Time 1100    PT Time Calculation (min) 41 min              Past Medical History:  Diagnosis Date   Arthritis    Headache(784.0)    HTN (hypertension)    Osteoporosis    Raynaud disease    Past Surgical History:  Procedure Laterality Date   ABDOMINAL HYSTERECTOMY     BACK SURGERY     EYE SURGERY     Bilateral cataracts   LUMBAR LAMINECTOMY/DECOMPRESSION MICRODISCECTOMY Bilateral 11/02/2021   Procedure: T12-L1, L1-L2 LAM,FORAM;  Surgeon: Tressie Stalker, MD;  Location: [facility] OR;  Service: Neurosurgery;  Laterality: Bilateral;  3C   TONSILLECTOMY     under chin cyst removed     WRIST GANGLION EXCISION     right hand   Patient Active Problem List   Diagnosis Date Noted   Myelopathy concurrent with and due to spinal stenosis of thoracic region (HCC) 11/02/2021   CDNH (chondrodermatitis nodularis helicis), right 02/21/2018   Impacted cerumen of left ear 03/04/2016   Fecal incontinence 04/26/2012   Lumbar stenosis 02/08/2011    REFERRING DIAG: spinal stenosis s/p L1-L2 laminotomy /foraminotomy   THERAPY DIAG:  Difficulty in walking, not elsewhere classified  Muscle weakness (generalized)  Rationale for Evaluation and Treatment Rehabilitation  PERTINENT HISTORY:  Osteoporosis  Arthritis Raynaud HTN 11/02/21: T12-L1, L1-L2 Laminectomy, decompression, microdisc.  L shoulder fx and L wrist fx s/p falls  2013: 3 level PLIF   PRECAUTIONS: Fall  SUBJECTIVE:                                                                                                                                                                                       SUBJECTIVE STATEMENT: The pain happens every 10 seconds in my buttock/upper leg. The back pain comes and goes with activity.    PAIN:  Are you having pain? Yes: NPRS scale: 7/10 Pain location: back of  L leg mostly  Pain description: stiff, achy, weak Aggravating factors: getting out of bed  Relieving factors: sitting to do her housework    OBJECTIVE: (objective measures completed at initial evaluation unless otherwise dated)   DIAGNOSTIC FINDINGS:  None  recent    PATIENT SURVEYS:  FOTO 54% 07/05/22    SCREENING FOR RED FLAGS: Bowel or bladder incontinence: Yes: premorbid Spinal tumors: No Cauda equina syndrome: No Compression fracture: No Abdominal aneurysm: No   COGNITION: Overall cognitive status: Within functional limits for tasks assessed                          SENSATION: WFL   MUSCLE LENGTH: Hamstrings: tight Thomas test: NT    POSTURE: rounded shoulders, forward head, and left pelvic obliquity High L hip , leans Rt    PALPATION: TTP Lt. lumbar    LUMBAR ROM:    AROM eval  Flexion deferred  Extension    Right lateral flexion    Left lateral flexion    Right rotation 25%  Left rotation 25%   (Blank rows = not tested)   LOWER EXTREMITY ROM:      Passive  Right eval Left  eval  Hip flexion WNL WNL   Hip extension      Hip abduction      Hip adduction      Hip internal rotation 15-20 deg 25 deg   Hip external rotation 25 deg  30 deg   Knee flexion WNL WNL   Knee extension      Ankle dorsiflexion stiff stiff  Ankle plantarflexion      Ankle inversion      Ankle eversion       (Blank rows = not tested)   LOWER EXTREMITY MMT:     MMT Right eval Left eval 07/12/22 right 07/12/22 left  Hip flexion 4 4+    Hip extension        Hip abduction     3 3-  Hip adduction        Hip internal rotation        Hip external rotation        Knee flexion 4 4    Knee extension 4 4    Ankle  dorsiflexion 4+ 3+    Ankle plantarflexion        Ankle inversion        Ankle eversion         (Blank rows = not tested)   LUMBAR SPECIAL TESTS:  NT    FUNCTIONAL TESTS:  5 times sit to stand: 10.3 30 seconds chair stand test 9 reps, max cues to avoid using back of legs on the mat  07/05/22: TUG 22.19 sec with SPC   GAIT: Distance walked: 150 Assistive device utilized: Single point cane and without Level of assistance: Min A pt needs close supervision with cane, min A without  Comments: foot catches on the floor, min A to recover balance   TODAY'S TREATMENT:  [facility] Adult PT Treatment:                                                DATE: 07/12/22 Therapeutic Exercise: LTR Figure 4 push  Hamstring stretch with strap  Supine green band clam  Banded bridge green  Hooklying ball squeeze  STS x 10 Heel raises x 10  Tandem stance 10- 15 sec  Standing hip abduction - min cues for technique  Side lying clam x 10- mod cues for form  Sidelying hip abduction- unable to lift on left without assist.    [facility] Adult PT Treatment:                                                DATE: 07/07/22 Therapeutic Exercise: STS x 10, bariatric chair Bridge 10 x 2  Hooklying ball squeeze 5 sec x 10  Bridge with ball squeeze x 3  Supine hamstring stretch with strap x 2 each  Supine clam with red band 10 x 2  Banded bridge red x 10  LTR x 10  Figure 4 push and pull Left  and right -tight bilat Manual Therapy: A/P hip mobs grade 2  LAD left hip 10 sec x 3  Therapeutic Activity:  Gait with SPC 150 feet, 75 feet, frequent cues    [facility] Adult PT Treatment:                                                DATE: 07/05/22 Therapeutic Exercise: SKTC LTR Bridge 10 x 2  Supine clam red band 10 x 2  Therapeutic Activity: TUG STS x 10 -cues to reach for weight shifting FOTO Gait  with SPC Gait in parallel bars using only right UE    DATE: 06/10/22      PATIENT EDUCATION:  Education details: PT, POC, HEP, sit to stand mechanics, balance, cane  Person educated: Patient Education method: Programmer, multimedia, Demonstration, Verbal cues, and Handouts Education comprehension: verbalized understanding and needs further education   HOME EXERCISE PROGRAM: Access Code: [email or web address] URL: [email or web address] Date: 06/10/2022 Prepared by: Karie Mainland   Exercises - Supine Lower Trunk Rotation  - 1 x daily - 7 x weekly - 2 sets - 10 reps - 10 hold - Hooklying Single Knee to Chest Stretch  - 1 x daily - 7 x weekly - 1 sets - 10 reps - 10 hold - Sit to Stand with Counter Support  - 3 x daily - 7 x weekly - 2 sets - 10 reps - 5 hold 07/05/22 - Supine Bridge  - 1 x daily - 7 x weekly - 1-2 sets - 10 reps - 3-5 hold - Hooklying Clamshell with Resistance  - 2 x daily - 7 x weekly - 1-2 sets - 10 reps - slow hold 07/12/22 - Standing Tandem Balance with Counter Support  - 1 x daily - 7 x weekly - 1 sets - 3 reps - 10-15 hold - Standing Hip Abduction with Counter Support  - 1 x daily - 7 x weekly - 2 sets - 10 reps  ASSESSMENT:   CLINICAL IMPRESSION: Patient is a 87 y.o. female who was seen today for physical therapy treatment for low back pain with balance impairments. Continued to work on sequencing with SPC , requiring mod cues. She demonstrates Trendelenburg weakness on left. In side lying unable to lift into hip abduction on left. She reports upper hamstring/ gluteal pain still bothersome, rates it at 7/10 now and still increases at night. Continued with hip mobility and strengthening. Very stiff in hip ER bilateral.  Worked on balance with tandem stance which she can hold 10-15 seconds. Updated HEP with standing hip abduction and tandem stance. Will continue to assess response to interventions and progress as tolerated.      OBJECTIVE IMPAIRMENTS: Abnormal gait,  decreased activity tolerance, decreased balance, decreased coordination, decreased knowledge of use of DME, decreased mobility, difficulty walking, decreased ROM, decreased strength, decreased safety awareness, increased fascial restrictions, improper body mechanics, postural dysfunction, and pain.    ACTIVITY LIMITATIONS: carrying, lifting, bending, standing, squatting, sleeping, stairs, transfers, bed mobility, and locomotion level   PARTICIPATION LIMITATIONS: meal prep, cleaning, laundry, shopping, community activity, and church   PERSONAL FACTORS: Age and 1-2 comorbidities: osteoporosis, precious back surgeries  are also affecting patient's functional outcome.    REHAB POTENTIAL: Good   CLINICAL DECISION MAKING: Evolving/moderate complexity   EVALUATION COMPLEXITY: Moderate     GOALS:     LONG TERM GOALS: Target date: 07/22/2022     Pt will be I with HEP Baseline: given basic on eval  Goal status: INITIAL   2.  Pt will complete balance testing and goal set Baseline:  Goal status: INITIAL   3.  Pt will be able to safely Sit to stand without compensations in 15 sec or less  Baseline:  Goal status: INITIAL   4.  Pt will be able to ambulate with cane without cues and mod I , 300 feet while carrying handbag  Baseline:  Goal status: INITIAL   5. Pt will not be limited by pain in her back for home tasks  Baseline:  Goal status: INITIAL     PLAN:   PT FREQUENCY: 1-2x/week   PT DURATION: 6 weeks   PLANNED INTERVENTIONS: Therapeutic exercises, Therapeutic activity, Neuromuscular re-education, Balance training, Gait training, Patient/Family education, Self Care, Stair training, DME instructions, Cryotherapy, Moist heat, Manual therapy, and Re-evaluation.   PLAN FOR NEXT SESSION: , balance screen, check balance , assess HEP, more hip stab and stretches to reduce pain. Give green band     Jannette Spanner, PTA 07/12/22 11:19 AM Phone: [phone] Fax: [phone]

## 2022-07-14 ENCOUNTER — Ambulatory Visit: Payer: Medicare Other | Admitting: Physical Therapy

## 2022-07-14 ENCOUNTER — Encounter: Payer: Self-pay | Admitting: Physical Therapy

## 2022-07-14 DIAGNOSIS — M6281 Muscle weakness (generalized): Secondary | ICD-10-CM

## 2022-07-14 DIAGNOSIS — R262 Difficulty in walking, not elsewhere classified: Secondary | ICD-10-CM | POA: Diagnosis not present

## 2022-07-14 DIAGNOSIS — R2689 Other abnormalities of gait and mobility: Secondary | ICD-10-CM | POA: Diagnosis not present

## 2022-07-14 DIAGNOSIS — M25552 Pain in left hip: Secondary | ICD-10-CM | POA: Diagnosis not present

## 2022-07-14 DIAGNOSIS — M5459 Other low back pain: Secondary | ICD-10-CM

## 2022-07-14 DIAGNOSIS — R293 Abnormal posture: Secondary | ICD-10-CM | POA: Diagnosis not present

## 2022-07-14 DIAGNOSIS — M5442 Lumbago with sciatica, left side: Secondary | ICD-10-CM | POA: Diagnosis not present

## 2022-07-14 DIAGNOSIS — G8929 Other chronic pain: Secondary | ICD-10-CM | POA: Diagnosis not present

## 2022-07-14 NOTE — Therapy (Signed)
OUTPATIENT PHYSICAL THERAPY TREATMENT NOTE   Patient Name: Danielle Hernandez MRN: [id number] DOB:11-04-1935, 87 y.o., female Today's Date: 07/14/2022  PCP: Dr. Lupita Raider   REFERRING PROVIDER: Tressie Stalker, MD  END OF SESSION:   PT End of Session - 07/14/22 1141     Visit Number 5    Number of Visits 12    Date for PT Re-Evaluation 07/22/22    PT Start Time 1145    PT Stop Time 1230    PT Time Calculation (min) 45 min              Past Medical History:  Diagnosis Date   Arthritis    Headache(784.0)    HTN (hypertension)    Osteoporosis    Raynaud disease    Past Surgical History:  Procedure Laterality Date   ABDOMINAL HYSTERECTOMY     BACK SURGERY     EYE SURGERY     Bilateral cataracts   LUMBAR LAMINECTOMY/DECOMPRESSION MICRODISCECTOMY Bilateral 11/02/2021   Procedure: T12-L1, L1-L2 LAM,FORAM;  Surgeon: Tressie Stalker, MD;  Location: [facility] OR;  Service: Neurosurgery;  Laterality: Bilateral;  3C   TONSILLECTOMY     under chin cyst removed     WRIST GANGLION EXCISION     right hand   Patient Active Problem List   Diagnosis Date Noted   Myelopathy concurrent with and due to spinal stenosis of thoracic region (HCC) 11/02/2021   CDNH (chondrodermatitis nodularis helicis), right 02/21/2018   Impacted cerumen of left ear 03/04/2016   Fecal incontinence 04/26/2012   Lumbar stenosis 02/08/2011    REFERRING DIAG: spinal stenosis s/p L1-L2 laminotomy /foraminotomy   THERAPY DIAG:  Difficulty in walking, not elsewhere classified  Other low back pain  Muscle weakness (generalized)  Rationale for Evaluation and Treatment Rehabilitation  PERTINENT HISTORY:  Osteoporosis  Arthritis Raynaud HTN 11/02/21: T12-L1, L1-L2 Laminectomy, decompression, microdisc.  L shoulder fx and L wrist fx s/p falls  2013: 3 level PLIF   PRECAUTIONS: Fall  SUBJECTIVE:                                                                                                                                                                                       SUBJECTIVE STATEMENT: Still the same pain 8/10 pain every 10 seconds or less   PAIN:  Are you having pain? Yes: NPRS scale: 8/10 Pain location: back of  L leg mostly  Pain description: stiff, achy, weak Aggravating factors: getting out of bed  Relieving factors: sitting to do her housework    OBJECTIVE: (objective measures completed at initial evaluation unless otherwise dated)   DIAGNOSTIC FINDINGS:  None recent  PATIENT SURVEYS:  FOTO 54% 07/05/22    SCREENING FOR RED FLAGS: Bowel or bladder incontinence: Yes: premorbid Spinal tumors: No Cauda equina syndrome: No Compression fracture: No Abdominal aneurysm: No   COGNITION: Overall cognitive status: Within functional limits for tasks assessed                          SENSATION: WFL   MUSCLE LENGTH: Hamstrings: tight Thomas test: NT    POSTURE: rounded shoulders, forward head, and left pelvic obliquity High L hip , leans Rt    PALPATION: TTP Lt. lumbar    LUMBAR ROM:    AROM eval  Flexion deferred  Extension    Right lateral flexion    Left lateral flexion    Right rotation 25%  Left rotation 25%   (Blank rows = not tested)   LOWER EXTREMITY ROM:      Passive  Right eval Left  eval  Hip flexion WNL WNL   Hip extension      Hip abduction      Hip adduction      Hip internal rotation 15-20 deg 25 deg   Hip external rotation 25 deg  30 deg   Knee flexion WNL WNL   Knee extension      Ankle dorsiflexion stiff stiff  Ankle plantarflexion      Ankle inversion      Ankle eversion       (Blank rows = not tested)   LOWER EXTREMITY MMT:     MMT Right eval Left eval 07/12/22 right 07/12/22 left  Hip flexion 4 4+    Hip extension        Hip abduction     3 3-  Hip adduction        Hip internal rotation        Hip external rotation        Knee flexion 4 4    Knee extension 4 4    Ankle dorsiflexion 4+ 3+    Ankle  plantarflexion        Ankle inversion        Ankle eversion         (Blank rows = not tested)   LUMBAR SPECIAL TESTS:  NT    FUNCTIONAL TESTS:  5 times sit to stand: 10.3 30 seconds chair stand test 9 reps, max cues to avoid using back of legs on the mat  07/05/22: TUG 22.19 sec with SPC   GAIT: Distance walked: 150 Assistive device utilized: Single point cane and without Level of assistance: Min A pt needs close supervision with cane, min A without  Comments: foot catches on the floor, min A to recover balance   TODAY'S TREATMENT:                                                                                                                              [facility]  Adult PT Treatment:                                                DATE: 07/14/22 Therapeutic Exercise: Prone lying over pillows - for manual work, prone h/s curls alternating Green band clam alternating and bilateral  Supine march with green band  Banded bridge x 10  Ball squeeze x 10  Bridge with ball squeeze  PPT Ball under sacrum- pelvic tilts - limited rom- clam AROM  LTR   Therapeutic Activity: Prone- side lying- sit , needs verbal cues and assist Manual Therapy Prone gentle passive hip ER/IR, quad stretch, STW to left piriformis, glutea, hamstring     [facility] Adult PT Treatment:                                                DATE: 07/12/22 Therapeutic Exercise: LTR Figure 4 push  Hamstring stretch with strap  Supine green band clam  Banded bridge green  Hooklying ball squeeze  STS x 10 Heel raises x 10  Tandem stance 10- 15 sec  Standing hip abduction - min cues for technique  Side lying clam x 10- mod cues for form  Sidelying hip abduction- unable to lift on left without assist.    [facility] Adult PT Treatment:                                                DATE: 07/07/22 Therapeutic Exercise: STS x 10, bariatric chair Bridge 10 x 2  Hooklying ball squeeze 5 sec x 10  Bridge with ball squeeze x 3  Supine  hamstring stretch with strap x 2 each  Supine clam with red band 10 x 2  Banded bridge red x 10  LTR x 10  Figure 4 push and pull Left  and right -tight bilat Manual Therapy: A/P hip mobs grade 2  LAD left hip 10 sec x 3  Therapeutic Activity:  Gait with SPC 150 feet, 75 feet, frequent cues    [facility] Adult PT Treatment:                                                DATE: 07/05/22 Therapeutic Exercise: SKTC LTR Bridge 10 x 2  Supine clam red band 10 x 2  Therapeutic Activity: TUG STS x 10 -cues to reach for weight shifting FOTO Gait with SPC Gait in parallel bars using only right UE    DATE: 06/10/22      PATIENT EDUCATION:  Education details: PT, POC, HEP, sit to stand mechanics, balance, cane  Person educated: Patient Education method: Programmer, multimedia, Demonstration, Verbal cues, and Handouts Education comprehension: verbalized understanding and needs further education   HOME EXERCISE PROGRAM: Access Code: [email or web address] URL: [email or web address] Date: 06/10/2022 Prepared by: Karie Mainland   Exercises - Supine Lower Trunk Rotation  - 1 x daily - 7 x weekly - 2 sets - 10 reps - 10 hold - Hooklying Single Knee to  Chest Stretch  - 1 x daily - 7 x weekly - 1 sets - 10 reps - 10 hold - Sit to Stand with Counter Support  - 3 x daily - 7 x weekly - 2 sets - 10 reps - 5 hold 07/05/22 - Supine Bridge  - 1 x daily - 7 x weekly - 1-2 sets - 10 reps - 3-5 hold - Hooklying Clamshell with Resistance  - 2 x daily - 7 x weekly - 1-2 sets - 10 reps - slow hold 07/12/22 - Standing Tandem Balance with Counter Support  - 1 x daily - 7 x weekly - 1 sets - 3 reps - 10-15 hold - Standing Hip Abduction with Counter Support  - 1 x daily - 7 x weekly - 2 sets - 10 reps   ASSESSMENT:   CLINICAL IMPRESSION: Patient is a 87 y.o. female who was seen today for physical therapy treatment for low back pain with balance impairments. Continued to work on sequencing with SPC , requiring mod cues.  She continues with Trendelenburg weakness on left. She reports upper hamstring/ gluteal pain still bothersome, rates it at 8/10 now and still increases at night. She describes it as a quick stab of pain every 10 seconds. Continued with hip mobility and strengthening. Performed PROM, STW and LAD without relief of symptoms. Continues to be very stiff in lumbar/hip mobility. Will continue to assess response to interventions and progress as tolerated. She did have a fall onto right side after tripping over her shoes. She reports no injury, small bruise on right hip.      OBJECTIVE IMPAIRMENTS: Abnormal gait, decreased activity tolerance, decreased balance, decreased coordination, decreased knowledge of use of DME, decreased mobility, difficulty walking, decreased ROM, decreased strength, decreased safety awareness, increased fascial restrictions, improper body mechanics, postural dysfunction, and pain.    ACTIVITY LIMITATIONS: carrying, lifting, bending, standing, squatting, sleeping, stairs, transfers, bed mobility, and locomotion level   PARTICIPATION LIMITATIONS: meal prep, cleaning, laundry, shopping, community activity, and church   PERSONAL FACTORS: Age and 1-2 comorbidities: osteoporosis, precious back surgeries  are also affecting patient's functional outcome.    REHAB POTENTIAL: Good   CLINICAL DECISION MAKING: Evolving/moderate complexity   EVALUATION COMPLEXITY: Moderate     GOALS:     LONG TERM GOALS: Target date: 07/22/2022     Pt will be I with HEP Baseline: given basic on eval  Goal status: INITIAL   2.  Pt will complete balance testing and goal set Baseline:  Goal status: INITIAL   3.  Pt will be able to safely Sit to stand without compensations in 15 sec or less  Baseline:  Goal status: INITIAL   4.  Pt will be able to ambulate with cane without cues and mod I , 300 feet while carrying handbag  Baseline:  Goal status: INITIAL   5. Pt will not be limited by pain in  her back for home tasks  Baseline:  Goal status: INITIAL     PLAN:   PT FREQUENCY: 1-2x/week   PT DURATION: 6 weeks   PLANNED INTERVENTIONS: Therapeutic exercises, Therapeutic activity, Neuromuscular re-education, Balance training, Gait training, Patient/Family education, Self Care, Stair training, DME instructions, Cryotherapy, Moist heat, Manual therapy, and Re-evaluation.   PLAN FOR NEXT SESSION: , balance screen, check balance , assess HEP, more hip stab and stretches/ manual to reduce pain. Give green band      Jannette Spanner, PTA 07/14/22 1:01 PM Phone: [phone] Fax: [phone]

## 2022-07-16 NOTE — Therapy (Signed)
OUTPATIENT PHYSICAL THERAPY TREATMENT NOTE   Patient Name: Danielle Hernandez MRN: [id number] DOB:1935-12-27, 87 y.o., female Today's Date: 07/19/2022  PCP: Dr. Lupita Raider   REFERRING PROVIDER: Tressie Stalker, MD  END OF SESSION:   PT End of Session - 07/19/22 1103     Visit Number 6    Number of Visits 12    Date for PT Re-Evaluation 07/22/22    Authorization Type [employer]    PT Start Time 1100    PT Stop Time 1145    PT Time Calculation (min) 45 min    Activity Tolerance Patient tolerated treatment well    Behavior During Therapy WFL for tasks assessed/performed               Past Medical History:  Diagnosis Date   Arthritis    Headache(784.0)    HTN (hypertension)    Osteoporosis    Raynaud disease    Past Surgical History:  Procedure Laterality Date   ABDOMINAL HYSTERECTOMY     BACK SURGERY     EYE SURGERY     Bilateral cataracts   LUMBAR LAMINECTOMY/DECOMPRESSION MICRODISCECTOMY Bilateral 11/02/2021   Procedure: T12-L1, L1-L2 LAM,FORAM;  Surgeon: Tressie Stalker, MD;  Location: [facility] OR;  Service: Neurosurgery;  Laterality: Bilateral;  3C   TONSILLECTOMY     under chin cyst removed     WRIST GANGLION EXCISION     right hand   Patient Active Problem List   Diagnosis Date Noted   Myelopathy concurrent with and due to spinal stenosis of thoracic region (HCC) 11/02/2021   CDNH (chondrodermatitis nodularis helicis), right 02/21/2018   Impacted cerumen of left ear 03/04/2016   Fecal incontinence 04/26/2012   Lumbar stenosis 02/08/2011    REFERRING DIAG: spinal stenosis s/p L1-L2 laminotomy /foraminotomy   THERAPY DIAG:  Difficulty in walking, not elsewhere classified  Other low back pain  Muscle weakness (generalized)  Abnormality of gait due to impairment of balance  Chronic left-sided low back pain with left-sided sciatica  Pain in left hip  Abnormal posture  Rationale for Evaluation and Treatment Rehabilitation  PERTINENT HISTORY:   Osteoporosis  Arthritis Raynaud HTN 11/02/21: T12-L1, L1-L2 Laminectomy, decompression, microdisc.  L shoulder fx and L wrist fx s/p falls  2013: 3 level PLIF   PRECAUTIONS: Fall  SUBJECTIVE:                                                                                                                                                                                      SUBJECTIVE STATEMENT: Back is pretty bad today.  7/10 in post L hip/leg (points to ischial tuberosity).  Some days I feel  like PT is helping, other days not so much.  PAIN:  Are you having pain? Yes: NPRS scale: 7/10 Pain location: back of  L leg mostly  Pain description: stiff, achy, weak Aggravating factors: getting out of bed  Relieving factors: sitting to do her housework    OBJECTIVE: (objective measures completed at initial evaluation unless otherwise dated)   DIAGNOSTIC FINDINGS:  None recent    PATIENT SURVEYS:  FOTO 54% 07/05/22        07/19/22    SCREENING FOR RED FLAGS: Bowel or bladder incontinence: Yes: premorbid Spinal tumors: No Cauda equina syndrome: No Compression fracture: No Abdominal aneurysm: No   COGNITION: Overall cognitive status: Within functional limits for tasks assessed                          SENSATION: WFL   MUSCLE LENGTH: Hamstrings: tight Thomas test: NT    POSTURE: rounded shoulders, forward head, and left pelvic obliquity High L hip , leans Rt    PALPATION: TTP Lt. lumbar    LUMBAR ROM:    AROM eval  Flexion deferred  Extension    Right lateral flexion    Left lateral flexion    Right rotation 25%  Left rotation 25%   (Blank rows = not tested)   LOWER EXTREMITY ROM:      Passive  Right eval Left  eval  Hip flexion WNL WNL   Hip extension      Hip abduction      Hip adduction      Hip internal rotation 15-20 deg 25 deg   Hip external rotation 25 deg  30 deg   Knee flexion WNL WNL   Knee extension      Ankle dorsiflexion stiff stiff  Ankle  plantarflexion      Ankle inversion      Ankle eversion       (Blank rows = not tested)   LOWER EXTREMITY MMT:     MMT Right eval Left eval 07/12/22 right 07/12/22 left  Hip flexion 4 4+    Hip extension        Hip abduction     3 3-  Hip adduction        Hip internal rotation        Hip external rotation        Knee flexion 4 4    Knee extension 4 4    Ankle dorsiflexion 4+ 3+    Ankle plantarflexion        Ankle inversion        Ankle eversion         (Blank rows = not tested)   LUMBAR SPECIAL TESTS:  NT    FUNCTIONAL TESTS:  5 times sit to stand: 10.3 30 seconds chair stand test 9 reps, max cues to avoid using back of legs on the mat  07/05/22: TUG 22.19 sec with SPC 07/19/22: Berg 42/56   GAIT: Distance walked: 150 Assistive device utilized: Single point cane and without Level of assistance: Min A pt needs close supervision with cane, min A without  Comments: foot catches on the floor, min A to recover balance   TODAY'S TREATMENT:             [facility] Adult PT Treatment:  DATE: 07/19/22 Therapeutic Exercise: Seated hamstring stretch with step 30 sec  x 3  Sit to stand x 10 no UE's  5 X STS 13.5 sec elevated mat  Green x 15 bilateral clam  Bridge x 15 with band  Hamstring stretch 30 sec x 3  Active hamstring stretch LLE x 10  LTR x 10   Therapeutic Activity: BERG BALANCE Sitting to Standing: Numbers; 0-4: 3 Standing unsupported: Numbers; 0-4: 4 Sitting unsupported: Numbers; 0-4: 4 Standing to Sitting: Numbers; 0-4: 4 Transfers: Numbers; 0-4: 4 Standing with eyes closed: Numbers; 0-4: 4 Standing with feet together: Numbers; 0-4: 4 Reaching forward with outstretched arm: Numbers; 0-4: 3 Retrieving object from the floor: Numbers; 0-4: 4 Turning to look behind: Numbers; 0-4: 2 Turning 360 degrees: Numbers; 0-4: 2 Place alternate foot on stool: Numbers; 0-4: 1 Standing with one foot in front: Numbers; 0-4:  2 Standing on one foot: Numbers; 0-4: 1  Total Score: 42/56  Self Care: Fall prevention at night, lighting, rugs, use of cane for support                                                                                                                     [facility] Adult PT Treatment:                                                DATE: 07/14/22 Therapeutic Exercise: Prone lying over pillows - for manual work, prone h/s curls alternating Green band clam alternating and bilateral  Supine march with green band  Banded bridge x 10  Ball squeeze x 10  Bridge with ball squeeze  PPT Ball under sacrum- pelvic tilts - limited rom- clam AROM  LTR   Therapeutic Activity: Prone- side lying- sit , needs verbal cues and assist Manual Therapy Prone gentle passive hip ER/IR, quad stretch, STW to left piriformis, glutea, hamstring     [facility] Adult PT Treatment:                                                DATE: 07/12/22 Therapeutic Exercise: LTR Figure 4 push  Hamstring stretch with strap  Supine green band clam  Banded bridge green  Hooklying ball squeeze  STS x 10 Heel raises x 10  Tandem stance 10- 15 sec  Standing hip abduction - min cues for technique  Side lying clam x 10- mod cues for form  Sidelying hip abduction- unable to lift on left without assist.    [facility] Adult PT Treatment:  DATE: 07/07/22 Therapeutic Exercise: STS x 10, bariatric chair Bridge 10 x 2  Hooklying ball squeeze 5 sec x 10  Bridge with ball squeeze x 3  Supine hamstring stretch with strap x 2 each  Supine clam with red band 10 x 2  Banded bridge red x 10  LTR x 10  Figure 4 push and pull Left  and right -tight bilat Manual Therapy: A/P hip mobs grade 2  LAD left hip 10 sec x 3  Therapeutic Activity:  Gait with SPC 150 feet, 75 feet, frequent cues    [facility] Adult PT Treatment:                                                DATE: 07/05/22 Therapeutic  Exercise: SKTC LTR Bridge 10 x 2  Supine clam red band 10 x 2  Therapeutic Activity: TUG STS x 10 -cues to reach for weight shifting FOTO Gait with SPC Gait in parallel bars using only right UE    DATE: 06/10/22      PATIENT EDUCATION:  Education details: PT, POC, HEP, sit to stand mechanics, balance, cane  Person educated: Patient Education method: Programmer, multimedia, Demonstration, Verbal cues, and Handouts Education comprehension: verbalized understanding and needs further education   HOME EXERCISE PROGRAM: Access Code: [email or web address] URL: [email or web address] Date: 06/10/2022 Prepared by: Karie Mainland   Exercises - Supine Lower Trunk Rotation  - 1 x daily - 7 x weekly - 2 sets - 10 reps - 10 hold - Hooklying Single Knee to Chest Stretch  - 1 x daily - 7 x weekly - 1 sets - 10 reps - 10 hold - Sit to Stand with Counter Support  - 3 x daily - 7 x weekly - 2 sets - 10 reps - 5 hold 07/05/22 - Supine Bridge  - 1 x daily - 7 x weekly - 1-2 sets - 10 reps - 3-5 hold - Hooklying Clamshell with Resistance  - 2 x daily - 7 x weekly - 1-2 sets - 10 reps - slow hold 07/12/22 - Standing Tandem Balance with Counter Support  - 1 x daily - 7 x weekly - 1 sets - 3 reps - 10-15 hold - Standing Hip Abduction with Counter Support  - 1 x daily - 7 x weekly - 2 sets - 10 reps -Hamstring stretch    ASSESSMENT:   CLINICAL IMPRESSION: Patient continues to have left posterior hip pain and significant Trendelenburg causing instability with gait.  She reports her pain significantly interrupts her sleep at night , she has pain in her ankles well.  Her Berg balance test put her in the high risk of falls 42/56.  She has particular difficulty with turning, step taps and was unsteady in a narrow tandem stance.  She will benefit from continued skilled physical therapy in order to provide strengthening and balance training.   OBJECTIVE IMPAIRMENTS: Abnormal gait, decreased activity tolerance,  decreased balance, decreased coordination, decreased knowledge of use of DME, decreased mobility, difficulty walking, decreased ROM, decreased strength, decreased safety awareness, increased fascial restrictions, improper body mechanics, postural dysfunction, and pain.    ACTIVITY LIMITATIONS: carrying, lifting, bending, standing, squatting, sleeping, stairs, transfers, bed mobility, and locomotion level   PARTICIPATION LIMITATIONS: meal prep, cleaning, laundry, shopping, community activity, and church   PERSONAL FACTORS: Age and 1-2 comorbidities: osteoporosis, precious back surgeries  are also affecting patient's functional outcome.    REHAB POTENTIAL: Good   CLINICAL DECISION MAKING: Evolving/moderate complexity   EVALUATION COMPLEXITY: Moderate     GOALS:     LONG TERM GOALS: Target date: 07/22/2022     Pt will be I with HEP Baseline: given basic on eval  Goal status: ongoing  2.  Pt will complete balance testing and goal set Baseline: TUG, 22 sec, Berg 42/56 - GOAL is 48/56  Goal status: NEW GOAL    3.  Pt will be able to safely Sit to stand without compensations in 15 sec or less  Baseline: Increased postural sway from elevated mat table.  Needs close supervision due to momentum when rising.  Goal status: ongoing    4.  Pt will be able to ambulate with cane without cues and mod I , 300 feet while carrying handbag  Baseline: mod cues , min A given on Re-assessment (6/24) Goal status:ongoing    5. Pt will not be limited by pain in her back for home tasks  Baseline:  Goal status:ongoing      PLAN:   PT FREQUENCY: 1-2x/week   PT DURATION: 6 weeks   PLANNED INTERVENTIONS: Therapeutic exercises, Therapeutic activity, Neuromuscular re-education, Balance training, Gait training, Patient/Family education, Self Care, Stair training, DME instructions, Cryotherapy, Moist heat, Manual therapy, and Re-evaluation.   PLAN FOR NEXT SESSION: balance work, obstacles , HEP, more  hip stab and stretches/ manual to reduce pain. Give green band     Karie Mainland, PT 07/19/22 11:59 AM Phone: [phone] Fax: [phone]

## 2022-07-19 ENCOUNTER — Ambulatory Visit: Payer: Medicare Other | Admitting: Physical Therapy

## 2022-07-19 ENCOUNTER — Encounter: Payer: Self-pay | Admitting: Physical Therapy

## 2022-07-19 DIAGNOSIS — M5459 Other low back pain: Secondary | ICD-10-CM

## 2022-07-19 DIAGNOSIS — M5442 Lumbago with sciatica, left side: Secondary | ICD-10-CM | POA: Diagnosis not present

## 2022-07-19 DIAGNOSIS — R293 Abnormal posture: Secondary | ICD-10-CM | POA: Diagnosis not present

## 2022-07-19 DIAGNOSIS — R262 Difficulty in walking, not elsewhere classified: Secondary | ICD-10-CM

## 2022-07-19 DIAGNOSIS — G8929 Other chronic pain: Secondary | ICD-10-CM | POA: Diagnosis not present

## 2022-07-19 DIAGNOSIS — M6281 Muscle weakness (generalized): Secondary | ICD-10-CM | POA: Diagnosis not present

## 2022-07-19 DIAGNOSIS — R2689 Other abnormalities of gait and mobility: Secondary | ICD-10-CM | POA: Diagnosis not present

## 2022-07-19 DIAGNOSIS — M25552 Pain in left hip: Secondary | ICD-10-CM | POA: Diagnosis not present

## 2022-07-21 ENCOUNTER — Encounter: Payer: Medicare Other | Admitting: Physical Therapy

## 2022-07-21 DIAGNOSIS — N3020 Other chronic cystitis without hematuria: Secondary | ICD-10-CM | POA: Diagnosis not present

## 2022-07-21 DIAGNOSIS — R350 Frequency of micturition: Secondary | ICD-10-CM | POA: Diagnosis not present

## 2022-07-22 ENCOUNTER — Encounter: Payer: Self-pay | Admitting: Physical Therapy

## 2022-07-22 ENCOUNTER — Ambulatory Visit: Payer: Medicare Other | Admitting: Physical Therapy

## 2022-07-22 DIAGNOSIS — R262 Difficulty in walking, not elsewhere classified: Secondary | ICD-10-CM | POA: Diagnosis not present

## 2022-07-22 DIAGNOSIS — G8929 Other chronic pain: Secondary | ICD-10-CM | POA: Diagnosis not present

## 2022-07-22 DIAGNOSIS — M25552 Pain in left hip: Secondary | ICD-10-CM | POA: Diagnosis not present

## 2022-07-22 DIAGNOSIS — R2689 Other abnormalities of gait and mobility: Secondary | ICD-10-CM | POA: Diagnosis not present

## 2022-07-22 DIAGNOSIS — M6281 Muscle weakness (generalized): Secondary | ICD-10-CM

## 2022-07-22 DIAGNOSIS — R293 Abnormal posture: Secondary | ICD-10-CM | POA: Diagnosis not present

## 2022-07-22 DIAGNOSIS — M5459 Other low back pain: Secondary | ICD-10-CM

## 2022-07-22 DIAGNOSIS — M5442 Lumbago with sciatica, left side: Secondary | ICD-10-CM | POA: Diagnosis not present

## 2022-07-22 NOTE — Therapy (Signed)
OUTPATIENT PHYSICAL THERAPY TREATMENT NOTE   Patient Name: Danielle Hernandez MRN: [id number] DOB:Aug 04, 1935, 87 y.o., female Today's Date: 07/22/2022  PCP: Dr. Lupita Raider   REFERRING PROVIDER: Tressie Stalker, MD  END OF SESSION:   PT End of Session - 07/22/22 1143     Visit Number 7    Number of Visits 24    Date for PT Re-Evaluation 08/16/22    Authorization Type [employer]    Progress Note Due on Visit 10    PT Start Time 1145    PT Stop Time 1225    PT Time Calculation (min) 40 min               Past Medical History:  Diagnosis Date   Arthritis    Headache(784.0)    HTN (hypertension)    Osteoporosis    Raynaud disease    Past Surgical History:  Procedure Laterality Date   ABDOMINAL HYSTERECTOMY     BACK SURGERY     EYE SURGERY     Bilateral cataracts   LUMBAR LAMINECTOMY/DECOMPRESSION MICRODISCECTOMY Bilateral 11/02/2021   Procedure: T12-L1, L1-L2 LAM,FORAM;  Surgeon: Tressie Stalker, MD;  Location: [facility] OR;  Service: Neurosurgery;  Laterality: Bilateral;  3C   TONSILLECTOMY     under chin cyst removed     WRIST GANGLION EXCISION     right hand   Patient Active Problem List   Diagnosis Date Noted   Myelopathy concurrent with and due to spinal stenosis of thoracic region (HCC) 11/02/2021   CDNH (chondrodermatitis nodularis helicis), right 02/21/2018   Impacted cerumen of left ear 03/04/2016   Fecal incontinence 04/26/2012   Lumbar stenosis 02/08/2011    REFERRING DIAG: spinal stenosis s/p L1-L2 laminotomy /foraminotomy   THERAPY DIAG:  Difficulty in walking, not elsewhere classified  Other low back pain  Muscle weakness (generalized)  Rationale for Evaluation and Treatment Rehabilitation  PERTINENT HISTORY:  Osteoporosis  Arthritis Raynaud HTN 11/02/21: T12-L1, L1-L2 Laminectomy, decompression, microdisc.  L shoulder fx and L wrist fx s/p falls  2013: 3 level PLIF   PRECAUTIONS: Fall  SUBJECTIVE:                                                                                                                                                                                       SUBJECTIVE STATEMENT: Ankle pain kept me up last night. Pain is 8/10 in lower back today.   PAIN:  Are you having pain? Yes: NPRS scale: 8/10 Pain location: back of  L leg mostly  Pain description: stiff, achy, weak Aggravating factors: getting out of bed  Relieving factors: sitting to do her housework  OBJECTIVE: (objective measures completed at initial evaluation unless otherwise dated)   DIAGNOSTIC FINDINGS:  None recent    PATIENT SURVEYS:  FOTO 54% 07/05/22   FOTO     07/19/22  54%    SCREENING FOR RED FLAGS: Bowel or bladder incontinence: Yes: premorbid Spinal tumors: No Cauda equina syndrome: No Compression fracture: No Abdominal aneurysm: No   COGNITION: Overall cognitive status: Within functional limits for tasks assessed                          SENSATION: WFL   MUSCLE LENGTH: Hamstrings: tight Thomas test: NT    POSTURE: rounded shoulders, forward head, and left pelvic obliquity High L hip , leans Rt    PALPATION: TTP Lt. lumbar    LUMBAR ROM:    AROM eval  Flexion deferred  Extension    Right lateral flexion    Left lateral flexion    Right rotation 25%  Left rotation 25%   (Blank rows = not tested)   LOWER EXTREMITY ROM:      Passive  Right eval Left  eval  Hip flexion WNL WNL   Hip extension      Hip abduction      Hip adduction      Hip internal rotation 15-20 deg 25 deg   Hip external rotation 25 deg  30 deg   Knee flexion WNL WNL   Knee extension      Ankle dorsiflexion stiff stiff  Ankle plantarflexion      Ankle inversion      Ankle eversion       (Blank rows = not tested)   LOWER EXTREMITY MMT:     MMT Right eval Left eval 07/12/22 right 07/12/22 left  Hip flexion 4 4+    Hip extension        Hip abduction     3 3-  Hip adduction        Hip internal rotation        Hip  external rotation        Knee flexion 4 4    Knee extension 4 4    Ankle dorsiflexion 4+ 3+    Ankle plantarflexion        Ankle inversion        Ankle eversion         (Blank rows = not tested)   LUMBAR SPECIAL TESTS:  NT    FUNCTIONAL TESTS:  5 times sit to stand: 10.3 30 seconds chair stand test 9 reps, max cues to avoid using back of legs on the mat  07/05/22: TUG 22.19 sec with SPC 07/19/22: Sharlene Motts 42/56   GAIT: Distance walked: 150 Assistive device utilized: Single point cane and without Level of assistance: Min A pt needs close supervision with cane, min A without  Comments: foot catches on the floor, min A to recover balance   TODAY'S TREATMENT:            [facility] Adult PT Treatment:                                                DATE: 07/22/22 Therapeutic Exercise: Nustep L4 UE/LE x 5 minutes  Supine nerve flossing LLE assisted by PTA , 90/90 knee flex/ ext, knee ext with ankle pumps SKTC with opp  knee ext LTR with overpressure from PTA Bridge 10 x 2  S/L clam to fatigue bilat  S/L hip abdct to fatigue bilat -assist with left  Neuro reeducation:  Tandem stance trials  Narrow stance with head turns AIREX narrow stance  Therapeutic Activity: Side stepping and retro stepping at counter    [facility] Adult PT Treatment:                                                DATE: 07/19/22 Therapeutic Exercise: Seated hamstring stretch with step 30 sec  x 3  Sit to stand x 10 no UE's  5 X STS 13.5 sec elevated mat  Green x 15 bilateral clam  Bridge x 15 with band  Hamstring stretch 30 sec x 3  Active hamstring stretch LLE x 10  LTR x 10   Therapeutic Activity: BERG BALANCE Sitting to Standing: Numbers; 0-4: 3 Standing unsupported: Numbers; 0-4: 4 Sitting unsupported: Numbers; 0-4: 4 Standing to Sitting: Numbers; 0-4: 4 Transfers: Numbers; 0-4: 4 Standing with eyes closed: Numbers; 0-4: 4 Standing with feet together: Numbers; 0-4: 4 Reaching forward with outstretched arm:  Numbers; 0-4: 3 Retrieving object from the floor: Numbers; 0-4: 4 Turning to look behind: Numbers; 0-4: 2 Turning 360 degrees: Numbers; 0-4: 2 Place alternate foot on stool: Numbers; 0-4: 1 Standing with one foot in front: Numbers; 0-4: 2 Standing on one foot: Numbers; 0-4: 1  Total Score: 42/56  Self Care: Fall prevention at night, lighting, rugs, use of cane for support                                                                                                                     [facility] Adult PT Treatment:                                                DATE: 07/14/22 Therapeutic Exercise: Prone lying over pillows - for manual work, prone h/s curls alternating Green band clam alternating and bilateral  Supine march with green band  Banded bridge x 10  Ball squeeze x 10  Bridge with ball squeeze  PPT Ball under sacrum- pelvic tilts - limited rom- clam AROM  LTR   Therapeutic Activity: Prone- side lying- sit , needs verbal cues and assist Manual Therapy Prone gentle passive hip ER/IR, quad stretch, STW to left piriformis, glutea, hamstring     [facility] Adult PT Treatment:                                                DATE: 07/12/22 Therapeutic Exercise: LTR Figure 4  push  Hamstring stretch with strap  Supine green band clam  Banded bridge green  Hooklying ball squeeze  STS x 10 Heel raises x 10  Tandem stance 10- 15 sec  Standing hip abduction - min cues for technique  Side lying clam x 10- mod cues for form  Sidelying hip abduction- unable to lift on left without assist.    [facility] Adult PT Treatment:                                                DATE: 07/07/22 Therapeutic Exercise: STS x 10, bariatric chair Bridge 10 x 2  Hooklying ball squeeze 5 sec x 10  Bridge with ball squeeze x 3  Supine hamstring stretch with strap x 2 each  Supine clam with red band 10 x 2  Banded bridge red x 10  LTR x 10  Figure 4 push and pull Left  and right -tight bilat Manual  Therapy: A/P hip mobs grade 2  LAD left hip 10 sec x 3  Therapeutic Activity:  Gait with SPC 150 feet, 75 feet, frequent cues    [facility] Adult PT Treatment:                                                DATE: 07/05/22 Therapeutic Exercise: SKTC LTR Bridge 10 x 2  Supine clam red band 10 x 2  Therapeutic Activity: TUG STS x 10 -cues to reach for weight shifting FOTO Gait with SPC Gait in parallel bars using only right UE    DATE: 06/10/22      PATIENT EDUCATION:  Education details: PT, POC, HEP, sit to stand mechanics, balance, cane  Person educated: Patient Education method: Programmer, multimedia, Demonstration, Verbal cues, and Handouts Education comprehension: verbalized understanding and needs further education   HOME EXERCISE PROGRAM: Access Code: [email or web address] URL: [email or web address] Date: 06/10/2022 Prepared by: Karie Mainland   Exercises - Supine Lower Trunk Rotation  - 1 x daily - 7 x weekly - 2 sets - 10 reps - 10 hold - Hooklying Single Knee to Chest Stretch  - 1 x daily - 7 x weekly - 1 sets - 10 reps - 10 hold - Sit to Stand with Counter Support  - 3 x daily - 7 x weekly - 2 sets - 10 reps - 5 hold 07/05/22 - Supine Bridge  - 1 x daily - 7 x weekly - 1-2 sets - 10 reps - 3-5 hold - Hooklying Clamshell with Resistance  - 2 x daily - 7 x weekly - 1-2 sets - 10 reps - slow hold 07/12/22 - Standing Tandem Balance with Counter Support  - 1 x daily - 7 x weekly - 1 sets - 3 reps - 10-15 hold - Standing Hip Abduction with Counter Support  - 1 x daily - 7 x weekly - 2 sets - 10 reps -Hamstring stretch    ASSESSMENT:   CLINICAL IMPRESSION: Patient continues to have left posterior hip pain and significant Trendelenburg causing instability with gait.  She continues to need cues for sequencing with SPC. Today we worked on hip stability and strengthening. Also dynamic gait and balance. She will benefit from continued skilled physical therapy in order to provide  strengthening and balance training.   OBJECTIVE IMPAIRMENTS: Abnormal gait, decreased activity tolerance, decreased balance, decreased coordination, decreased knowledge of use of DME, decreased mobility, difficulty walking, decreased ROM, decreased strength, decreased safety awareness, increased fascial restrictions, improper body mechanics, postural dysfunction, and pain.    ACTIVITY LIMITATIONS: carrying, lifting, bending, standing, squatting, sleeping, stairs, transfers, bed mobility, and locomotion level   PARTICIPATION LIMITATIONS: meal prep, cleaning, laundry, shopping, community activity, and church   PERSONAL FACTORS: Age and 1-2 comorbidities: osteoporosis, precious back surgeries  are also affecting patient's functional outcome.    REHAB POTENTIAL: Good   CLINICAL DECISION MAKING: Evolving/moderate complexity   EVALUATION COMPLEXITY: Moderate     GOALS:     LONG TERM GOALS: Target date: 07/22/2022     Pt will be I with HEP Baseline: given basic on eval  Goal status: ongoing  2.  Pt will complete balance testing and goal set Baseline: TUG, 22 sec, Berg 42/56 - GOAL is 48/56  Goal status: NEW GOAL    3.  Pt will be able to safely Sit to stand without compensations in 15 sec or less  Baseline: Increased postural sway from elevated mat table.  Needs close supervision due to momentum when rising.  Goal status: ongoing    4.  Pt will be able to ambulate with cane without cues and mod I , 300 feet while carrying handbag  Baseline: mod cues , min A given on Re-assessment (6/24) Goal status:ongoing    5. Pt will not be limited by pain in her back for home tasks  Baseline:  Goal status:ongoing      PLAN:   PT FREQUENCY: 1-2x/week   PT DURATION: 6 weeks   PLANNED INTERVENTIONS: Therapeutic exercises, Therapeutic activity, Neuromuscular re-education, Balance training, Gait training, Patient/Family education, Self Care, Stair training, DME instructions, Cryotherapy,  Moist heat, Manual therapy, and Re-evaluation.   PLAN FOR NEXT SESSION: balance work, obstacles , HEP, more hip stab and stretches/ manual to reduce pain.     Jannette Spanner, PTA 07/22/22 12:50 PM Phone: [phone] Fax: [phone]

## 2022-07-23 NOTE — Therapy (Unsigned)
OUTPATIENT PHYSICAL THERAPY TREATMENT NOTE   Patient Name: Danielle Hernandez MRN: [id number] DOB:[birth year], 87 y.o., female Today's Date: 07/26/2022  PCP: Dr. Lupita Raider   REFERRING PROVIDER: Tressie Stalker, MD  END OF SESSION:   PT End of Session - 07/26/22 1056     Visit Number 8    Number of Visits 24    Date for PT Re-Evaluation 08/16/22    Authorization Type [employer]    PT Start Time 1100    PT Stop Time 1145    PT Time Calculation (min) 45 min    Activity Tolerance Patient tolerated treatment well    Behavior During Therapy WFL for tasks assessed/performed                Past Medical History:  Diagnosis Date   Arthritis    Headache(784.0)    HTN (hypertension)    Osteoporosis    Raynaud disease    Past Surgical History:  Procedure Laterality Date   ABDOMINAL HYSTERECTOMY     BACK SURGERY     EYE SURGERY     Bilateral cataracts   LUMBAR LAMINECTOMY/DECOMPRESSION MICRODISCECTOMY Bilateral 11/02/2021   Procedure: T12-L1, L1-L2 LAM,FORAM;  Surgeon: Tressie Stalker, MD;  Location: [facility] OR;  Service: Neurosurgery;  Laterality: Bilateral;  3C   TONSILLECTOMY     under chin cyst removed     WRIST GANGLION EXCISION     right hand   Patient Active Problem List   Diagnosis Date Noted   Myelopathy concurrent with and due to spinal stenosis of thoracic region (HCC) 11/02/2021   CDNH (chondrodermatitis nodularis helicis), right 02/21/2018   Impacted cerumen of left ear 03/04/2016   Fecal incontinence 04/26/2012   Lumbar stenosis 02/08/2011    REFERRING DIAG: spinal stenosis s/p L1-L2 laminotomy /foraminotomy   THERAPY DIAG:  Difficulty in walking, not elsewhere classified  Other low back pain  Muscle weakness (generalized)  Abnormality of gait due to impairment of balance  Rationale for Evaluation and Treatment Rehabilitation  PERTINENT HISTORY:  Osteoporosis  Arthritis Raynaud HTN 11/02/21: T12-L1, L1-L2 Laminectomy, decompression,  microdisc.  L shoulder fx and L wrist fx s/p falls  2013: 3 level PLIF   PRECAUTIONS: Fall  SUBJECTIVE:                                                                                                                                                                                      SUBJECTIVE STATEMENT: I always have pain.  Lower back across both sides.      PAIN:  Are you having pain? Yes: NPRS scale: 5/10 Pain location: back of  L leg mostly  Pain description:  stiff, achy, weak Aggravating factors: getting out of bed  Relieving factors: sitting to do her housework    OBJECTIVE: (objective measures completed at initial evaluation unless otherwise dated)   DIAGNOSTIC FINDINGS:  None recent    PATIENT SURVEYS:  FOTO 54% 07/05/22   FOTO     07/19/22  54%    SCREENING FOR RED FLAGS: Bowel or bladder incontinence: Yes: premorbid Spinal tumors: No Cauda equina syndrome: No Compression fracture: No Abdominal aneurysm: No   COGNITION: Overall cognitive status: Within functional limits for tasks assessed                          SENSATION: WFL   MUSCLE LENGTH: Hamstrings: tight Thomas test: NT    POSTURE: rounded shoulders, forward head, and left pelvic obliquity High L hip , leans Rt    PALPATION: TTP Lt. lumbar    LUMBAR ROM:    AROM eval  Flexion deferred  Extension    Right lateral flexion    Left lateral flexion    Right rotation 25%  Left rotation 25%   (Blank rows = not tested)   LOWER EXTREMITY ROM:      Passive  Right eval Left  eval  Hip flexion WNL WNL   Hip extension      Hip abduction      Hip adduction      Hip internal rotation 15-20 deg 25 deg   Hip external rotation 25 deg  30 deg   Knee flexion WNL WNL   Knee extension      Ankle dorsiflexion stiff stiff  Ankle plantarflexion      Ankle inversion      Ankle eversion       (Blank rows = not tested)   LOWER EXTREMITY MMT:     MMT Right eval Left eval 07/12/22 right  07/12/22 left  Hip flexion 4 4+    Hip extension        Hip abduction     3 3-  Hip adduction        Hip internal rotation        Hip external rotation        Knee flexion 4 4    Knee extension 4 4    Ankle dorsiflexion 4+ 3+    Ankle plantarflexion        Ankle inversion        Ankle eversion         (Blank rows = not tested)   LUMBAR SPECIAL TESTS:  NT    FUNCTIONAL TESTS:  5 times sit to stand: 10.3 30 seconds chair stand test 9 reps, max cues to avoid using back of legs on the mat  07/05/22: TUG 22.19 sec with SPC 07/19/22: Berg 42/56   GAIT: Distance walked: 150 Assistive device utilized: Single point cane and without Level of assistance: Min A pt needs close supervision with cane, min A without  Comments: foot catches on the floor, min A to recover balance   TODAY'S TREATMENT:        [facility] Adult PT Treatment:                                                DATE: 07/26/22 Therapeutic Exercise: Standing there ex: heel raises x 15 Squat x 10 decr  WB on Rt LE  Airex pad: static hold, eyes open, eyes closed High knee marching, tandem stance with min A needed, 1 UE  Hamstring stretching, 3 way with strap Green looped band around thighs x 15  Bridge with band  x 10  Piriformis knee crossing over to opp thigh  LTR knees crossed  Manual Therapy: Sidelying manual to L post hip            [facility] Adult PT Treatment:                                                DATE: 07/22/22 Therapeutic Exercise: Nustep L4 UE/LE x 5 minutes  Supine nerve flossing LLE assisted by PTA , 90/90 knee flex/ ext, knee ext with ankle pumps SKTC with opp knee ext LTR with overpressure from PTA Bridge 10 x 2  S/L clam to fatigue bilat  S/L hip abdct to fatigue bilat -assist with left  Neuro reeducation:  Tandem stance trials  Narrow stance with head turns AIREX narrow stance  Therapeutic Activity: Side stepping and retro stepping at counter    [facility] Adult PT Treatment:                                                 DATE: 07/19/22 Therapeutic Exercise: Seated hamstring stretch with step 30 sec  x 3  Sit to stand x 10 no UE's  5 X STS 13.5 sec elevated mat  Green x 15 bilateral clam  Bridge x 15 with band  Hamstring stretch 30 sec x 3  Active hamstring stretch LLE x 10  LTR x 10   Therapeutic Activity: BERG BALANCE Sitting to Standing: Numbers; 0-4: 3 Standing unsupported: Numbers; 0-4: 4 Sitting unsupported: Numbers; 0-4: 4 Standing to Sitting: Numbers; 0-4: 4 Transfers: Numbers; 0-4: 4 Standing with eyes closed: Numbers; 0-4: 4 Standing with feet together: Numbers; 0-4: 4 Reaching forward with outstretched arm: Numbers; 0-4: 3 Retrieving object from the floor: Numbers; 0-4: 4 Turning to look behind: Numbers; 0-4: 2 Turning 360 degrees: Numbers; 0-4: 2 Place alternate foot on stool: Numbers; 0-4: 1 Standing with one foot in front: Numbers; 0-4: 2 Standing on one foot: Numbers; 0-4: 1  Total Score: 42/56  Self Care: Fall prevention at night, lighting, rugs, use of cane for support                                                                                                                     [facility] Adult PT Treatment:  DATE: 07/14/22 Therapeutic Exercise: Prone lying over pillows - for manual work, prone h/s curls alternating Green band clam alternating and bilateral  Supine march with green band  Banded bridge x 10  Ball squeeze x 10  Bridge with ball squeeze  PPT Ball under sacrum- pelvic tilts - limited rom- clam AROM  LTR   Therapeutic Activity: Prone- side lying- sit , needs verbal cues and assist Manual Therapy Prone gentle passive hip ER/IR, quad stretch, STW to left piriformis, glutea, hamstring     [facility] Adult PT Treatment:                                                DATE: 07/12/22 Therapeutic Exercise: LTR Figure 4 push  Hamstring stretch with strap  Supine green band clam  Banded bridge  green  Hooklying ball squeeze  STS x 10 Heel raises x 10  Tandem stance 10- 15 sec  Standing hip abduction - min cues for technique  Side lying clam x 10- mod cues for form  Sidelying hip abduction- unable to lift on left without assist.    [facility] Adult PT Treatment:                                                DATE: 07/07/22 Therapeutic Exercise: STS x 10, bariatric chair Bridge 10 x 2  Hooklying ball squeeze 5 sec x 10  Bridge with ball squeeze x 3  Supine hamstring stretch with strap x 2 each  Supine clam with red band 10 x 2  Banded bridge red x 10  LTR x 10  Figure 4 push and pull Left  and right -tight bilat Manual Therapy: A/P hip mobs grade 2  LAD left hip 10 sec x 3  Therapeutic Activity:  Gait with SPC 150 feet, 75 feet, frequent cues    [facility] Adult PT Treatment:                                                DATE: 07/05/22 Therapeutic Exercise: SKTC LTR Bridge 10 x 2  Supine clam red band 10 x 2  Therapeutic Activity: TUG STS x 10 -cues to reach for weight shifting FOTO Gait with SPC Gait in parallel bars using only right UE    DATE: 06/10/22      PATIENT EDUCATION:  Education details: PT, POC, HEP, sit to stand mechanics, balance, cane  Person educated: Patient Education method: Programmer, multimedia, Demonstration, Verbal cues, and Handouts Education comprehension: verbalized understanding and needs further education   HOME EXERCISE PROGRAM: Access Code: [email or web address] URL: [email or web address] Date: 06/10/2022 Prepared by: Karie Mainland   Access Code: [email or web address] URL: [email or web address] Date: 07/26/2022 Prepared by: Karie Mainland  Exercises - Supine Lower Trunk Rotation  - 1 x daily - 7 x weekly - 2 sets - 10 reps - 10 hold - Hooklying Single Knee to Chest Stretch  - 1 x daily - 7 x weekly - 1 sets - 10 reps - 10 hold - Sit to Stand with Counter Support  - 3 x daily - 7 x weekly - 2  sets - 10 reps - 5 hold - Supine Bridge  - 1 x  daily - 7 x weekly - 1-2 sets - 10 reps - 3-5 hold - Hooklying Clamshell with Resistance  - 2 x daily - 7 x weekly - 1-2 sets - 10 reps - slow hold - Standing Tandem Balance with Counter Support  - 1 x daily - 7 x weekly - 1 sets - 3 reps - 10-15 hold - Standing Hip Abduction with Counter Support  - 1 x daily - 7 x weekly - 2 sets - 10 reps - Seated Hamstring Stretch  - 1 x daily - 7 x weekly - 1 sets - 5 reps - 30 hold - Supine Piriformis Stretch  - 1 x daily - 7 x weekly - 1 sets - 3 reps - 30 hold - Seated Figure 4 Piriformis Stretch  - 1 x daily - 7 x weekly - 1 sets - 3 reps - 30 hold   ASSESSMENT:   CLINICAL IMPRESSION: Patient with notable turnout of her LLE with gait with L pelvic instability.  Able to target this area with specific stretches today in multiple ways.  She continued to have the discomfort as she walked out.  She continues to demonstrate decreased safety awareness and inattention to mobility tasks. She will continue to benefi from skilled PT and reinforcement of concepts of mobility, exercise and balance.    OBJECTIVE IMPAIRMENTS: Abnormal gait, decreased activity tolerance, decreased balance, decreased coordination, decreased knowledge of use of DME, decreased mobility, difficulty walking, decreased ROM, decreased strength, decreased safety awareness, increased fascial restrictions, improper body mechanics, postural dysfunction, and pain.    ACTIVITY LIMITATIONS: carrying, lifting, bending, standing, squatting, sleeping, stairs, transfers, bed mobility, and locomotion level   PARTICIPATION LIMITATIONS: meal prep, cleaning, laundry, shopping, community activity, and church   PERSONAL FACTORS: Age and 1-2 comorbidities: osteoporosis, precious back surgeries  are also affecting patient's functional outcome.    REHAB POTENTIAL: Good   CLINICAL DECISION MAKING: Evolving/moderate complexity   EVALUATION COMPLEXITY: Moderate     GOALS:     LONG TERM GOALS: Target date:  07/22/2022     Pt will be I with HEP Baseline: given basic on eval  Goal status: ongoing  2.  Pt will complete balance testing and goal set Baseline: TUG, 22 sec, Berg 42/56 - GOAL is 48/56  Goal status: NEW GOAL    3.  Pt will be able to safely Sit to stand without compensations in 15 sec or less  Baseline: Increased postural sway from elevated mat table.  Needs close supervision due to momentum when rising.  Goal status: ongoing    4.  Pt will be able to ambulate with cane without cues and mod I , 300 feet while carrying handbag  Baseline: mod cues , min A given on Re-assessment (6/24) Goal status:ongoing    5. Pt will not be limited by pain in her back for home tasks  Baseline:  Goal status:ongoing      PLAN:   PT FREQUENCY: 1-2x/week   PT DURATION: 6 weeks   PLANNED INTERVENTIONS: Therapeutic exercises, Therapeutic activity, Neuromuscular re-education, Balance training, Gait training, Patient/Family education, Self Care, Stair training, DME instructions, Cryotherapy, Moist heat, Manual therapy, and Re-evaluation.   PLAN FOR NEXT SESSION: balance work, obstacles , HEP, more hip stab and stretches/ manual to reduce pain.    Karie Mainland, PT 07/26/22 11:57 AM Phone: [phone] Fax: [phone]

## 2022-07-26 ENCOUNTER — Encounter: Payer: Self-pay | Admitting: Physical Therapy

## 2022-07-26 ENCOUNTER — Ambulatory Visit: Payer: Medicare Other | Attending: Neurosurgery | Admitting: Physical Therapy

## 2022-07-26 DIAGNOSIS — M5442 Lumbago with sciatica, left side: Secondary | ICD-10-CM | POA: Insufficient documentation

## 2022-07-26 DIAGNOSIS — M25552 Pain in left hip: Secondary | ICD-10-CM | POA: Insufficient documentation

## 2022-07-26 DIAGNOSIS — R293 Abnormal posture: Secondary | ICD-10-CM | POA: Diagnosis not present

## 2022-07-26 DIAGNOSIS — R262 Difficulty in walking, not elsewhere classified: Secondary | ICD-10-CM | POA: Insufficient documentation

## 2022-07-26 DIAGNOSIS — M5459 Other low back pain: Secondary | ICD-10-CM | POA: Diagnosis not present

## 2022-07-26 DIAGNOSIS — G8929 Other chronic pain: Secondary | ICD-10-CM | POA: Diagnosis not present

## 2022-07-26 DIAGNOSIS — R2689 Other abnormalities of gait and mobility: Secondary | ICD-10-CM | POA: Diagnosis not present

## 2022-07-26 DIAGNOSIS — M6281 Muscle weakness (generalized): Secondary | ICD-10-CM | POA: Insufficient documentation

## 2022-07-30 NOTE — Therapy (Signed)
OUTPATIENT PHYSICAL THERAPY TREATMENT NOTE   Patient Name: Danielle Hernandez MRN: [id number] DOB:13-Dec-1935, 87 y.o., female Today's Date: 08/02/2022  PCP: Dr. Lupita Raider   REFERRING PROVIDER: Tressie Stalker, MD  END OF SESSION:   PT End of Session - 08/02/22 1106     Visit Number 9    Number of Visits 24    Date for PT Re-Evaluation 08/16/22    Authorization Type [employer] Medicare    PT Start Time 1104    PT Stop Time 1145    PT Time Calculation (min) 41 min    Activity Tolerance Patient tolerated treatment well    Behavior During Therapy WFL for tasks assessed/performed                 Past Medical History:  Diagnosis Date   Arthritis    Headache(784.0)    HTN (hypertension)    Osteoporosis    Raynaud disease    Past Surgical History:  Procedure Laterality Date   ABDOMINAL HYSTERECTOMY     BACK SURGERY     EYE SURGERY     Bilateral cataracts   LUMBAR LAMINECTOMY/DECOMPRESSION MICRODISCECTOMY Bilateral 11/02/2021   Procedure: T12-L1, L1-L2 LAM,FORAM;  Surgeon: Tressie Stalker, MD;  Location: [facility] OR;  Service: Neurosurgery;  Laterality: Bilateral;  3C   TONSILLECTOMY     under chin cyst removed     WRIST GANGLION EXCISION     right hand   Patient Active Problem List   Diagnosis Date Noted   Myelopathy concurrent with and due to spinal stenosis of thoracic region (HCC) 11/02/2021   CDNH (chondrodermatitis nodularis helicis), right 02/21/2018   Impacted cerumen of left ear 03/04/2016   Fecal incontinence 04/26/2012   Lumbar stenosis 02/08/2011    REFERRING DIAG: spinal stenosis s/p L1-L2 laminotomy /foraminotomy   THERAPY DIAG:  Difficulty in walking, not elsewhere classified  Other low back pain  Muscle weakness (generalized)  Abnormality of gait due to impairment of balance  Rationale for Evaluation and Treatment Rehabilitation  PERTINENT HISTORY:  Osteoporosis  Arthritis Raynaud HTN 11/02/21: T12-L1, L1-L2 Laminectomy, decompression,  microdisc.  L shoulder fx and L wrist fx s/p falls  2013: 3 level PLIF   PRECAUTIONS: Fall  SUBJECTIVE:                                                                                                                                                                                      SUBJECTIVE STATEMENT: My back is better than it was yesterday.  Still some pain in her L hamstring mostly at night     PAIN:  Are you having pain? Yes: NPRS scale: 6/10 back.  L hamstring/hip 8/10.  Pain location: back of  L leg mostly  Pain description: stiff, achy, weak Aggravating factors: getting out of bed  Relieving factors: sitting to do her housework    OBJECTIVE: (objective measures completed at initial evaluation unless otherwise dated)   DIAGNOSTIC FINDINGS:  None recent    PATIENT SURVEYS:  FOTO 54% 07/05/22   FOTO     07/19/22  54%    SCREENING FOR RED FLAGS: Bowel or bladder incontinence: Yes: premorbid Spinal tumors: No Cauda equina syndrome: No Compression fracture: No Abdominal aneurysm: No   COGNITION: Overall cognitive status: Within functional limits for tasks assessed                          SENSATION: WFL   MUSCLE LENGTH: Hamstrings: tight Thomas test: NT    POSTURE: rounded shoulders, forward head, and left pelvic obliquity High L hip , leans Rt    PALPATION: TTP Lt. lumbar    LUMBAR ROM:    AROM eval  Flexion deferred  Extension    Right lateral flexion    Left lateral flexion    Right rotation 25%  Left rotation 25%   (Blank rows = not tested)   LOWER EXTREMITY ROM:      Passive  Right eval Left  eval  Hip flexion WNL WNL   Hip extension      Hip abduction      Hip adduction      Hip internal rotation 15-20 deg 25 deg   Hip external rotation 25 deg  30 deg   Knee flexion WNL WNL   Knee extension      Ankle dorsiflexion stiff stiff  Ankle plantarflexion      Ankle inversion      Ankle eversion       (Blank rows = not tested)   LOWER  EXTREMITY MMT:     MMT Right eval Left eval 07/12/22 right 07/12/22 left  Hip flexion 4 4+    Hip extension        Hip abduction     3 3-  Hip adduction        Hip internal rotation        Hip external rotation        Knee flexion 4 4    Knee extension 4 4    Ankle dorsiflexion 4+ 3+    Ankle plantarflexion        Ankle inversion        Ankle eversion         (Blank rows = not tested)   LUMBAR SPECIAL TESTS:  NT    FUNCTIONAL TESTS:  5 times sit to stand: 10.3 30 seconds chair stand test 9 reps, max cues to avoid using back of legs on the mat  07/05/22: TUG 22.19 sec with SPC 07/19/22: Berg 42/56   GAIT: Distance walked: 150 Assistive device utilized: Single point cane and without Level of assistance: Min A pt needs close supervision with cane, min A without  Comments: foot catches on the floor, min A to recover balance   TODAY'S TREATMENT:       [facility] Adult PT Treatment:                                                DATE:  08/02/22 Therapeutic Exercise: Step ups forward and lateral , 4 inch x 20 each LE  Hip abduction x 15  Supine piriformis knee to chest and opposite shoulder Knee extension AROM Figure 4 knee press  Sit to stand with Pilates circle lift for increased trunk extension and coordination  Standing rotation x 10 each side with Pilates circle Therapeutic Activity: Gait 360 feet with cane , cues for sequencing and posture   [facility] Adult PT Treatment:                                                DATE: 07/26/22 Therapeutic Exercise: Standing there ex: heel raises x 15 Squat x 10 decr WB on Rt LE  Airex pad: static hold, eyes open, eyes closed High knee marching, tandem stance with min A needed, 1 UE  Hamstring stretching, 3 way with strap Green looped band around thighs x 15  Bridge with band  x 10  Piriformis knee crossing over to opp thigh  LTR knees crossed  Manual Therapy: Sidelying manual to L post hip            [facility] Adult PT Treatment:                                                 DATE: 07/22/22 Therapeutic Exercise: Nustep L4 UE/LE x 5 minutes  Supine nerve flossing LLE assisted by PTA , 90/90 knee flex/ ext, knee ext with ankle pumps SKTC with opp knee ext LTR with overpressure from PTA Bridge 10 x 2  S/L clam to fatigue bilat  S/L hip abdct to fatigue bilat -assist with left  Neuro reeducation:  Tandem stance trials  Narrow stance with head turns AIREX narrow stance  Therapeutic Activity: Side stepping and retro stepping at counter    [facility] Adult PT Treatment:                                                DATE: 07/19/22 Therapeutic Exercise: Seated hamstring stretch with step 30 sec  x 3  Sit to stand x 10 no UE's  5 X STS 13.5 sec elevated mat  Green x 15 bilateral clam  Bridge x 15 with band  Hamstring stretch 30 sec x 3  Active hamstring stretch LLE x 10  LTR x 10   Therapeutic Activity: BERG BALANCE Sitting to Standing: Numbers; 0-4: 3 Standing unsupported: Numbers; 0-4: 4 Sitting unsupported: Numbers; 0-4: 4 Standing to Sitting: Numbers; 0-4: 4 Transfers: Numbers; 0-4: 4 Standing with eyes closed: Numbers; 0-4: 4 Standing with feet together: Numbers; 0-4: 4 Reaching forward with outstretched arm: Numbers; 0-4: 3 Retrieving object from the floor: Numbers; 0-4: 4 Turning to look behind: Numbers; 0-4: 2 Turning 360 degrees: Numbers; 0-4: 2 Place alternate foot on stool: Numbers; 0-4: 1 Standing with one foot in front: Numbers; 0-4: 2 Standing on one foot: Numbers; 0-4: 1  Total Score: 42/56  Self Care: Fall prevention at night, lighting, rugs, use of cane for support  [facility] Adult PT Treatment:                                                DATE: 07/14/22 Therapeutic Exercise: Prone lying over pillows - for manual work, prone h/s curls alternating Green band clam alternating and bilateral   Supine march with green band  Banded bridge x 10  Ball squeeze x 10  Bridge with ball squeeze  PPT Ball under sacrum- pelvic tilts - limited rom- clam AROM  LTR   Therapeutic Activity: Prone- side lying- sit , needs verbal cues and assist Manual Therapy Prone gentle passive hip ER/IR, quad stretch, STW to left piriformis, glutea, hamstring     [facility] Adult PT Treatment:                                                DATE: 07/12/22 Therapeutic Exercise: LTR Figure 4 push  Hamstring stretch with strap  Supine green band clam  Banded bridge green  Hooklying ball squeeze  STS x 10 Heel raises x 10  Tandem stance 10- 15 sec  Standing hip abduction - min cues for technique  Side lying clam x 10- mod cues for form  Sidelying hip abduction- unable to lift on left without assist.    [facility] Adult PT Treatment:                                                DATE: 07/07/22 Therapeutic Exercise: STS x 10, bariatric chair Bridge 10 x 2  Hooklying ball squeeze 5 sec x 10  Bridge with ball squeeze x 3  Supine hamstring stretch with strap x 2 each  Supine clam with red band 10 x 2  Banded bridge red x 10  LTR x 10  Figure 4 push and pull Left  and right -tight bilat Manual Therapy: A/P hip mobs grade 2  LAD left hip 10 sec x 3  Therapeutic Activity:  Gait with SPC 150 feet, 75 feet, frequent cues    [facility] Adult PT Treatment:                                                DATE: 07/05/22 Therapeutic Exercise: SKTC LTR Bridge 10 x 2  Supine clam red band 10 x 2  Therapeutic Activity: TUG STS x 10 -cues to reach for weight shifting FOTO Gait with SPC Gait in parallel bars using only right UE    DATE: 06/10/22      PATIENT EDUCATION:  Education details: using cane when out of the house, safety with nighttime bathroom trips  Person educated: Patient Education method: Programmer, multimedia, Facilities manager, Verbal cues, and Handouts Education comprehension: verbalized understanding and  needs further education   HOME EXERCISE PROGRAM: Access Code: [email or web address] URL: [email or web address] Date: 06/10/2022 Prepared by: Karie Mainland   Access Code: [email or web address] URL: [email or web address] Date: 07/26/2022 Prepared by: Karie Mainland  Exercises - Supine Lower Trunk Rotation  - 1 x daily -  7 x weekly - 2 sets - 10 reps - 10 hold - Hooklying Single Knee to Chest Stretch  - 1 x daily - 7 x weekly - 1 sets - 10 reps - 10 hold - Sit to Stand with Counter Support  - 3 x daily - 7 x weekly - 2 sets - 10 reps - 5 hold - Supine Bridge  - 1 x daily - 7 x weekly - 1-2 sets - 10 reps - 3-5 hold - Hooklying Clamshell with Resistance  - 2 x daily - 7 x weekly - 1-2 sets - 10 reps - slow hold - Standing Tandem Balance with Counter Support  - 1 x daily - 7 x weekly - 1 sets - 3 reps - 10-15 hold - Standing Hip Abduction with Counter Support  - 1 x daily - 7 x weekly - 2 sets - 10 reps - Seated Hamstring Stretch  - 1 x daily - 7 x weekly - 1 sets - 5 reps - 30 hold - Supine Piriformis Stretch  - 1 x daily - 7 x weekly - 1 sets - 3 reps - 30 hold - Seated Figure 4 Piriformis Stretch  - 1 x daily - 7 x weekly - 1 sets - 3 reps - 30 hold   ASSESSMENT:   CLINICAL IMPRESSION:  Patient continues to need minimal cues with gait for sequencing and trunk lean.  Left Trendelenburg presents and difficult to correct with cueing .  Patient tolerated the session well not limited by pain despite subjective report of high pain levels. Worked mostly in standing for strength and postural education.    OBJECTIVE IMPAIRMENTS: Abnormal gait, decreased activity tolerance, decreased balance, decreased coordination, decreased knowledge of use of DME, decreased mobility, difficulty walking, decreased ROM, decreased strength, decreased safety awareness, increased fascial restrictions, improper body mechanics, postural dysfunction, and pain.    ACTIVITY LIMITATIONS: carrying, lifting, bending,  standing, squatting, sleeping, stairs, transfers, bed mobility, and locomotion level   PARTICIPATION LIMITATIONS: meal prep, cleaning, laundry, shopping, community activity, and church   PERSONAL FACTORS: Age and 1-2 comorbidities: osteoporosis, precious back surgeries  are also affecting patient's functional outcome.    REHAB POTENTIAL: Good   CLINICAL DECISION MAKING: Evolving/moderate complexity   EVALUATION COMPLEXITY: Moderate     GOALS:     LONG TERM GOALS: Target date: 07/22/2022     Pt will be I with HEP Baseline: given basic on eval  Goal status: ongoing  2.  Pt will complete balance testing and goal set Baseline: TUG, 22 sec, Berg 42/56 - GOAL is 48/56  Goal status: NEW GOAL    3.  Pt will be able to safely Sit to stand without compensations in 15 sec or less  Baseline: Increased postural sway from elevated mat table.  Needs close supervision due to momentum when rising.  Goal status: ongoing    4.  Pt will be able to ambulate with cane without cues and mod I , 300 feet while carrying handbag  Baseline: mod cues , min A given on Re-assessment (6/24) Goal status:ongoing    5. Pt will not be limited by pain in her back for home tasks  Baseline:  Goal status:ongoing      PLAN:   PT FREQUENCY: 1-2x/week   PT DURATION: 6 weeks   PLANNED INTERVENTIONS: Therapeutic exercises, Therapeutic activity, Neuromuscular re-education, Balance training, Gait training, Patient/Family education, Self Care, Stair training, DME instructions, Cryotherapy, Moist heat, Manual therapy, and Re-evaluation.   PLAN  FOR NEXT SESSION: balance work, obstacles , HEP, more hip stab and stretches/ manual to reduce pain.    Karie Mainland, PT 08/02/22 11:56 AM Phone: [phone] Fax: [phone]

## 2022-08-02 ENCOUNTER — Encounter: Payer: Self-pay | Admitting: Physical Therapy

## 2022-08-02 ENCOUNTER — Ambulatory Visit: Payer: Medicare Other | Admitting: Physical Therapy

## 2022-08-02 DIAGNOSIS — M25552 Pain in left hip: Secondary | ICD-10-CM | POA: Diagnosis not present

## 2022-08-02 DIAGNOSIS — G8929 Other chronic pain: Secondary | ICD-10-CM | POA: Diagnosis not present

## 2022-08-02 DIAGNOSIS — M6281 Muscle weakness (generalized): Secondary | ICD-10-CM

## 2022-08-02 DIAGNOSIS — R262 Difficulty in walking, not elsewhere classified: Secondary | ICD-10-CM | POA: Diagnosis not present

## 2022-08-02 DIAGNOSIS — R2689 Other abnormalities of gait and mobility: Secondary | ICD-10-CM | POA: Diagnosis not present

## 2022-08-02 DIAGNOSIS — M5459 Other low back pain: Secondary | ICD-10-CM

## 2022-08-02 DIAGNOSIS — M5442 Lumbago with sciatica, left side: Secondary | ICD-10-CM | POA: Diagnosis not present

## 2022-08-02 DIAGNOSIS — R293 Abnormal posture: Secondary | ICD-10-CM | POA: Diagnosis not present

## 2022-08-05 ENCOUNTER — Encounter: Payer: Self-pay | Admitting: Physical Therapy

## 2022-08-05 ENCOUNTER — Ambulatory Visit: Payer: Medicare Other | Admitting: Physical Therapy

## 2022-08-05 DIAGNOSIS — M25552 Pain in left hip: Secondary | ICD-10-CM | POA: Diagnosis not present

## 2022-08-05 DIAGNOSIS — M6281 Muscle weakness (generalized): Secondary | ICD-10-CM | POA: Diagnosis not present

## 2022-08-05 DIAGNOSIS — M5459 Other low back pain: Secondary | ICD-10-CM

## 2022-08-05 DIAGNOSIS — R262 Difficulty in walking, not elsewhere classified: Secondary | ICD-10-CM | POA: Diagnosis not present

## 2022-08-05 DIAGNOSIS — R293 Abnormal posture: Secondary | ICD-10-CM | POA: Diagnosis not present

## 2022-08-05 DIAGNOSIS — R2689 Other abnormalities of gait and mobility: Secondary | ICD-10-CM

## 2022-08-05 DIAGNOSIS — M5442 Lumbago with sciatica, left side: Secondary | ICD-10-CM | POA: Diagnosis not present

## 2022-08-05 DIAGNOSIS — G8929 Other chronic pain: Secondary | ICD-10-CM | POA: Diagnosis not present

## 2022-08-05 NOTE — Therapy (Signed)
OUTPATIENT PHYSICAL THERAPY TREATMENT NOTE   Patient Name: Danielle Hernandez MRN: [id number] DOB:13-Aug-1935, 87 y.o., female Today's Date: 08/05/2022  PCP: Dr. Lupita Raider   REFERRING PROVIDER: Tressie Stalker, MD  END OF SESSION:   PT End of Session - 08/05/22 1112     Visit Number 10    Number of Visits 24    Date for PT Re-Evaluation 08/16/22    Authorization Type [employer] Medicare    PT Start Time 1104    PT Stop Time 1145    PT Time Calculation (min) 41 min    Activity Tolerance Patient tolerated treatment well    Behavior During Therapy [facility] for tasks assessed/performed               Progress Note Reporting Period 06/10/22 to 08/05/22  See note below for Objective Data and Assessment of Progress/Goals.       Past Medical History:  Diagnosis Date   Arthritis    Headache(784.0)    HTN (hypertension)    Osteoporosis    Raynaud disease    Past Surgical History:  Procedure Laterality Date   ABDOMINAL HYSTERECTOMY     BACK SURGERY     EYE SURGERY     Bilateral cataracts   LUMBAR LAMINECTOMY/DECOMPRESSION MICRODISCECTOMY Bilateral 11/02/2021   Procedure: T12-L1, L1-L2 LAM,FORAM;  Surgeon: Tressie Stalker, MD;  Location: [facility] OR;  Service: Neurosurgery;  Laterality: Bilateral;  3C   TONSILLECTOMY     under chin cyst removed     WRIST GANGLION EXCISION     right hand   Patient Active Problem List   Diagnosis Date Noted   Myelopathy concurrent with and due to spinal stenosis of thoracic region (HCC) 11/02/2021   CDNH (chondrodermatitis nodularis helicis), right 02/21/2018   Impacted cerumen of left ear 03/04/2016   Fecal incontinence 04/26/2012   Lumbar stenosis 02/08/2011    REFERRING DIAG: spinal stenosis s/p L1-L2 laminotomy /foraminotomy   THERAPY DIAG:  Difficulty in walking, not elsewhere classified  Other low back pain  Muscle weakness (generalized)  Abnormality of gait due to impairment of balance  Rationale for Evaluation and Treatment  Rehabilitation  PERTINENT HISTORY:  Osteoporosis  Arthritis Raynaud HTN 11/02/21: T12-L1, L1-L2 Laminectomy, decompression, microdisc.  L shoulder fx and L wrist fx s/p falls  2013: 3 level PLIF   PRECAUTIONS: Fall  SUBJECTIVE:                                                                                                                                                                                      SUBJECTIVE STATEMENT: I had a bad weekend, was not able to do much exercise.  I was a bit down. My hip was hurting. Back is about 6/10 right now.     PAIN:  Are you having pain? Yes: NPRS scale: 6/10 back.  L hamstring/hip 6/10.  Pain location: back of  L leg mostly  Pain description: stiff, achy, weak Aggravating factors: getting out of bed  Relieving factors: sitting to do her housework    OBJECTIVE: (objective measures completed at initial evaluation unless otherwise dated)   DIAGNOSTIC FINDINGS:  None recent    PATIENT SURVEYS:  FOTO 54% 07/05/22   FOTO     07/19/22  54%    SCREENING FOR RED FLAGS: Bowel or bladder incontinence: Yes: premorbid Spinal tumors: No Cauda equina syndrome: No Compression fracture: No Abdominal aneurysm: No   COGNITION: Overall cognitive status: Within functional limits for tasks assessed                          SENSATION: WFL   MUSCLE LENGTH: Hamstrings: tight Thomas test: NT    POSTURE: rounded shoulders, forward head, and left pelvic obliquity High L hip , leans Rt    PALPATION: TTP Lt. lumbar    LUMBAR ROM:    AROM eval  Flexion deferred  Extension    Right lateral flexion    Left lateral flexion    Right rotation 25%  Left rotation 25%   (Blank rows = not tested)   LOWER EXTREMITY ROM:      Passive  Right eval Left  eval  Hip flexion WNL WNL   Hip extension      Hip abduction      Hip adduction      Hip internal rotation 15-20 deg 25 deg   Hip external rotation 25 deg  30 deg   Knee flexion WNL WNL    Knee extension      Ankle dorsiflexion stiff stiff  Ankle plantarflexion      Ankle inversion      Ankle eversion       (Blank rows = not tested)   LOWER EXTREMITY MMT:     MMT Right eval Left eval 07/12/22 right 07/12/22 left  Hip flexion 4 4+    Hip extension        Hip abduction     3 3-  Hip adduction        Hip internal rotation        Hip external rotation        Knee flexion 4 4    Knee extension 4 4    Ankle dorsiflexion 4+ 3+    Ankle plantarflexion        Ankle inversion        Ankle eversion         (Blank rows = not tested)   LUMBAR SPECIAL TESTS:  NT    FUNCTIONAL TESTS:  5 times sit to stand: 10.3 30 seconds chair stand test 9 reps, max cues to avoid using back of legs on the mat  07/05/22: TUG 22.19 sec with SPC 07/19/22: Berg 42/56   GAIT: Distance walked: 150 Assistive device utilized: Single point cane and without Level of assistance: Min A pt needs close supervision with cane, min A without  Comments: foot catches on the floor, min A to recover balance   TODAY'S TREATMENT:        [facility] Adult PT Treatment:  DATE: 08/05/22 Therapeutic Exercise: Supine piriformis (knee to opposite shoulder)  Hamstring dynamic  Bridge x 15  SLR x 10 , cues  Standing hip abduction  Hip extension  Shoulder flexion with cane leaning on wall for extension  Sidestepping toes for ward Tandem walking  High march  Heel raises  Narrow stance alternating arms overhead  Tandem stance hold 30 sec , close supervision    [facility] Adult PT Treatment:                                                DATE: 08/02/22 Therapeutic Exercise: Step ups forward and lateral , 4 inch x 20 each LE  Hip abduction x 15  Supine piriformis knee to chest and opposite shoulder Knee extension AROM Figure 4 knee press  Sit to stand with Pilates circle lift for increased trunk extension and coordination  Standing rotation x 10 each side with Pilates  circle Therapeutic Activity: Gait 360 feet with cane , cues for sequencing and posture   [facility] Adult PT Treatment:                                                DATE: 07/26/22 Therapeutic Exercise: Standing there ex: heel raises x 15 Squat x 10 decr WB on Rt LE  Airex pad: static hold, eyes open, eyes closed High knee marching, tandem stance with min A needed, 1 UE  Hamstring stretching, 3 way with strap Green looped band around thighs x 15  Bridge with band  x 10  Piriformis knee crossing over to opp thigh  LTR knees crossed  Manual Therapy: Sidelying manual to L post hip            [facility] Adult PT Treatment:                                                DATE: 07/22/22 Therapeutic Exercise: Nustep L4 UE/LE x 5 minutes  Supine nerve flossing LLE assisted by PTA , 90/90 knee flex/ ext, knee ext with ankle pumps SKTC with opp knee ext LTR with overpressure from PTA Bridge 10 x 2  S/L clam to fatigue bilat  S/L hip abdct to fatigue bilat -assist with left  Neuro reeducation:  Tandem stance trials  Narrow stance with head turns AIREX narrow stance  Therapeutic Activity: Side stepping and retro stepping at counter     DATE: 06/10/22      PATIENT EDUCATION:  Education details: using cane when out of the house, safety with nighttime bathroom trips  Person educated: Patient Education method: Programmer, multimedia, Demonstration, Verbal cues, and Handouts Education comprehension: verbalized understanding and needs further education   HOME EXERCISE PROGRAM: Access Code: [email or web address] URL: [email or web address] Date: 06/10/2022 Prepared by: Karie Mainland   Access Code: [email or web address] URL: [email or web address] Date: 07/26/2022 Prepared by: Karie Mainland  Exercises - Supine Lower Trunk Rotation  - 1 x daily - 7 x weekly - 2 sets - 10 reps - 10 hold - Hooklying Single Knee to Chest Stretch  - 1 x daily - 7 x weekly - 1 sets - 10  reps - 10 hold - Sit to Stand with  Counter Support  - 3 x daily - 7 x weekly - 2 sets - 10 reps - 5 hold - Supine Bridge  - 1 x daily - 7 x weekly - 1-2 sets - 10 reps - 3-5 hold - Hooklying Clamshell with Resistance  - 2 x daily - 7 x weekly - 1-2 sets - 10 reps - slow hold - Standing Tandem Balance with Counter Support  - 1 x daily - 7 x weekly - 1 sets - 3 reps - 10-15 hold - Standing Hip Abduction with Counter Support  - 1 x daily - 7 x weekly - 2 sets - 10 reps - Seated Hamstring Stretch  - 1 x daily - 7 x weekly - 1 sets - 5 reps - 30 hold - Supine Piriformis Stretch  - 1 x daily - 7 x weekly - 1 sets - 3 reps - 30 hold - Seated Figure 4 Piriformis Stretch  - 1 x daily - 7 x weekly - 1 sets - 3 reps - 30 hold   ASSESSMENT:   CLINICAL IMPRESSION:  Patient continues to need minimal cues with gait for sequencing and trunk lean.  Left Trendelenburg presents and difficult to correct with cueing .  Patient tolerated the session well not limited by pain despite subjective report of high pain levels. Worked mostly in standing for strength and postural education.    OBJECTIVE IMPAIRMENTS: Abnormal gait, decreased activity tolerance, decreased balance, decreased coordination, decreased knowledge of use of DME, decreased mobility, difficulty walking, decreased ROM, decreased strength, decreased safety awareness, increased fascial restrictions, improper body mechanics, postural dysfunction, and pain.    ACTIVITY LIMITATIONS: carrying, lifting, bending, standing, squatting, sleeping, stairs, transfers, bed mobility, and locomotion level   PARTICIPATION LIMITATIONS: meal prep, cleaning, laundry, shopping, community activity, and church   PERSONAL FACTORS: Age and 1-2 comorbidities: osteoporosis, precious back surgeries  are also affecting patient's functional outcome.    REHAB POTENTIAL: Good   CLINICAL DECISION MAKING: Evolving/moderate complexity   EVALUATION COMPLEXITY: Moderate     GOALS:     LONG TERM GOALS: Target  date: 07/22/2022     Pt will be I with HEP Baseline: given basic on eval  Goal status: ongoing  2.  Pt will complete balance testing and goal set Baseline: TUG, 22 sec, Berg 42/56 - GOAL is 48/56  Goal status: NEW GOAL    3.  Pt will be able to safely Sit to stand without compensations in 15 sec or less  Baseline: Increased postural sway from elevated mat table.  Needs close supervision due to momentum when rising.  Goal status: ongoing    4.  Pt will be able to ambulate with cane without cues and mod I , 300 feet while carrying handbag  Baseline: mod cues , min A given on Re-assessment (6/24) Goal status:ongoing    5. Pt will not be limited by pain in her back for home tasks  Baseline:  Goal status:ongoing      PLAN:   PT FREQUENCY: 1-2x/week   PT DURATION: 6 weeks   PLANNED INTERVENTIONS: Therapeutic exercises, Therapeutic activity, Neuromuscular re-education, Balance training, Gait training, Patient/Family education, Self Care, Stair training, DME instructions, Cryotherapy, Moist heat, Manual therapy, and Re-evaluation.   PLAN FOR NEXT SESSION: balance work, obstacles , HEP, more hip stab and stretches/ manual to reduce pain.    Karie Mainland, PT 08/05/22 12:05 PM Phone: [phone] Fax: [phone]

## 2022-08-09 NOTE — Therapy (Unsigned)
OUTPATIENT PHYSICAL THERAPY TREATMENT NOTE   Patient Name: Danielle Hernandez MRN: [id number] DOB:03-07-1935, 87 y.o., female Today's Date: 08/10/2022  PCP: Dr. Lupita Raider   REFERRING PROVIDER: Tressie Stalker, MD  END OF SESSION:   PT End of Session - 08/10/22 1107     Visit Number 11    Number of Visits 24    Date for PT Re-Evaluation 08/16/22    Authorization Type [employer] Medicare    Progress Note Due on Visit 10    PT Start Time 1103    PT Stop Time 1145    PT Time Calculation (min) 42 min    Activity Tolerance Patient tolerated treatment well    Behavior During Therapy WFL for tasks assessed/performed               Past Medical History:  Diagnosis Date   Arthritis    Headache(784.0)    HTN (hypertension)    Osteoporosis    Raynaud disease    Past Surgical History:  Procedure Laterality Date   ABDOMINAL HYSTERECTOMY     BACK SURGERY     EYE SURGERY     Bilateral cataracts   LUMBAR LAMINECTOMY/DECOMPRESSION MICRODISCECTOMY Bilateral 11/02/2021   Procedure: T12-L1, L1-L2 LAM,FORAM;  Surgeon: Tressie Stalker, MD;  Location: [facility] OR;  Service: Neurosurgery;  Laterality: Bilateral;  3C   TONSILLECTOMY     under chin cyst removed     WRIST GANGLION EXCISION     right hand   Patient Active Problem List   Diagnosis Date Noted   Myelopathy concurrent with and due to spinal stenosis of thoracic region (HCC) 11/02/2021   CDNH (chondrodermatitis nodularis helicis), right 02/21/2018   Impacted cerumen of left ear 03/04/2016   Fecal incontinence 04/26/2012   Lumbar stenosis 02/08/2011    REFERRING DIAG: spinal stenosis s/p L1-L2 laminotomy /foraminotomy   THERAPY DIAG:  Difficulty in walking, not elsewhere classified  Other low back pain  Muscle weakness (generalized)  Abnormality of gait due to impairment of balance  Chronic left-sided low back pain with left-sided sciatica  Pain in left hip  Abnormal posture  Rationale for Evaluation and Treatment  Rehabilitation  PERTINENT HISTORY:  Osteoporosis  Arthritis Raynaud HTN 11/02/21: T12-L1, L1-L2 Laminectomy, decompression, microdisc.  L shoulder fx and L wrist fx s/p falls  2013: 3 level PLIF   PRECAUTIONS: Fall  SUBJECTIVE:                                                                                                                                                                                      SUBJECTIVE STATEMENT: My back hurts 5/10. I see Dr. Lovell Sheehan today.  My  L hip only hurts when I try to make the bed, sweep the floor. If forgot my cane.     PAIN:  Are you having pain? Yes: NPRS scale: 5/10 back.  L hamstring/hip 6/10.  Pain location: back of  L leg mostly  Pain description: stiff, achy, weak Aggravating factors: getting out of bed  Relieving factors: sitting to do her housework    OBJECTIVE: (objective measures completed at initial evaluation unless otherwise dated)   DIAGNOSTIC FINDINGS:  None recent    PATIENT SURVEYS:  FOTO 54% 07/05/22   FOTO     07/19/22  54%    SCREENING FOR RED FLAGS: Bowel or bladder incontinence: Yes: premorbid Spinal tumors: No Cauda equina syndrome: No Compression fracture: No Abdominal aneurysm: No   COGNITION: Overall cognitive status: Within functional limits for tasks assessed                          SENSATION: WFL   MUSCLE LENGTH: Hamstrings: tight Thomas test: NT    POSTURE: rounded shoulders, forward head, and left pelvic obliquity High L hip , leans Rt    PALPATION: TTP Lt. lumbar    LUMBAR ROM:    AROM eval  Flexion deferred  Extension    Right lateral flexion    Left lateral flexion    Right rotation 25%  Left rotation 25%   (Blank rows = not tested)   LOWER EXTREMITY ROM:      Passive  Right eval Left  eval  Hip flexion WNL WNL   Hip extension      Hip abduction      Hip adduction      Hip internal rotation 15-20 deg 25 deg   Hip external rotation 25 deg  30 deg   Knee flexion WNL  WNL   Knee extension      Ankle dorsiflexion stiff stiff  Ankle plantarflexion      Ankle inversion      Ankle eversion       (Blank rows = not tested)   LOWER EXTREMITY MMT:     MMT Right eval Left eval 07/12/22 right 07/12/22 left  Hip flexion 4 4+    Hip extension        Hip abduction     3 3-  Hip adduction        Hip internal rotation        Hip external rotation        Knee flexion 4 4    Knee extension 4 4    Ankle dorsiflexion 4+ 3+    Ankle plantarflexion        Ankle inversion        Ankle eversion         (Blank rows = not tested)   LUMBAR SPECIAL TESTS:  NT    FUNCTIONAL TESTS:  5 times sit to stand: 10.3 30 seconds chair stand test 9 reps, max cues to avoid using back of legs on the mat  07/05/22: TUG 22.19 sec with SPC 07/19/22: Berg 42/56   GAIT: Distance walked: 150 Assistive device utilized: Single point cane and without Level of assistance: Min A pt needs close supervision with cane, min A without  Comments: foot catches on the floor, min A to recover balance   TODAY'S TREATMENT:        [facility] Adult PT Treatment:  DATE: 08/10/22 Therapeutic Exercise: Nustep L4 UE and LE for 6 min  Lower trunk rotation x 10 Knee to chest with hip circles  Therapeutic Activity: Mimic sweeping for body mechanics and tips , using long dowel and towel underneath  Airex balance activities with head turns, nods Tandem stance trials  Narrow stance static Eyes open, closed   [facility] Adult PT Treatment:                                                DATE: 08/05/22 Therapeutic Exercise: Supine piriformis (knee to opposite shoulder)  Hamstring dynamic  Bridge x 15  SLR x 10 , cues  Standing hip abduction  Hip extension  Shoulder flexion with cane leaning on wall for extension  Sidestepping toes for ward Tandem walking  High march  Heel raises  Narrow stance alternating arms overhead  Tandem stance hold 30 sec , close  supervision    [facility] Adult PT Treatment:                                                DATE: 08/02/22 Therapeutic Exercise: Step ups forward and lateral , 4 inch x 20 each LE  Hip abduction x 15  Supine piriformis knee to chest and opposite shoulder Knee extension AROM Figure 4 knee press  Sit to stand with Pilates circle lift for increased trunk extension and coordination  Standing rotation x 10 each side with Pilates circle Therapeutic Activity: Gait 360 feet with cane , cues for sequencing and posture   [facility] Adult PT Treatment:                                                DATE: 07/26/22 Therapeutic Exercise: Standing there ex: heel raises x 15 Squat x 10 decr WB on Rt LE  Airex pad: static hold, eyes open, eyes closed High knee marching, tandem stance with min A needed, 1 UE  Hamstring stretching, 3 way with strap Green looped band around thighs x 15  Bridge with band  x 10  Piriformis knee crossing over to opp thigh  LTR knees crossed  Manual Therapy: Sidelying manual to L post hip            [facility] Adult PT Treatment:                                                DATE: 07/22/22 Therapeutic Exercise: Nustep L4 UE/LE x 5 minutes  Supine nerve flossing LLE assisted by PTA , 90/90 knee flex/ ext, knee ext with ankle pumps SKTC with opp knee ext LTR with overpressure from PTA Bridge 10 x 2  S/L clam to fatigue bilat  S/L hip abdct to fatigue bilat -assist with left  Neuro reeducation:  Tandem stance trials  Narrow stance with head turns AIREX narrow stance  Therapeutic Activity: Side stepping and retro stepping at counter     DATE: 06/10/22  PATIENT EDUCATION:  Education details: using cane when out of the house, safety with nighttime bathroom trips  Person educated: Patient Education method: Explanation, Demonstration, Verbal cues, and Handouts Education comprehension: verbalized understanding and needs further education   HOME EXERCISE PROGRAM: Access  Code: [email or web address] URL: [email or web address] Date: 06/10/2022 Prepared by: Karie Mainland   Access Code: [email or web address] URL: [email or web address] Date: 07/26/2022 Prepared by: Karie Mainland  Exercises - Supine Lower Trunk Rotation  - 1 x daily - 7 x weekly - 2 sets - 10 reps - 10 hold - Hooklying Single Knee to Chest Stretch  - 1 x daily - 7 x weekly - 1 sets - 10 reps - 10 hold - Sit to Stand with Counter Support  - 3 x daily - 7 x weekly - 2 sets - 10 reps - 5 hold - Supine Bridge  - 1 x daily - 7 x weekly - 1-2 sets - 10 reps - 3-5 hold - Hooklying Clamshell with Resistance  - 2 x daily - 7 x weekly - 1-2 sets - 10 reps - slow hold - Standing Tandem Balance with Counter Support  - 1 x daily - 7 x weekly - 1 sets - 3 reps - 10-15 hold - Standing Hip Abduction with Counter Support  - 1 x daily - 7 x weekly - 2 sets - 10 reps - Seated Hamstring Stretch  - 1 x daily - 7 x weekly - 1 sets - 5 reps - 30 hold - Supine Piriformis Stretch  - 1 x daily - 7 x weekly - 1 sets - 3 reps - 30 hold - Seated Figure 4 Piriformis Stretch  - 1 x daily - 7 x weekly - 1 sets - 3 reps - 30 hold   ASSESSMENT:   CLINICAL IMPRESSION:  Patient continue to have increased pain in lumbar with standing to about 7/10 today.  She continues to need cues for completion of balance exercise.  Worked on posture with ADLs including sweeping and cleaning. Pt subjectively reports feeling more steady wth walking in her home.  She forgot her cane today but understands she needs her cane in the community.   OBJECTIVE IMPAIRMENTS: Abnormal gait, decreased activity tolerance, decreased balance, decreased coordination, decreased knowledge of use of DME, decreased mobility, difficulty walking, decreased ROM, decreased strength, decreased safety awareness, increased fascial restrictions, improper body mechanics, postural dysfunction, and pain.    ACTIVITY LIMITATIONS: carrying, lifting, bending, standing,  squatting, sleeping, stairs, transfers, bed mobility, and locomotion level   PARTICIPATION LIMITATIONS: meal prep, cleaning, laundry, shopping, community activity, and church   PERSONAL FACTORS: Age and 1-2 comorbidities: osteoporosis, precious back surgeries  are also affecting patient's functional outcome.    REHAB POTENTIAL: Good   CLINICAL DECISION MAKING: Evolving/moderate complexity   EVALUATION COMPLEXITY: Moderate     GOALS:     LONG TERM GOALS: Target date: 07/22/2022     Pt will be I with HEP Baseline: given basic on eval  Goal status: ongoing  2.  Pt will complete balance testing and goal set Baseline: TUG, 22 sec, Berg 42/56 - GOAL is 48/56  Goal status: NEW GOAL    3.  Pt will be able to safely Sit to stand without compensations in 15 sec or less  Baseline: Increased postural sway from elevated mat table.  Needs close supervision due to momentum when rising.  Goal status: ongoing    4.  Pt will be able to ambulate with cane without cues and mod I ,  300 feet while carrying handbag  Baseline: mod cues , min A given on Re-assessment (6/24) Goal status:ongoing     5. Pt will not be limited by pain in her back for home tasks  Baseline:  Goal status:ongoing      PLAN:   PT FREQUENCY: 1-2x/week   PT DURATION: 6 weeks   PLANNED INTERVENTIONS: Therapeutic exercises, Therapeutic activity, Neuromuscular re-education, Balance training, Gait training, Patient/Family education, Self Care, Stair training, DME instructions, Cryotherapy, Moist heat, Manual therapy, and Re-evaluation.   PLAN FOR NEXT SESSION: balance work, obstacles , HEP, more hip stab and stretches/ manual to reduce pain.    Karie Mainland, PT 08/10/22 1:56 PM Phone: [phone] Fax: [phone]

## 2022-08-10 ENCOUNTER — Ambulatory Visit: Payer: Medicare Other | Admitting: Physical Therapy

## 2022-08-10 ENCOUNTER — Encounter: Payer: Self-pay | Admitting: Physical Therapy

## 2022-08-10 DIAGNOSIS — G8929 Other chronic pain: Secondary | ICD-10-CM

## 2022-08-10 DIAGNOSIS — M6281 Muscle weakness (generalized): Secondary | ICD-10-CM | POA: Diagnosis not present

## 2022-08-10 DIAGNOSIS — M5459 Other low back pain: Secondary | ICD-10-CM

## 2022-08-10 DIAGNOSIS — R262 Difficulty in walking, not elsewhere classified: Secondary | ICD-10-CM

## 2022-08-10 DIAGNOSIS — M25552 Pain in left hip: Secondary | ICD-10-CM | POA: Diagnosis not present

## 2022-08-10 DIAGNOSIS — R2689 Other abnormalities of gait and mobility: Secondary | ICD-10-CM

## 2022-08-10 DIAGNOSIS — R293 Abnormal posture: Secondary | ICD-10-CM

## 2022-08-10 DIAGNOSIS — M48062 Spinal stenosis, lumbar region with neurogenic claudication: Secondary | ICD-10-CM | POA: Diagnosis not present

## 2022-08-10 DIAGNOSIS — M5442 Lumbago with sciatica, left side: Secondary | ICD-10-CM | POA: Diagnosis not present

## 2022-08-11 DIAGNOSIS — H6122 Impacted cerumen, left ear: Secondary | ICD-10-CM | POA: Diagnosis not present

## 2022-08-11 DIAGNOSIS — H608X2 Other otitis externa, left ear: Secondary | ICD-10-CM | POA: Diagnosis not present

## 2022-08-13 ENCOUNTER — Ambulatory Visit: Payer: Medicare Other | Admitting: Physical Therapy

## 2022-08-16 ENCOUNTER — Ambulatory Visit: Payer: Medicare Other | Admitting: Physical Therapy

## 2022-08-16 ENCOUNTER — Encounter: Payer: Self-pay | Admitting: Physical Therapy

## 2022-08-16 DIAGNOSIS — M5442 Lumbago with sciatica, left side: Secondary | ICD-10-CM | POA: Diagnosis not present

## 2022-08-16 DIAGNOSIS — R2689 Other abnormalities of gait and mobility: Secondary | ICD-10-CM | POA: Diagnosis not present

## 2022-08-16 DIAGNOSIS — M5459 Other low back pain: Secondary | ICD-10-CM | POA: Diagnosis not present

## 2022-08-16 DIAGNOSIS — R262 Difficulty in walking, not elsewhere classified: Secondary | ICD-10-CM

## 2022-08-16 DIAGNOSIS — M25552 Pain in left hip: Secondary | ICD-10-CM | POA: Diagnosis not present

## 2022-08-16 DIAGNOSIS — R293 Abnormal posture: Secondary | ICD-10-CM | POA: Diagnosis not present

## 2022-08-16 DIAGNOSIS — G8929 Other chronic pain: Secondary | ICD-10-CM | POA: Diagnosis not present

## 2022-08-16 DIAGNOSIS — M6281 Muscle weakness (generalized): Secondary | ICD-10-CM | POA: Diagnosis not present

## 2022-08-16 NOTE — Therapy (Addendum)
OUTPATIENT PHYSICAL THERAPY TREATMENT NOTE RENEWAL   Patient Name: Danielle Hernandez MRN: [id number] DOB:12/08/1935, 87 y.o., female Today's Date: 08/16/2022  PCP: Dr. Lupita Raider   REFERRING PROVIDER: Tressie Stalker, MD  END OF SESSION:   PT End of Session - 08/16/22 1104     Visit Number 12    Number of Visits 24    Date for PT Re-Evaluation 09/27/22    Authorization Type [employer] Medicare    PT Start Time 1103    PT Stop Time 1145    PT Time Calculation (min) 42 min               Past Medical History:  Diagnosis Date   Arthritis    Headache(784.0)    HTN (hypertension)    Osteoporosis    Raynaud disease    Past Surgical History:  Procedure Laterality Date   ABDOMINAL HYSTERECTOMY     BACK SURGERY     EYE SURGERY     Bilateral cataracts   LUMBAR LAMINECTOMY/DECOMPRESSION MICRODISCECTOMY Bilateral 11/02/2021   Procedure: T12-L1, L1-L2 LAM,FORAM;  Surgeon: Tressie Stalker, MD;  Location: [facility] OR;  Service: Neurosurgery;  Laterality: Bilateral;  3C   TONSILLECTOMY     under chin cyst removed     WRIST GANGLION EXCISION     right hand   Patient Active Problem List   Diagnosis Date Noted   Myelopathy concurrent with and due to spinal stenosis of thoracic region (HCC) 11/02/2021   CDNH (chondrodermatitis nodularis helicis), right 02/21/2018   Impacted cerumen of left ear 03/04/2016   Fecal incontinence 04/26/2012   Lumbar stenosis 02/08/2011    REFERRING DIAG: spinal stenosis s/p L1-L2 laminotomy /foraminotomy   THERAPY DIAG:  Difficulty in walking, not elsewhere classified  Other low back pain  Rationale for Evaluation and Treatment Rehabilitation  PERTINENT HISTORY:  Osteoporosis  Arthritis Raynaud HTN 11/02/21: T12-L1, L1-L2 Laminectomy, decompression, microdisc.  L shoulder fx and L wrist fx s/p falls  2013: 3 level PLIF   PRECAUTIONS: Fall  SUBJECTIVE:                                                                                                                                                                                       SUBJECTIVE STATEMENT: Dr Lovell Sheehan did not order an MRI since I was not interested in having surgery. He did not mention PT. He was in a hurry. I had some some back pain with cleaning yesterday.     PAIN:  Are you having pain? Yes: NPRS scale: 5/10 back.  L hamstring/hip 8/10.  Pain location: back of  L leg mostly  Pain description: stiff, achy, weak  Aggravating factors: getting out of bed  Relieving factors: sitting to do her housework    OBJECTIVE: (objective measures completed at initial evaluation unless otherwise dated)   DIAGNOSTIC FINDINGS:  None recent    PATIENT SURVEYS:  FOTO 54% 07/05/22   FOTO     07/19/22  54% FOTO 52% 08/16/22    SCREENING FOR RED FLAGS: Bowel or bladder incontinence: Yes: premorbid Spinal tumors: No Cauda equina syndrome: No Compression fracture: No Abdominal aneurysm: No   COGNITION: Overall cognitive status: Within functional limits for tasks assessed                          SENSATION: WFL   MUSCLE LENGTH: Hamstrings: tight Thomas test: NT    POSTURE: rounded shoulders, forward head, and left pelvic obliquity High L hip , leans Rt    PALPATION: TTP Lt. lumbar    LUMBAR ROM:    AROM eval  Flexion deferred  Extension    Right lateral flexion    Left lateral flexion    Right rotation 25%  Left rotation 25%   (Blank rows = not tested)   LOWER EXTREMITY ROM:      Passive  Right eval Left  eval  Hip flexion WNL WNL   Hip extension      Hip abduction      Hip adduction      Hip internal rotation 15-20 deg 25 deg   Hip external rotation 25 deg  30 deg   Knee flexion WNL WNL   Knee extension      Ankle dorsiflexion stiff stiff  Ankle plantarflexion      Ankle inversion      Ankle eversion       (Blank rows = not tested)   LOWER EXTREMITY MMT:     MMT Right eval Left eval 07/12/22 right 07/12/22 left  Hip flexion 4 4+    Hip  extension        Hip abduction     3 3-  Hip adduction        Hip internal rotation        Hip external rotation        Knee flexion 4 4    Knee extension 4 4    Ankle dorsiflexion 4+ 3+    Ankle plantarflexion        Ankle inversion        Ankle eversion         (Blank rows = not tested)   LUMBAR SPECIAL TESTS:  NT    FUNCTIONAL TESTS:  5 times sit to stand: 10.3 30 seconds chair stand test 9 reps, max cues to avoid using back of legs on the mat  07/05/22: TUG 22.19 sec with SPC 07/19/22: Berg 42/56   GAIT: Distance walked: 150 Assistive device utilized: Single point cane and without Level of assistance: Min A pt needs close supervision with cane, min A without  Comments: foot catches on the floor, min A to recover balance   TODAY'S TREATMENT:       [facility] Adult PT Treatment:                                                DATE: 08/16/22 Therapeutic Exercise: Standing hip abdct yellow band at knees 10 x 2  Standing  hip ext yellow band at knees 10 x 2  Bilat heel raises  Tandem trial 5-10 sec best Nustep L4 UE /LE x 5 minutes  Therapeutic Activity: Gait with head turns Gait with nods Gait with eyes closed  Stepping over hurdles step- to and alternating - needs UE to alternate     [facility] Adult PT Treatment:                                                DATE: 08/10/22 Therapeutic Exercise: Nustep L4 UE and LE for 6 min  Lower trunk rotation x 10 Knee to chest with hip circles  Therapeutic Activity: Mimic sweeping for body mechanics and tips , using long dowel and towel underneath  Airex balance activities with head turns, nods Tandem stance trials  Narrow stance static Eyes open, closed   [facility] Adult PT Treatment:                                                DATE: 08/05/22 Therapeutic Exercise: Supine piriformis (knee to opposite shoulder)  Hamstring dynamic  Bridge x 15  SLR x 10 , cues  Standing hip abduction  Hip extension  Shoulder flexion with cane leaning  on wall for extension  Sidestepping toes for ward Tandem walking  High march  Heel raises  Narrow stance alternating arms overhead  Tandem stance hold 30 sec , close supervision         DATE: 06/10/22      PATIENT EDUCATION:  Education details: using cane when out of the house, safety with nighttime bathroom trips  Person educated: Patient Education method: Programmer, multimedia, Facilities manager, Verbal cues, and Handouts Education comprehension: verbalized understanding and needs further education   HOME EXERCISE PROGRAM: Access Code: [email or web address] URL: [email or web address] Date: 06/10/2022 Prepared by: Karie Mainland   Access Code: [email or web address] URL: [email or web address] Date: 07/26/2022 Prepared by: Karie Mainland  Exercises - Supine Lower Trunk Rotation  - 1 x daily - 7 x weekly - 2 sets - 10 reps - 10 hold - Hooklying Single Knee to Chest Stretch  - 1 x daily - 7 x weekly - 1 sets - 10 reps - 10 hold - Sit to Stand with Counter Support  - 3 x daily - 7 x weekly - 2 sets - 10 reps - 5 hold - Supine Bridge  - 1 x daily - 7 x weekly - 1-2 sets - 10 reps - 3-5 hold - Hooklying Clamshell with Resistance  - 2 x daily - 7 x weekly - 1-2 sets - 10 reps - slow hold - Standing Tandem Balance with Counter Support  - 1 x daily - 7 x weekly - 1 sets - 3 reps - 10-15 hold - Standing Hip Abduction with Counter Support  - 1 x daily - 7 x weekly - 2 sets - 10 reps - Seated Hamstring Stretch  - 1 x daily - 7 x weekly - 1 sets - 5 reps - 30 hold - Supine Piriformis Stretch  - 1 x daily - 7 x weekly - 1 sets - 3 reps - 30 hold - Seated Figure 4 Piriformis Stretch  - 1 x daily - 7 x weekly - 1 sets - 3  reps - 30 hold   ASSESSMENT:   CLINICAL IMPRESSION: Progressed standing hip strength with added resistance. Worked on dynamic gait and balance.  She had mild gait deviations with head turns and nods, no LOB. She was able to maintain a straight path with eyes closed. Pt FOTO score  remains about the same as start of care however she reports feeling stronger. She also demonstrates improved gait pattern with less episodes of LOB in clinic. Worked on dynamic gait without AD. She had mild gait deviations with head turns and nods, no LOB. She was able to maintain a straight path with eyes closed.    OBJECTIVE IMPAIRMENTS: Abnormal gait, decreased activity tolerance, decreased balance, decreased coordination, decreased knowledge of use of DME, decreased mobility, difficulty walking, decreased ROM, decreased strength, decreased safety awareness, increased fascial restrictions, improper body mechanics, postural dysfunction, and pain.    ACTIVITY LIMITATIONS: carrying, lifting, bending, standing, squatting, sleeping, stairs, transfers, bed mobility, and locomotion level   PARTICIPATION LIMITATIONS: meal prep, cleaning, laundry, shopping, community activity, and church   PERSONAL FACTORS: Age and 1-2 comorbidities: osteoporosis, precious back surgeries  are also affecting patient's functional outcome.    REHAB POTENTIAL: Good   CLINICAL DECISION MAKING: Evolving/moderate complexity   EVALUATION COMPLEXITY: Moderate     GOALS:     LONG TERM GOALS: Target date: 07/22/2022     Pt will be I with HEP Baseline: given basic on eval  Goal status: ongoing  2.  Pt will complete balance testing and goal set Baseline: TUG, 22 sec, Berg 42/56 - GOAL is 48/56  Goal status: NEW GOAL    3.  Pt will be able to safely Sit to stand without compensations in 15 sec or less  Baseline: Increased postural sway from elevated mat table.  Needs close supervision due to momentum when rising.  Goal status: ongoing    4.  Pt will be able to ambulate with cane without cues and mod I , 300 feet while carrying handbag  Baseline: mod cues , min A given on Re-assessment (6/24) Goal status:ongoing     5. Pt will not be limited by pain in her back for home tasks  Baseline:  Goal status:ongoing       PLAN:   PT FREQUENCY: 1-2x/week   PT DURATION: 6 weeks   PLANNED INTERVENTIONS: Therapeutic exercises, Therapeutic activity, Neuromuscular re-education, Balance training, Gait training, Patient/Family education, Self Care, Stair training, DME instructions, Cryotherapy, Moist heat, Manual therapy, and Re-evaluation.   PLAN FOR NEXT SESSION: balance work, obstacles , HEP, more hip stab and stretches/ manual to reduce pain.    Jannette Spanner, PTA 08/16/22 1:28 PM Phone: [phone] Fax: [phone]       Karie Mainland, PT 08/16/22 1:28 PM Phone: [phone] Fax: [phone]

## 2022-08-16 NOTE — Addendum Note (Signed)
Addended by: Karie Mainland L on: 08/16/2022 01:31 PM   Modules accepted: Orders

## 2022-08-30 ENCOUNTER — Encounter: Payer: Self-pay | Admitting: Physical Therapy

## 2022-08-30 ENCOUNTER — Ambulatory Visit: Payer: Medicare Other | Attending: Neurosurgery | Admitting: Physical Therapy

## 2022-08-30 DIAGNOSIS — R293 Abnormal posture: Secondary | ICD-10-CM | POA: Diagnosis not present

## 2022-08-30 DIAGNOSIS — M25552 Pain in left hip: Secondary | ICD-10-CM | POA: Insufficient documentation

## 2022-08-30 DIAGNOSIS — R2689 Other abnormalities of gait and mobility: Secondary | ICD-10-CM | POA: Diagnosis not present

## 2022-08-30 DIAGNOSIS — R262 Difficulty in walking, not elsewhere classified: Secondary | ICD-10-CM | POA: Diagnosis not present

## 2022-08-30 DIAGNOSIS — M6281 Muscle weakness (generalized): Secondary | ICD-10-CM | POA: Diagnosis not present

## 2022-08-30 DIAGNOSIS — M5459 Other low back pain: Secondary | ICD-10-CM | POA: Diagnosis not present

## 2022-08-30 DIAGNOSIS — G8929 Other chronic pain: Secondary | ICD-10-CM | POA: Diagnosis not present

## 2022-08-30 DIAGNOSIS — M5442 Lumbago with sciatica, left side: Secondary | ICD-10-CM | POA: Diagnosis not present

## 2022-08-30 NOTE — Therapy (Signed)
OUTPATIENT PHYSICAL THERAPY TREATMENT NOTE    Patient Name: Danielle Hernandez MRN: [id number] DOB:10/01/1935, 87 y.o., female Today's Date: 08/30/2022  PCP: Dr. Lupita Raider   REFERRING PROVIDER: Tressie Stalker, MD  END OF SESSION:   PT End of Session - 08/30/22 1111     Visit Number 13    Number of Visits 24    Date for PT Re-Evaluation 09/27/22    Authorization Type [employer] Medicare    PT Start Time 1103    PT Stop Time 1145    PT Time Calculation (min) 42 min               Past Medical History:  Diagnosis Date   Arthritis    Headache(784.0)    HTN (hypertension)    Osteoporosis    Raynaud disease    Past Surgical History:  Procedure Laterality Date   ABDOMINAL HYSTERECTOMY     BACK SURGERY     EYE SURGERY     Bilateral cataracts   LUMBAR LAMINECTOMY/DECOMPRESSION MICRODISCECTOMY Bilateral 11/02/2021   Procedure: T12-L1, L1-L2 LAM,FORAM;  Surgeon: Tressie Stalker, MD;  Location: [facility] OR;  Service: Neurosurgery;  Laterality: Bilateral;  3C   TONSILLECTOMY     under chin cyst removed     WRIST GANGLION EXCISION     right hand   Patient Active Problem List   Diagnosis Date Noted   Myelopathy concurrent with and due to spinal stenosis of thoracic region (HCC) 11/02/2021   CDNH (chondrodermatitis nodularis helicis), right 02/21/2018   Impacted cerumen of left ear 03/04/2016   Fecal incontinence 04/26/2012   Lumbar stenosis 02/08/2011    REFERRING DIAG: spinal stenosis s/p L1-L2 laminotomy /foraminotomy   THERAPY DIAG:  Difficulty in walking, not elsewhere classified  Other low back pain  Muscle weakness (generalized)  Rationale for Evaluation and Treatment Rehabilitation  PERTINENT HISTORY:  Osteoporosis  Arthritis Raynaud HTN 11/02/21: T12-L1, L1-L2 Laminectomy, decompression, microdisc.  L shoulder fx and L wrist fx s/p falls  2013: 3 level PLIF   PRECAUTIONS: Fall  SUBJECTIVE:                                                                                                                                                                                       SUBJECTIVE STATEMENT:Leg still hurts a lot at night. It is sometimes better during the day. I think I am walking better. I am not using the cane in the house as much.     PAIN:  Are you having pain? Yes: NPRS scale: 5/10 back.  L hamstring/hip 6/10.  Pain location: back of  L leg mostly  Pain description: stiff, achy, weak  Aggravating factors: getting out of bed  Relieving factors: sitting to do her housework    OBJECTIVE: (objective measures completed at initial evaluation unless otherwise dated)   DIAGNOSTIC FINDINGS:  None recent    PATIENT SURVEYS:  FOTO 54% 07/05/22   FOTO     07/19/22  54% FOTO 52% 08/16/22    SCREENING FOR RED FLAGS: Bowel or bladder incontinence: Yes: premorbid Spinal tumors: No Cauda equina syndrome: No Compression fracture: No Abdominal aneurysm: No   COGNITION: Overall cognitive status: Within functional limits for tasks assessed                          SENSATION: WFL   MUSCLE LENGTH: Hamstrings: tight Thomas test: NT    POSTURE: rounded shoulders, forward head, and left pelvic obliquity High L hip , leans Rt    PALPATION: TTP Lt. lumbar    LUMBAR ROM:    AROM eval  Flexion deferred  Extension    Right lateral flexion    Left lateral flexion    Right rotation 25%  Left rotation 25%   (Blank rows = not tested)   LOWER EXTREMITY ROM:      Passive  Right eval Left  eval  Hip flexion WNL WNL   Hip extension      Hip abduction      Hip adduction      Hip internal rotation 15-20 deg 25 deg   Hip external rotation 25 deg  30 deg   Knee flexion WNL WNL   Knee extension      Ankle dorsiflexion stiff stiff  Ankle plantarflexion      Ankle inversion      Ankle eversion       (Blank rows = not tested)   LOWER EXTREMITY MMT:     MMT Right eval Left eval 07/12/22 right 07/12/22 left  Hip flexion 4 4+    Hip  extension        Hip abduction     3 3-  Hip adduction        Hip internal rotation        Hip external rotation        Knee flexion 4 4    Knee extension 4 4    Ankle dorsiflexion 4+ 3+    Ankle plantarflexion        Ankle inversion        Ankle eversion         (Blank rows = not tested)   LUMBAR SPECIAL TESTS:  NT    FUNCTIONAL TESTS:  5 times sit to stand: 10.3 30 seconds chair stand test 9 reps, max cues to avoid using back of legs on the mat  07/05/22: TUG 22.19 sec with SPC 07/19/22: Berg 42/56   GAIT: Distance walked: 150 Assistive device utilized: Single point cane and without Level of assistance: Min A pt needs close supervision with cane, min A without  Comments: foot catches on the floor, min A to recover balance   TODAY'S TREATMENT:       [facility] Adult PT Treatment:                                                DATE: 08/30/22 Therapeutic Exercise: Nustep 5 minutes Standing hip abdct yellow  4 inch lateral step  up Heel raises x 20  Therapeutic Activity: Gait around obstacles- figure 8s Stepping over obstacles Tandem gait on tandem AIREX -1 Ue support at counter Tandem stance on tandem AIREX - needs light touch Gait with head turns   [facility] Adult PT Treatment:                                                DATE: 08/16/22 Therapeutic Exercise: Standing hip abdct yellow band at knees 10 x 2  Standing hip ext yellow band at knees 10 x 2  Bilat heel raises  Tandem trial 5-10 sec best Nustep L4 UE /LE x 5 minutes  Therapeutic Activity: Gait with head turns Gait with nods Gait with eyes closed  Stepping over hurdles step- to and alternating - needs UE to alternate     [facility] Adult PT Treatment:                                                DATE: 08/10/22 Therapeutic Exercise: Nustep L4 UE and LE for 6 min  Lower trunk rotation x 10 Knee to chest with hip circles  Therapeutic Activity: Mimic sweeping for body mechanics and tips , using long dowel and towel  underneath  Airex balance activities with head turns, nods Tandem stance trials  Narrow stance static Eyes open, closed   [facility] Adult PT Treatment:                                                DATE: 08/05/22 Therapeutic Exercise: Supine piriformis (knee to opposite shoulder)  Hamstring dynamic  Bridge x 15  SLR x 10 , cues  Standing hip abduction  Hip extension  Shoulder flexion with cane leaning on wall for extension  Sidestepping toes for ward Tandem walking  High march  Heel raises  Narrow stance alternating arms overhead  Tandem stance hold 30 sec , close supervision         DATE: 06/10/22      PATIENT EDUCATION:  Education details: using cane when out of the house, safety with nighttime bathroom trips  Person educated: Patient Education method: Programmer, multimedia, Facilities manager, Verbal cues, and Handouts Education comprehension: verbalized understanding and needs further education   HOME EXERCISE PROGRAM: Access Code: [email or web address] URL: [email or web address] Date: 06/10/2022 Prepared by: Karie Mainland   Access Code: [email or web address] URL: [email or web address] Date: 07/26/2022 Prepared by: Karie Mainland  Exercises - Supine Lower Trunk Rotation  - 1 x daily - 7 x weekly - 2 sets - 10 reps - 10 hold - Hooklying Single Knee to Chest Stretch  - 1 x daily - 7 x weekly - 1 sets - 10 reps - 10 hold - Sit to Stand with Counter Support  - 3 x daily - 7 x weekly - 2 sets - 10 reps - 5 hold - Supine Bridge  - 1 x daily - 7 x weekly - 1-2 sets - 10 reps - 3-5 hold - Hooklying Clamshell with Resistance  - 2 x daily - 7 x weekly - 1-2 sets - 10 reps - slow hold - Standing Tandem  Balance with Counter Support  - 1 x daily - 7 x weekly - 1 sets - 3 reps - 10-15 hold - Standing Hip Abduction with Counter Support  - 1 x daily - 7 x weekly - 2 sets - 10 reps - Seated Hamstring Stretch  - 1 x daily - 7 x weekly - 1 sets - 5 reps - 30 hold - Supine Piriformis Stretch  - 1  x daily - 7 x weekly - 1 sets - 3 reps - 30 hold - Seated Figure 4 Piriformis Stretch  - 1 x daily - 7 x weekly - 1 sets - 3 reps - 30 hold   ASSESSMENT:   CLINICAL IMPRESSION: Worked on dynamic gait and balance.  She did well ambulating around obstacles without AD and SBA. With stepping over obstacles she required CGA for alternating step pattern. Overall her gait on level surface is improved and she is forgetting her SPC due to feeling more balanced. Will recheck objective measures next visit.    OBJECTIVE IMPAIRMENTS: Abnormal gait, decreased activity tolerance, decreased balance, decreased coordination, decreased knowledge of use of DME, decreased mobility, difficulty walking, decreased ROM, decreased strength, decreased safety awareness, increased fascial restrictions, improper body mechanics, postural dysfunction, and pain.    ACTIVITY LIMITATIONS: carrying, lifting, bending, standing, squatting, sleeping, stairs, transfers, bed mobility, and locomotion level   PARTICIPATION LIMITATIONS: meal prep, cleaning, laundry, shopping, community activity, and church   PERSONAL FACTORS: Age and 1-2 comorbidities: osteoporosis, precious back surgeries  are also affecting patient's functional outcome.    REHAB POTENTIAL: Good   CLINICAL DECISION MAKING: Evolving/moderate complexity   EVALUATION COMPLEXITY: Moderate     GOALS:     LONG TERM GOALS: Target date: 07/22/2022     Pt will be I with HEP Baseline: given basic on eval  Goal status: ongoing  2.  Pt will complete balance testing and goal set Baseline: TUG, 22 sec, Berg 42/56 - GOAL is 48/56  Goal status: NEW GOAL    3.  Pt will be able to safely Sit to stand without compensations in 15 sec or less  Baseline: Increased postural sway from elevated mat table.  Needs close supervision due to momentum when rising.  Goal status: ongoing    4.  Pt will be able to ambulate with cane without cues and mod I , 300 feet while carrying  handbag  Baseline: mod cues , min A given on Re-assessment (6/24) Goal status:ongoing     5. Pt will not be limited by pain in her back for home tasks  Baseline:  Goal status:ongoing      PLAN:   PT FREQUENCY: 1-2x/week   PT DURATION: 6 weeks   PLANNED INTERVENTIONS: Therapeutic exercises, Therapeutic activity, Neuromuscular re-education, Balance training, Gait training, Patient/Family education, Self Care, Stair training, DME instructions, Cryotherapy, Moist heat, Manual therapy, and Re-evaluation.   PLAN FOR NEXT SESSION: balance work, obstacles , HEP, more hip stab and stretches/ manual to reduce pain. CHECK obj measures   Jannette Spanner, PTA 08/30/22 3:10 PM Phone: [phone] Fax: [phone]

## 2022-08-31 NOTE — Therapy (Unsigned)
OUTPATIENT PHYSICAL THERAPY TREATMENT NOTE    Patient Name: Danielle Hernandez MRN: [id number] DOB:[birth year], 87 y.o., female Today's Date: 09/01/2022  PCP: Dr. Lupita Raider   REFERRING PROVIDER: Tressie Stalker, MD  END OF SESSION:   PT End of Session - 09/01/22 1107     Visit Number 14    Number of Visits 24    Date for PT Re-Evaluation 09/27/22    Authorization Type [employer] Medicare    PT Start Time 1103    PT Stop Time 1145    PT Time Calculation (min) 42 min    Activity Tolerance Patient tolerated treatment well    Behavior During Therapy WFL for tasks assessed/performed                Past Medical History:  Diagnosis Date   Arthritis    Headache(784.0)    HTN (hypertension)    Osteoporosis    Raynaud disease    Past Surgical History:  Procedure Laterality Date   ABDOMINAL HYSTERECTOMY     BACK SURGERY     EYE SURGERY     Bilateral cataracts   LUMBAR LAMINECTOMY/DECOMPRESSION MICRODISCECTOMY Bilateral 11/02/2021   Procedure: T12-L1, L1-L2 LAM,FORAM;  Surgeon: Tressie Stalker, MD;  Location: [facility] OR;  Service: Neurosurgery;  Laterality: Bilateral;  3C   TONSILLECTOMY     under chin cyst removed     WRIST GANGLION EXCISION     right hand   Patient Active Problem List   Diagnosis Date Noted   Myelopathy concurrent with and due to spinal stenosis of thoracic region (HCC) 11/02/2021   CDNH (chondrodermatitis nodularis helicis), right 02/21/2018   Impacted cerumen of left ear 03/04/2016   Fecal incontinence 04/26/2012   Lumbar stenosis 02/08/2011    REFERRING DIAG: spinal stenosis s/p L1-L2 laminotomy /foraminotomy   THERAPY DIAG:  Difficulty in walking, not elsewhere classified  Other low back pain  Muscle weakness (generalized)  Abnormality of gait due to impairment of balance  Rationale for Evaluation and Treatment Rehabilitation  PERTINENT HISTORY:  Osteoporosis  Arthritis Raynaud HTN 11/02/21: T12-L1, L1-L2 Laminectomy, decompression,  microdisc.  L shoulder fx and L wrist fx s/p falls  2013: 3 level PLIF   PRECAUTIONS: Fall  SUBJECTIVE:                                                                                                                                                                                      SUBJECTIVE STATEMENT:  It think I'm walking a bit better. She really tries to have her cane with her when she is out.  She will even go back in the house to get it if she  forgets it.  She sees the Neurologist next week (bladder?).     PAIN:  Are you having pain? Yes: NPRS scale: 5/10 back.  L hamstring/hip 6/10.  Pain location: back of  L leg mostly  Pain description: stiff, achy, weak Aggravating factors: getting out of bed  Relieving factors: sitting to do her housework    OBJECTIVE: (objective measures completed at initial evaluation unless otherwise dated)   DIAGNOSTIC FINDINGS:  None recent    PATIENT SURVEYS:  FOTO 54% 07/05/22   FOTO     07/19/22  54% FOTO 52% 08/16/22    SCREENING FOR RED FLAGS: Bowel or bladder incontinence: Yes: premorbid Spinal tumors: No Cauda equina syndrome: No Compression fracture: No Abdominal aneurysm: No   COGNITION: Overall cognitive status: Within functional limits for tasks assessed                          SENSATION: WFL   MUSCLE LENGTH: Hamstrings: tight Thomas test: NT    POSTURE: rounded shoulders, forward head, and left pelvic obliquity High L hip , leans Rt    PALPATION: TTP Lt. lumbar    LUMBAR ROM:    AROM eval  Flexion deferred  Extension    Right lateral flexion    Left lateral flexion    Right rotation 25%  Left rotation 25%   (Blank rows = not tested)   LOWER EXTREMITY ROM:      Passive  Right eval Left  eval  Hip flexion WNL WNL   Hip extension      Hip abduction      Hip adduction      Hip internal rotation 15-20 deg 25 deg   Hip external rotation 25 deg  30 deg   Knee flexion WNL WNL   Knee extension       Ankle dorsiflexion stiff stiff  Ankle plantarflexion      Ankle inversion      Ankle eversion       (Blank rows = not tested)   LOWER EXTREMITY MMT:     MMT Right eval Left eval 07/12/22 right 07/12/22 left 09/01/22  Hip flexion 4 4+   4+, 4  Hip extension         Hip abduction     3 3- 3+, 3-  Hip adduction         Hip internal rotation         Hip external rotation         Knee flexion 4 4   5, 4+  Knee extension 4 4   5, 4  Ankle dorsiflexion 4+ 3+   5, 3+  Ankle plantarflexion         Ankle inversion         Ankle eversion          (Blank rows = not tested)   LUMBAR SPECIAL TESTS:  NT    FUNCTIONAL TESTS:  5 times sit to stand: 10.3 30 seconds chair stand test 9 reps, max cues to avoid using back of legs on the mat  07/05/22: TUG 22.19 sec with SPC 07/19/22: Berg 42/56 09/01/22: Sharlene Motts 47/56   GAIT: Distance walked: 150 Assistive device utilized: Single point cane and without Level of assistance: Min A pt needs close supervision with cane, min A without  Comments: foot catches on the floor, min A to recover balance   TODAY'S TREATMENT:        [facility]  Adult PT Treatment:                                                DATE: 09/01/22 Therapeutic Exercise: NuStep L 5 UE and LE for 6 min  MMT see above  Clam in sidelying x 15 mod cues  Therapeutic Activity: BERG BALANCE  Sitting to Standing: Numbers; 0-4: 4  4. Stands without using hands and stabilize independently  3. Stands independently using hands  2. Stands using hands after multiple trials  1. Min A to stand  0. Mod-Max A to stand Standing unsupported: Numbers; 0-4: 4  4. Stands safely for 2 minutes  3. Stands 2 minutes with supervision  2. Stands 30 seconds unsupported  1. Needs several tries to stand unsupported for 30 seconds  0. Unable to stand unsupported for 30 seconds Sitting unsupported: Numbers; 0-4: 4  4. Sits for 2 minutes independently  3. Sits for 2 minutes with supervision  2. Able to sit 30  seconds  1. Able to sit 10 seconds  0. Unable to sit for 10 seconds Standing to Sitting: Numbers; 0-4: 4 4. Sits safely with minimal use of hands 3. Controls descent with hands 2. Uses back of legs against chair to control descent 1. Sits independently, but uncontrolled descent 0. Needs assistance Transfers: Numbers; 0-4: 4  4. Transfers safely with minor use of hands  3. Transfers safely definite use of hands  2. Transfers with verbal cueing/supervision  1. Needs 1 person assist  0. Needs 2 person assist  Standing with eyes closed: Numbers; 0-4: 4  4. Stands safely for 10 seconds  3. Stands 10 seconds with supervision   2. Able to stand for 3 seconds  1. Unable to keep eyes closed for 3 seconds, but is safe  0. Needs assist to keep from falling Standing with feet together: Numbers; 0-4: 4 4. Stands for 1 minute safely 3. Stands for 1 minute with supervision 2. Unable to hold for 30 seconds  1. Needs help to attain position but can hold for 15 seconds  0. Needs help to attain position and unable to hold for 15 seconds Reaching forward with outstretched arm: Numbers; 0-4: 3  4. Reaches forward 10 inches  3. Reaches forward 5 inches  2. Reaches forward 2 inches  1. Reaches forward with supervision  0. Loses balance/requires assistace Retrieving object from the floor: Numbers; 0-4: 4 4. Able to pick up easily and safely 3. Able to pick up with supervision 2. Unable to pick up, but reaches within 1-2 inches independently 1. Unable to pick up and needs supervision 0. Unable/needs assistance to keep from falling  Turning to look behind: Numbers; 0-4: 3  4. Looks behind from both sides and weight shifts well  3. Looks behind one side only, other side less weight shift  2. Turns sideways only, maintains balance  1. Needs supervision when turning  0. Needs assistance  Turning 360 degrees: Numbers; 0-4: 2  4. Able to turn in </=4 seconds  3. Able to turn on one side in </= 4  seconds   2. Able to turn slowly, but safely  1. Needs supervision or verbal cueing  0. Needs assistance Place alternate foot on stool: Numbers; 0-4: 3 4. Completes 8 steps in 20 seconds 3. Completes 8 steps in >20 seconds 2. 4 steps  without assistance/supervision 1. Completes >2 steps with minimal assist 0. Unable, needs assist to keep from falling Standing with one foot in front: Numbers; 0-4: 2  4. Independent tandem for 30 seconds  3. Independent foot ahead for 30 seconds  2. Independent small step for 30 seconds  1. Needs help to step, but can hold for 15 seconds  0. Loses balance while standing/stepping Standing on one foot: Numbers; 0-4: 2 4. Holds >10 seconds 3. Holds 5-10 seconds 2. Holds >/=3 seconds  1. Holds <3 seconds 0. Unable   Total Score: 47/56    [facility] Adult PT Treatment:                                                DATE: 08/30/22 Therapeutic Exercise: Nustep 5 minutes Standing hip abdct yellow  4 inch lateral step up Heel raises x 20  Therapeutic Activity: Gait around obstacles- figure 8s Stepping over obstacles Tandem gait on tandem AIREX -1 Ue support at counter Tandem stance on tandem AIREX - needs light touch Gait with head turns   [facility] Adult PT Treatment:                                                DATE: 08/16/22 Therapeutic Exercise: Standing hip abdct yellow band at knees 10 x 2  Standing hip ext yellow band at knees 10 x 2  Bilat heel raises  Tandem trial 5-10 sec best Nustep L4 UE /LE x 5 minutes  Therapeutic Activity: Gait with head turns Gait with nods Gait with eyes closed  Stepping over hurdles step- to and alternating - needs UE to alternate     PATIENT EDUCATION:  Education details: balance testing, progress Person educated: Patient Education method: Explanation, Demonstration, Verbal cues, and Handouts Education comprehension: verbalized understanding and needs further education   HOME EXERCISE PROGRAM: Access Code:  [email or web address] URL: [email or web address] Date: 06/10/2022 Prepared by: Karie Mainland   Access Code: [email or web address] URL: [email or web address] Date: 07/26/2022 Prepared by: Karie Mainland  Exercises - Supine Lower Trunk Rotation  - 1 x daily - 7 x weekly - 2 sets - 10 reps - 10 hold - Hooklying Single Knee to Chest Stretch  - 1 x daily - 7 x weekly - 1 sets - 10 reps - 10 hold - Sit to Stand with Counter Support  - 3 x daily - 7 x weekly - 2 sets - 10 reps - 5 hold - Supine Bridge  - 1 x daily - 7 x weekly - 1-2 sets - 10 reps - 3-5 hold - Hooklying Clamshell with Resistance  - 2 x daily - 7 x weekly - 1-2 sets - 10 reps - slow hold - Standing Tandem Balance with Counter Support  - 1 x daily - 7 x weekly - 1 sets - 3 reps - 10-15 hold - Standing Hip Abduction with Counter Support  - 1 x daily - 7 x weekly - 2 sets - 10 reps - Seated Hamstring Stretch  - 1 x daily - 7 x weekly - 1 sets - 5 reps - 30 hold - Supine Piriformis Stretch  - 1 x daily - 7 x weekly - 1 sets - 3 reps - 30 hold -  Seated Figure 4 Piriformis Stretch  - 1 x daily - 7 x weekly - 1 sets - 3 reps - 30 hold   ASSESSMENT:   CLINICAL IMPRESSION:  Patient has increased her Berg Balance score by 5 points (42/56 to 47/56) and shows increased knee strength. She continues to have residual L hip and ankle DF weakness due to chronic lumbar pain. She is unable to resist manual pressure in hip abduction and suspect extension as well.   Sharlene Motts revealed difficulty with turns and narrowed stance, SLS.  She will cont to benefit from continued gait and balance work as well as posterior hip strength to optimize outcomes.    OBJECTIVE IMPAIRMENTS: Abnormal gait, decreased activity tolerance, decreased balance, decreased coordination, decreased knowledge of use of DME, decreased mobility, difficulty walking, decreased ROM, decreased strength, decreased safety awareness, increased fascial restrictions, improper body mechanics,  postural dysfunction, and pain.    ACTIVITY LIMITATIONS: carrying, lifting, bending, standing, squatting, sleeping, stairs, transfers, bed mobility, and locomotion level   PARTICIPATION LIMITATIONS: meal prep, cleaning, laundry, shopping, community activity, and church   PERSONAL FACTORS: Age and 1-2 comorbidities: osteoporosis, precious back surgeries  are also affecting patient's functional outcome.    REHAB POTENTIAL: Good   CLINICAL DECISION MAKING: Evolving/moderate complexity   EVALUATION COMPLEXITY: Moderate     GOALS:     LONG TERM GOALS: Target date:09/27/22     Pt will be I with HEP Baseline: given basic on eval  Goal status: ongoing  2.  Pt will complete balance testing and goal set Baseline: TUG, 22 sec, Berg 42/56 - GOAL is 48/56  Goal status: NEW GOAL    3.  Pt will be able to safely Sit to stand without compensations in 15 sec or less  Baseline: Increased postural sway from elevated mat table.  Needs close supervision due to momentum when rising.  Goal status: ongoing    4.  Pt will be able to ambulate with cane without cues and mod I , 300 feet while carrying handbag  Baseline: mod cues , min A given on Re-assessment (6/24) Goal status:ongoing     5. Pt will not be limited by pain in her back for home tasks  Baseline:  Goal status:ongoing      PLAN:   PT FREQUENCY: 1-2x/week   PT DURATION: 6 weeks   PLANNED INTERVENTIONS: Therapeutic exercises, Therapeutic activity, Neuromuscular re-education, Balance training, Gait training, Patient/Family education, Self Care, Stair training, DME instructions, Cryotherapy, Moist heat, Manual therapy, and Re-evaluation.    PLAN FOR NEXT SESSION: balance work, obstacles , HEP, more hip stab and stretches/ manual to reduce pain.    Karie Mainland, PT 09/01/22 11:59 AM Phone: [phone] Fax: [phone]

## 2022-09-01 ENCOUNTER — Encounter: Payer: Self-pay | Admitting: Physical Therapy

## 2022-09-01 ENCOUNTER — Ambulatory Visit: Payer: Medicare Other | Admitting: Physical Therapy

## 2022-09-01 DIAGNOSIS — M5442 Lumbago with sciatica, left side: Secondary | ICD-10-CM | POA: Diagnosis not present

## 2022-09-01 DIAGNOSIS — R262 Difficulty in walking, not elsewhere classified: Secondary | ICD-10-CM | POA: Diagnosis not present

## 2022-09-01 DIAGNOSIS — M5459 Other low back pain: Secondary | ICD-10-CM | POA: Diagnosis not present

## 2022-09-01 DIAGNOSIS — M6281 Muscle weakness (generalized): Secondary | ICD-10-CM | POA: Diagnosis not present

## 2022-09-01 DIAGNOSIS — R2689 Other abnormalities of gait and mobility: Secondary | ICD-10-CM | POA: Diagnosis not present

## 2022-09-01 DIAGNOSIS — G8929 Other chronic pain: Secondary | ICD-10-CM | POA: Diagnosis not present

## 2022-09-01 DIAGNOSIS — M25552 Pain in left hip: Secondary | ICD-10-CM | POA: Diagnosis not present

## 2022-09-01 DIAGNOSIS — R293 Abnormal posture: Secondary | ICD-10-CM | POA: Diagnosis not present

## 2022-09-06 ENCOUNTER — Encounter: Payer: Self-pay | Admitting: Physical Therapy

## 2022-09-06 ENCOUNTER — Ambulatory Visit: Payer: Medicare Other | Admitting: Physical Therapy

## 2022-09-06 DIAGNOSIS — R293 Abnormal posture: Secondary | ICD-10-CM | POA: Diagnosis not present

## 2022-09-06 DIAGNOSIS — M5442 Lumbago with sciatica, left side: Secondary | ICD-10-CM | POA: Diagnosis not present

## 2022-09-06 DIAGNOSIS — M5459 Other low back pain: Secondary | ICD-10-CM

## 2022-09-06 DIAGNOSIS — M25552 Pain in left hip: Secondary | ICD-10-CM | POA: Diagnosis not present

## 2022-09-06 DIAGNOSIS — G8929 Other chronic pain: Secondary | ICD-10-CM | POA: Diagnosis not present

## 2022-09-06 DIAGNOSIS — R262 Difficulty in walking, not elsewhere classified: Secondary | ICD-10-CM

## 2022-09-06 DIAGNOSIS — M6281 Muscle weakness (generalized): Secondary | ICD-10-CM | POA: Diagnosis not present

## 2022-09-06 DIAGNOSIS — R2689 Other abnormalities of gait and mobility: Secondary | ICD-10-CM | POA: Diagnosis not present

## 2022-09-06 NOTE — Therapy (Signed)
OUTPATIENT PHYSICAL THERAPY TREATMENT NOTE    Patient Name: Danielle Hernandez MRN: [id number] DOB:1935-05-14, 87 y.o., female Today's Date: 09/06/2022  PCP: Dr. Lupita Raider   REFERRING PROVIDER: Tressie Stalker, MD  END OF SESSION:   PT End of Session - 09/06/22 1103     Visit Number 15    Number of Visits 24    Date for PT Re-Evaluation 09/27/22    Authorization Type [employer]    Progress Note Due on Visit 10    PT Start Time 1102    PT Stop Time 1145    PT Time Calculation (min) 43 min                Past Medical History:  Diagnosis Date   Arthritis    Headache(784.0)    HTN (hypertension)    Osteoporosis    Raynaud disease    Past Surgical History:  Procedure Laterality Date   ABDOMINAL HYSTERECTOMY     BACK SURGERY     EYE SURGERY     Bilateral cataracts   LUMBAR LAMINECTOMY/DECOMPRESSION MICRODISCECTOMY Bilateral 11/02/2021   Procedure: T12-L1, L1-L2 LAM,FORAM;  Surgeon: Tressie Stalker, MD;  Location: [facility] OR;  Service: Neurosurgery;  Laterality: Bilateral;  3C   TONSILLECTOMY     under chin cyst removed     WRIST GANGLION EXCISION     right hand   Patient Active Problem List   Diagnosis Date Noted   Myelopathy concurrent with and due to spinal stenosis of thoracic region (HCC) 11/02/2021   CDNH (chondrodermatitis nodularis helicis), right 02/21/2018   Impacted cerumen of left ear 03/04/2016   Fecal incontinence 04/26/2012   Lumbar stenosis 02/08/2011    REFERRING DIAG: spinal stenosis s/p L1-L2 laminotomy /foraminotomy   THERAPY DIAG:  Difficulty in walking, not elsewhere classified  Other low back pain  Muscle weakness (generalized)  Rationale for Evaluation and Treatment Rehabilitation  PERTINENT HISTORY:  Osteoporosis  Arthritis Raynaud HTN 11/02/21: T12-L1, L1-L2 Laminectomy, decompression, microdisc.  L shoulder fx and L wrist fx s/p falls  2013: 3 level PLIF   PRECAUTIONS: Fall  SUBJECTIVE:                                                                                                                                                                                       SUBJECTIVE STATEMENT: I am doing okay. Now I have pain in the back of both legs. I am going to see Dr Clelia Croft (primary) on Friday.      PAIN:  Are you having pain? Yes: NPRS scale: 0/10 back at rest .  L/R hamstring/hip 6/10.  Pain location: back of  L  leg mostly , now on right too Pain description: stiff, achy, weak Aggravating factors: getting out of bed  Relieving factors: sitting to do her housework    OBJECTIVE: (objective measures completed at initial evaluation unless otherwise dated)   DIAGNOSTIC FINDINGS:  None recent    PATIENT SURVEYS:  FOTO 54% 07/05/22   FOTO     07/19/22  54% FOTO 52% 08/16/22    SCREENING FOR RED FLAGS: Bowel or bladder incontinence: Yes: premorbid Spinal tumors: No Cauda equina syndrome: No Compression fracture: No Abdominal aneurysm: No   COGNITION: Overall cognitive status: Within functional limits for tasks assessed                          SENSATION: WFL   MUSCLE LENGTH: Hamstrings: tight Thomas test: NT    POSTURE: rounded shoulders, forward head, and left pelvic obliquity High L hip , leans Rt    PALPATION: TTP Lt. lumbar    LUMBAR ROM:    AROM eval  Flexion deferred  Extension    Right lateral flexion    Left lateral flexion    Right rotation 25%  Left rotation 25%   (Blank rows = not tested)   LOWER EXTREMITY ROM:      Passive  Right eval Left  eval  Hip flexion WNL WNL   Hip extension      Hip abduction      Hip adduction      Hip internal rotation 15-20 deg 25 deg   Hip external rotation 25 deg  30 deg   Knee flexion WNL WNL   Knee extension      Ankle dorsiflexion stiff stiff  Ankle plantarflexion      Ankle inversion      Ankle eversion       (Blank rows = not tested)   LOWER EXTREMITY MMT:     MMT Right eval Left eval 07/12/22 right 07/12/22 left  09/01/22  Hip flexion 4 4+   4+, 4  Hip extension         Hip abduction     3 3- 3+, 3-  Hip adduction         Hip internal rotation         Hip external rotation         Knee flexion 4 4   5, 4+  Knee extension 4 4   5, 4  Ankle dorsiflexion 4+ 3+   5, 3+  Ankle plantarflexion         Ankle inversion         Ankle eversion          (Blank rows = not tested)   LUMBAR SPECIAL TESTS:  NT    FUNCTIONAL TESTS:  5 times sit to stand: 10.3 30 seconds chair stand test 9 reps, max cues to avoid using back of legs on the mat  07/05/22: TUG 22.19 sec with SPC 07/19/22: Berg 42/56 09/01/22: Sharlene Motts 47/56   GAIT: Distance walked: 150 Assistive device utilized: Single point cane and without Level of assistance: Min A pt needs close supervision with cane, min A without  Comments: foot catches on the floor, min A to recover balance   TODAY'S TREATMENT:       [facility] Adult PT Treatment:  DATE: 09/06/22 Therapeutic Exercise: Nustep L5  STS x 15 Seated h/s stretch bilat x 2 each  Neuromuscular re-ed: Alternating step taps without UE Alternating step taps from AIREX with out UE- CGA L SLS from AIREX with hip abduction- bilat UE SLS trials 2-3 se best  SLS trials on AIREX 2 sec best  AIREX stepping on and off without UE AIREX narrow stance with therapist pertubations    [facility] Adult PT Treatment:                                                DATE: 09/01/22 Therapeutic Exercise: NuStep L 5 UE and LE for 6 min  MMT see above  Clam in sidelying x 15 mod cues  Therapeutic Activity: BERG BALANCE  Sitting to Standing: Numbers; 0-4: 4  4. Stands without using hands and stabilize independently  3. Stands independently using hands  2. Stands using hands after multiple trials  1. Min A to stand  0. Mod-Max A to stand Standing unsupported: Numbers; 0-4: 4  4. Stands safely for 2 minutes  3. Stands 2 minutes with supervision  2. Stands 30 seconds  unsupported  1. Needs several tries to stand unsupported for 30 seconds  0. Unable to stand unsupported for 30 seconds Sitting unsupported: Numbers; 0-4: 4  4. Sits for 2 minutes independently  3. Sits for 2 minutes with supervision  2. Able to sit 30 seconds  1. Able to sit 10 seconds  0. Unable to sit for 10 seconds Standing to Sitting: Numbers; 0-4: 4 4. Sits safely with minimal use of hands 3. Controls descent with hands 2. Uses back of legs against chair to control descent 1. Sits independently, but uncontrolled descent 0. Needs assistance Transfers: Numbers; 0-4: 4  4. Transfers safely with minor use of hands  3. Transfers safely definite use of hands  2. Transfers with verbal cueing/supervision  1. Needs 1 person assist  0. Needs 2 person assist  Standing with eyes closed: Numbers; 0-4: 4  4. Stands safely for 10 seconds  3. Stands 10 seconds with supervision   2. Able to stand for 3 seconds  1. Unable to keep eyes closed for 3 seconds, but is safe  0. Needs assist to keep from falling Standing with feet together: Numbers; 0-4: 4 4. Stands for 1 minute safely 3. Stands for 1 minute with supervision 2. Unable to hold for 30 seconds  1. Needs help to attain position but can hold for 15 seconds  0. Needs help to attain position and unable to hold for 15 seconds Reaching forward with outstretched arm: Numbers; 0-4: 3  4. Reaches forward 10 inches  3. Reaches forward 5 inches  2. Reaches forward 2 inches  1. Reaches forward with supervision  0. Loses balance/requires assistace Retrieving object from the floor: Numbers; 0-4: 4 4. Able to pick up easily and safely 3. Able to pick up with supervision 2. Unable to pick up, but reaches within 1-2 inches independently 1. Unable to pick up and needs supervision 0. Unable/needs assistance to keep from falling  Turning to look behind: Numbers; 0-4: 3  4. Looks behind from both sides and weight shifts well  3. Looks behind one  side only, other side less weight shift  2. Turns sideways only, maintains balance  1. Needs supervision when turning  0.  Needs assistance  Turning 360 degrees: Numbers; 0-4: 2  4. Able to turn in </=4 seconds  3. Able to turn on one side in </= 4 seconds   2. Able to turn slowly, but safely  1. Needs supervision or verbal cueing  0. Needs assistance Place alternate foot on stool: Numbers; 0-4: 3 4. Completes 8 steps in 20 seconds 3. Completes 8 steps in >20 seconds 2. 4 steps without assistance/supervision 1. Completes >2 steps with minimal assist 0. Unable, needs assist to keep from falling Standing with one foot in front: Numbers; 0-4: 2  4. Independent tandem for 30 seconds  3. Independent foot ahead for 30 seconds  2. Independent small step for 30 seconds  1. Needs help to step, but can hold for 15 seconds  0. Loses balance while standing/stepping Standing on one foot: Numbers; 0-4: 2 4. Holds >10 seconds 3. Holds 5-10 seconds 2. Holds >/=3 seconds  1. Holds <3 seconds 0. Unable   Total Score: 47/56    [facility] Adult PT Treatment:                                                DATE: 08/30/22 Therapeutic Exercise: Nustep 5 minutes Standing hip abdct yellow  4 inch lateral step up Heel raises x 20  Therapeutic Activity: Gait around obstacles- figure 8s Stepping over obstacles Tandem gait on tandem AIREX -1 Ue support at counter Tandem stance on tandem AIREX - needs light touch Gait with head turns   [facility] Adult PT Treatment:                                                DATE: 08/16/22 Therapeutic Exercise: Standing hip abdct yellow band at knees 10 x 2  Standing hip ext yellow band at knees 10 x 2  Bilat heel raises  Tandem trial 5-10 sec best Nustep L4 UE /LE x 5 minutes  Therapeutic Activity: Gait with head turns Gait with nods Gait with eyes closed  Stepping over hurdles step- to and alternating - needs UE to alternate     PATIENT EDUCATION:  Education  details: balance testing, progress Person educated: Patient Education method: Explanation, Demonstration, Verbal cues, and Handouts Education comprehension: verbalized understanding and needs further education   HOME EXERCISE PROGRAM: Access Code: [email or web address] URL: [email or web address] Date: 06/10/2022 Prepared by: Karie Mainland   Access Code: [email or web address] URL: [email or web address] Date: 07/26/2022 Prepared by: Karie Mainland  Exercises - Supine Lower Trunk Rotation  - 1 x daily - 7 x weekly - 2 sets - 10 reps - 10 hold - Hooklying Single Knee to Chest Stretch  - 1 x daily - 7 x weekly - 1 sets - 10 reps - 10 hold - Sit to Stand with Counter Support  - 3 x daily - 7 x weekly - 2 sets - 10 reps - 5 hold - Supine Bridge  - 1 x daily - 7 x weekly - 1-2 sets - 10 reps - 3-5 hold - Hooklying Clamshell with Resistance  - 2 x daily - 7 x weekly - 1-2 sets - 10 reps - slow hold - Standing Tandem Balance with Counter Support  - 1 x daily - 7 x weekly -  1 sets - 3 reps - 10-15 hold - Standing Hip Abduction with Counter Support  - 1 x daily - 7 x weekly - 2 sets - 10 reps - Seated Hamstring Stretch  - 1 x daily - 7 x weekly - 1 sets - 5 reps - 30 hold - Supine Piriformis Stretch  - 1 x daily - 7 x weekly - 1 sets - 3 reps - 30 hold - Seated Figure 4 Piriformis Stretch  - 1 x daily - 7 x weekly - 1 sets - 3 reps - 30 hold   ASSESSMENT:   CLINICAL IMPRESSION: Increased focus on neuro re-ed today with good tolerance.  Progressing toward BERG score goal. She will cont to benefit from continued gait and balance work as well as posterior hip strength to optimize outcomes.    OBJECTIVE IMPAIRMENTS: Abnormal gait, decreased activity tolerance, decreased balance, decreased coordination, decreased knowledge of use of DME, decreased mobility, difficulty walking, decreased ROM, decreased strength, decreased safety awareness, increased fascial restrictions, improper body mechanics,  postural dysfunction, and pain.    ACTIVITY LIMITATIONS: carrying, lifting, bending, standing, squatting, sleeping, stairs, transfers, bed mobility, and locomotion level   PARTICIPATION LIMITATIONS: meal prep, cleaning, laundry, shopping, community activity, and church   PERSONAL FACTORS: Age and 1-2 comorbidities: osteoporosis, precious back surgeries  are also affecting patient's functional outcome.    REHAB POTENTIAL: Good   CLINICAL DECISION MAKING: Evolving/moderate complexity   EVALUATION COMPLEXITY: Moderate     GOALS:     LONG TERM GOALS: Target date:09/27/22     Pt will be I with HEP Baseline: given basic on eval  Goal status: ongoing  2.  Pt will complete balance testing and goal set Baseline: TUG, 22 sec, Berg 42/56 - GOAL is 48/56  Goal status: NEW GOAL    3.  Pt will be able to safely Sit to stand without compensations in 15 sec or less  Baseline: Increased postural sway from elevated mat table.  Needs close supervision due to momentum when rising.  Goal status: ongoing    4.  Pt will be able to ambulate with cane without cues and mod I , 300 feet while carrying handbag  Baseline: mod cues , min A given on Re-assessment (6/24) Goal status:ongoing     5. Pt will not be limited by pain in her back for home tasks  Baseline:  Goal status:ongoing      PLAN:   PT FREQUENCY: 1-2x/week   PT DURATION: 6 weeks   PLANNED INTERVENTIONS: Therapeutic exercises, Therapeutic activity, Neuromuscular re-education, Balance training, Gait training, Patient/Family education, Self Care, Stair training, DME instructions, Cryotherapy, Moist heat, Manual therapy, and Re-evaluation.    PLAN FOR NEXT SESSION: balance work, obstacles , HEP, more hip stab and stretches/ manual to reduce pain.   Jannette Spanner, PTA 09/06/22 11:51 AM Phone: [phone] Fax: [phone]

## 2022-09-08 DIAGNOSIS — N3020 Other chronic cystitis without hematuria: Secondary | ICD-10-CM | POA: Diagnosis not present

## 2022-09-09 ENCOUNTER — Ambulatory Visit: Payer: Medicare Other | Admitting: Physical Therapy

## 2022-09-09 DIAGNOSIS — M25552 Pain in left hip: Secondary | ICD-10-CM | POA: Diagnosis not present

## 2022-09-09 DIAGNOSIS — R262 Difficulty in walking, not elsewhere classified: Secondary | ICD-10-CM

## 2022-09-09 DIAGNOSIS — M5459 Other low back pain: Secondary | ICD-10-CM

## 2022-09-09 DIAGNOSIS — R293 Abnormal posture: Secondary | ICD-10-CM | POA: Diagnosis not present

## 2022-09-09 DIAGNOSIS — M6281 Muscle weakness (generalized): Secondary | ICD-10-CM

## 2022-09-09 DIAGNOSIS — G8929 Other chronic pain: Secondary | ICD-10-CM | POA: Diagnosis not present

## 2022-09-09 DIAGNOSIS — M5442 Lumbago with sciatica, left side: Secondary | ICD-10-CM | POA: Diagnosis not present

## 2022-09-09 DIAGNOSIS — R2689 Other abnormalities of gait and mobility: Secondary | ICD-10-CM

## 2022-09-09 NOTE — Therapy (Signed)
OUTPATIENT PHYSICAL THERAPY TREATMENT NOTE    Patient Name: Danielle Hernandez MRN: [id number] DOB:23-Jan-1936, 87 y.o., female Today's Date: 09/09/2022  PCP: Dr. Lupita Raider   REFERRING PROVIDER: Tressie Stalker, MD  END OF SESSION:   PT End of Session - 09/09/22 0933     Visit Number 16    Number of Visits 24    Date for PT Re-Evaluation 09/27/22    Authorization Type [employer]    Progress Note Due on Visit 10    PT Start Time 0931    PT Stop Time 1015    PT Time Calculation (min) 44 min    Activity Tolerance Patient tolerated treatment well    Behavior During Therapy [facility] for tasks assessed/performed                 Past Medical History:  Diagnosis Date   Arthritis    Headache(784.0)    HTN (hypertension)    Osteoporosis    Raynaud disease    Past Surgical History:  Procedure Laterality Date   ABDOMINAL HYSTERECTOMY     BACK SURGERY     EYE SURGERY     Bilateral cataracts   LUMBAR LAMINECTOMY/DECOMPRESSION MICRODISCECTOMY Bilateral 11/02/2021   Procedure: T12-L1, L1-L2 LAM,FORAM;  Surgeon: Tressie Stalker, MD;  Location: [facility] OR;  Service: Neurosurgery;  Laterality: Bilateral;  3C   TONSILLECTOMY     under chin cyst removed     WRIST GANGLION EXCISION     right hand   Patient Active Problem List   Diagnosis Date Noted   Myelopathy concurrent with and due to spinal stenosis of thoracic region (HCC) 11/02/2021   CDNH (chondrodermatitis nodularis helicis), right 02/21/2018   Impacted cerumen of left ear 03/04/2016   Fecal incontinence 04/26/2012   Lumbar stenosis 02/08/2011    REFERRING DIAG: spinal stenosis s/p L1-L2 laminotomy /foraminotomy   THERAPY DIAG:  No diagnosis found.  Rationale for Evaluation and Treatment Rehabilitation  PERTINENT HISTORY:  Osteoporosis  Arthritis Raynaud HTN 11/02/21: T12-L1, L1-L2 Laminectomy, decompression, microdisc.  L shoulder fx and L wrist fx s/p falls  2013: 3 level PLIF   PRECAUTIONS:  Fall  SUBJECTIVE:                                                                                                                                                                                      SUBJECTIVE STATEMENT: I am OK.  No pain at rest, back is good. Same buttock pain as always,  it hurts all the time.     PAIN:  Are you having pain? Yes: NPRS scale: 0/10 back at rest .  L/R hamstring/hip 6/10.  Pain  location: back of  L leg mostly , now on right too Pain description: stiff, achy, weak Aggravating factors: getting out of bed  Relieving factors: sitting to do her housework    OBJECTIVE: (objective measures completed at initial evaluation unless otherwise dated)   DIAGNOSTIC FINDINGS:  None recent    PATIENT SURVEYS:  FOTO 54% 07/05/22   FOTO     07/19/22  54% FOTO 52% 08/16/22    SCREENING FOR RED FLAGS: Bowel or bladder incontinence: Yes: premorbid Spinal tumors: No Cauda equina syndrome: No Compression fracture: No Abdominal aneurysm: No   COGNITION: Overall cognitive status: Within functional limits for tasks assessed                          SENSATION: WFL   MUSCLE LENGTH: Hamstrings: tight Thomas test: NT    POSTURE: rounded shoulders, forward head, and left pelvic obliquity High L hip , leans Rt    PALPATION: TTP Lt. lumbar    LUMBAR ROM:    AROM eval  Flexion deferred  Extension    Right lateral flexion    Left lateral flexion    Right rotation 25%  Left rotation 25%   (Blank rows = not tested)   LOWER EXTREMITY ROM:      Passive  Right eval Left  eval  Hip flexion WNL WNL   Hip extension      Hip abduction      Hip adduction      Hip internal rotation 15-20 deg 25 deg   Hip external rotation 25 deg  30 deg   Knee flexion WNL WNL   Knee extension      Ankle dorsiflexion stiff stiff  Ankle plantarflexion      Ankle inversion      Ankle eversion       (Blank rows = not tested)   LOWER EXTREMITY MMT:     MMT Right eval  Left eval 07/12/22 right 07/12/22 left 09/01/22  Hip flexion 4 4+   4+, 4  Hip extension         Hip abduction     3 3- 3+, 3-  Hip adduction         Hip internal rotation         Hip external rotation         Knee flexion 4 4   5, 4+  Knee extension 4 4   5, 4  Ankle dorsiflexion 4+ 3+   5, 3+  Ankle plantarflexion         Ankle inversion         Ankle eversion          (Blank rows = not tested)   LUMBAR SPECIAL TESTS:  NT    FUNCTIONAL TESTS:  5 times sit to stand: 10.3 30 seconds chair stand test 9 reps, max cues to avoid using back of legs on the mat  07/05/22: TUG 22.19 sec with SPC 07/19/22: Berg 42/56 09/01/22: Sharlene Motts 47/56   GAIT: Distance walked: 150 Assistive device utilized: Single point cane and without Level of assistance: Min A pt needs close supervision with cane, min A without  Comments: foot catches on the floor, min A to recover balance   TODAY'S TREATMENT:        [facility] Adult PT Treatment:  DATE: 09/09/22 Therapeutic Exercise: Standing hip abduction one foot on Airex standing max cues to avoid using ant hip.  High knee march  Bilateral clam red x 20  Banded bridge Bridge with march red band, alternating  Dynamic hamstring with knee to chest x 10 and ankle DF Figure 4 with cues  Therapeutic Activity: Sidestepping with obstacles  Step overs x 15 Standing on Airex holding red wgted ball : overhead press , upper trunk rotation and unilateral pass  [facility] Adult PT Treatment:                                                DATE: 09/06/22 Therapeutic Exercise: Nustep L5  STS x 15 Seated h/s stretch bilat x 2 each  Neuromuscular re-ed: Alternating step taps without UE Alternating step taps from AIREX with out UE- CGA L SLS from AIREX with hip abduction- bilat UE SLS trials 2-3 se best  SLS trials on AIREX 2 sec best  AIREX stepping on and off without UE AIREX narrow stance with therapist pertubations    [facility]  Adult PT Treatment:                                                DATE: 09/01/22 Therapeutic Exercise: NuStep L 5 UE and LE for 6 min  MMT see above  Clam in sidelying x 15 mod cues  Therapeutic Activity: BERG BALANCE  Sitting to Standing: Numbers; 0-4: 4  4. Stands without using hands and stabilize independently  3. Stands independently using hands  2. Stands using hands after multiple trials  1. Min A to stand  0. Mod-Max A to stand Standing unsupported: Numbers; 0-4: 4  4. Stands safely for 2 minutes  3. Stands 2 minutes with supervision  2. Stands 30 seconds unsupported  1. Needs several tries to stand unsupported for 30 seconds  0. Unable to stand unsupported for 30 seconds Sitting unsupported: Numbers; 0-4: 4  4. Sits for 2 minutes independently  3. Sits for 2 minutes with supervision  2. Able to sit 30 seconds  1. Able to sit 10 seconds  0. Unable to sit for 10 seconds Standing to Sitting: Numbers; 0-4: 4 4. Sits safely with minimal use of hands 3. Controls descent with hands 2. Uses back of legs against chair to control descent 1. Sits independently, but uncontrolled descent 0. Needs assistance Transfers: Numbers; 0-4: 4  4. Transfers safely with minor use of hands  3. Transfers safely definite use of hands  2. Transfers with verbal cueing/supervision  1. Needs 1 person assist  0. Needs 2 person assist  Standing with eyes closed: Numbers; 0-4: 4  4. Stands safely for 10 seconds  3. Stands 10 seconds with supervision   2. Able to stand for 3 seconds  1. Unable to keep eyes closed for 3 seconds, but is safe  0. Needs assist to keep from falling Standing with feet together: Numbers; 0-4: 4 4. Stands for 1 minute safely 3. Stands for 1 minute with supervision 2. Unable to hold for 30 seconds  1. Needs help to attain position but can hold for 15 seconds  0. Needs help to attain position and unable to hold for 15  seconds Reaching forward with outstretched arm:  Numbers; 0-4: 3  4. Reaches forward 10 inches  3. Reaches forward 5 inches  2. Reaches forward 2 inches  1. Reaches forward with supervision  0. Loses balance/requires assistace Retrieving object from the floor: Numbers; 0-4: 4 4. Able to pick up easily and safely 3. Able to pick up with supervision 2. Unable to pick up, but reaches within 1-2 inches independently 1. Unable to pick up and needs supervision 0. Unable/needs assistance to keep from falling  Turning to look behind: Numbers; 0-4: 3  4. Looks behind from both sides and weight shifts well  3. Looks behind one side only, other side less weight shift  2. Turns sideways only, maintains balance  1. Needs supervision when turning  0. Needs assistance  Turning 360 degrees: Numbers; 0-4: 2  4. Able to turn in </=4 seconds  3. Able to turn on one side in </= 4 seconds   2. Able to turn slowly, but safely  1. Needs supervision or verbal cueing  0. Needs assistance Place alternate foot on stool: Numbers; 0-4: 3 4. Completes 8 steps in 20 seconds 3. Completes 8 steps in >20 seconds 2. 4 steps without assistance/supervision 1. Completes >2 steps with minimal assist 0. Unable, needs assist to keep from falling Standing with one foot in front: Numbers; 0-4: 2  4. Independent tandem for 30 seconds  3. Independent foot ahead for 30 seconds  2. Independent small step for 30 seconds  1. Needs help to step, but can hold for 15 seconds  0. Loses balance while standing/stepping Standing on one foot: Numbers; 0-4: 2 4. Holds >10 seconds 3. Holds 5-10 seconds 2. Holds >/=3 seconds  1. Holds <3 seconds 0. Unable   Total Score: 47/56    [facility] Adult PT Treatment:                                                DATE: 08/30/22 Therapeutic Exercise: Nustep 5 minutes Standing hip abdct yellow  4 inch lateral step up Heel raises x 20  Therapeutic Activity: Gait around obstacles- figure 8s Stepping over obstacles Tandem gait on tandem  AIREX -1 Ue support at counter Tandem stance on tandem AIREX - needs light touch Gait with head turns   [facility] Adult PT Treatment:                                                DATE: 08/16/22 Therapeutic Exercise: Standing hip abdct yellow band at knees 10 x 2  Standing hip ext yellow band at knees 10 x 2  Bilat heel raises  Tandem trial 5-10 sec best Nustep L4 UE /LE x 5 minutes  Therapeutic Activity: Gait with head turns Gait with nods Gait with eyes closed  Stepping over hurdles step- to and alternating - needs UE to alternate     PATIENT EDUCATION:  Education details: balance testing, progress Person educated: Patient Education method: Explanation, Demonstration, Verbal cues, and Handouts Education comprehension: verbalized understanding and needs further education   HOME EXERCISE PROGRAM: Access Code: [email or web address] URL: [email or web address] Date: 06/10/2022 Prepared by: Karie Mainland   Access Code: [email or web address] URL: [email or web address] Date: 07/26/2022 Prepared by: Karie Mainland  Exercises -  Supine Lower Trunk Rotation  - 1 x daily - 7 x weekly - 2 sets - 10 reps - 10 hold - Hooklying Single Knee to Chest Stretch  - 1 x daily - 7 x weekly - 1 sets - 10 reps - 10 hold - Sit to Stand with Counter Support  - 3 x daily - 7 x weekly - 2 sets - 10 reps - 5 hold - Supine Bridge  - 1 x daily - 7 x weekly - 1-2 sets - 10 reps - 3-5 hold - Hooklying Clamshell with Resistance  - 2 x daily - 7 x weekly - 1-2 sets - 10 reps - slow hold - Standing Tandem Balance with Counter Support  - 1 x daily - 7 x weekly - 1 sets - 3 reps - 10-15 hold - Standing Hip Abduction with Counter Support  - 1 x daily - 7 x weekly - 2 sets - 10 reps - Seated Hamstring Stretch  - 1 x daily - 7 x weekly - 1 sets - 5 reps - 30 hold - Supine Piriformis Stretch  - 1 x daily - 7 x weekly - 1 sets - 3 reps - 30 hold - Seated Figure 4 Piriformis Stretch  - 1 x daily - 7 x weekly - 1 sets -  3 reps - 30 hold   ASSESSMENT:   CLINICAL IMPRESSION: Patient with some difficulty stepping over obstacles but when asked to move laterally she compensates and needs light A to maintain balance.  Weakness in hip abduction evident with exercises today.  She will benefit from a few more visits to help with balance and gait stability.  She is able to feel a stretch with figure 4 and hamstring length but she does not have any relief of the constant buttock pain.   OBJECTIVE IMPAIRMENTS: Abnormal gait, decreased activity tolerance, decreased balance, decreased coordination, decreased knowledge of use of DME, decreased mobility, difficulty walking, decreased ROM, decreased strength, decreased safety awareness, increased fascial restrictions, improper body mechanics, postural dysfunction, and pain.    ACTIVITY LIMITATIONS: carrying, lifting, bending, standing, squatting, sleeping, stairs, transfers, bed mobility, and locomotion level   PARTICIPATION LIMITATIONS: meal prep, cleaning, laundry, shopping, community activity, and church   PERSONAL FACTORS: Age and 1-2 comorbidities: osteoporosis, precious back surgeries  are also affecting patient's functional outcome.    REHAB POTENTIAL: Good   CLINICAL DECISION MAKING: Evolving/moderate complexity   EVALUATION COMPLEXITY: Moderate     GOALS:     LONG TERM GOALS: Target date:09/27/22     Pt will be I with HEP Baseline: given basic on eval  Goal status: ongoing  2.  Pt will complete balance testing and goal set Baseline: TUG, 22 sec, Berg 42/56 - GOAL is 48/56  Goal status: NEW GOAL    3.  Pt will be able to safely Sit to stand without compensations in 15 sec or less  Baseline: Increased postural sway from elevated mat table.  Needs close supervision due to momentum when rising.  Goal status: ongoing    4.  Pt will be able to ambulate with cane without cues and mod I , 300 feet while carrying handbag  Baseline: mod cues , min A given on  Re-assessment (6/24) Goal status:ongoing     5. Pt will not be limited by pain in her back for home tasks  Baseline:  Goal status:ongoing      PLAN:   PT FREQUENCY: 1-2x/week   PT DURATION: 6 weeks  PLANNED INTERVENTIONS: Therapeutic exercises, Therapeutic activity, Neuromuscular re-education, Balance training, Gait training, Patient/Family education, Self Care, Stair training, DME instructions, Cryotherapy, Moist heat, Manual therapy, and Re-evaluation.    PLAN FOR NEXT SESSION: balance work, obstacles , HEP, more hip stab and stretches/ manual to reduce pain.   Karie Mainland, PT 09/09/22 11:15 AM Phone: [phone] Fax: [phone]

## 2022-09-10 DIAGNOSIS — M4800 Spinal stenosis, site unspecified: Secondary | ICD-10-CM | POA: Diagnosis not present

## 2022-09-13 ENCOUNTER — Ambulatory Visit: Payer: Medicare Other | Admitting: Physical Therapy

## 2022-09-13 ENCOUNTER — Encounter: Payer: Self-pay | Admitting: Physical Therapy

## 2022-09-13 DIAGNOSIS — R2689 Other abnormalities of gait and mobility: Secondary | ICD-10-CM | POA: Diagnosis not present

## 2022-09-13 DIAGNOSIS — M5442 Lumbago with sciatica, left side: Secondary | ICD-10-CM | POA: Diagnosis not present

## 2022-09-13 DIAGNOSIS — R262 Difficulty in walking, not elsewhere classified: Secondary | ICD-10-CM | POA: Diagnosis not present

## 2022-09-13 DIAGNOSIS — R293 Abnormal posture: Secondary | ICD-10-CM | POA: Diagnosis not present

## 2022-09-13 DIAGNOSIS — M25552 Pain in left hip: Secondary | ICD-10-CM | POA: Diagnosis not present

## 2022-09-13 DIAGNOSIS — G8929 Other chronic pain: Secondary | ICD-10-CM | POA: Diagnosis not present

## 2022-09-13 DIAGNOSIS — M6281 Muscle weakness (generalized): Secondary | ICD-10-CM

## 2022-09-13 DIAGNOSIS — M5459 Other low back pain: Secondary | ICD-10-CM | POA: Diagnosis not present

## 2022-09-13 NOTE — Therapy (Signed)
OUTPATIENT PHYSICAL THERAPY TREATMENT NOTE    Patient Name: Danielle Hernandez MRN: [id number] DOB:Jan 09, 1936, 87 y.o., female Today's Date: 09/13/2022  PCP: Dr. Lupita Raider   REFERRING PROVIDER: Tressie Stalker, MD  END OF SESSION:   PT End of Session - 09/13/22 1109     Visit Number 17    Number of Visits 24    Date for PT Re-Evaluation 09/27/22    Authorization Type [employer]    Progress Note Due on Visit 10    PT Start Time 1108   pt arrived late   PT Stop Time 1146    PT Time Calculation (min) 38 min                 Past Medical History:  Diagnosis Date   Arthritis    Headache(784.0)    HTN (hypertension)    Osteoporosis    Raynaud disease    Past Surgical History:  Procedure Laterality Date   ABDOMINAL HYSTERECTOMY     BACK SURGERY     EYE SURGERY     Bilateral cataracts   LUMBAR LAMINECTOMY/DECOMPRESSION MICRODISCECTOMY Bilateral 11/02/2021   Procedure: T12-L1, L1-L2 LAM,FORAM;  Surgeon: Tressie Stalker, MD;  Location: [facility] OR;  Service: Neurosurgery;  Laterality: Bilateral;  3C   TONSILLECTOMY     under chin cyst removed     WRIST GANGLION EXCISION     right hand   Patient Active Problem List   Diagnosis Date Noted   Myelopathy concurrent with and due to spinal stenosis of thoracic region (HCC) 11/02/2021   CDNH (chondrodermatitis nodularis helicis), right 02/21/2018   Impacted cerumen of left ear 03/04/2016   Fecal incontinence 04/26/2012   Lumbar stenosis 02/08/2011    REFERRING DIAG: spinal stenosis s/p L1-L2 laminotomy /foraminotomy   THERAPY DIAG:  Difficulty in walking, not elsewhere classified  Muscle weakness (generalized)  Rationale for Evaluation and Treatment Rehabilitation  PERTINENT HISTORY:  Osteoporosis  Arthritis Raynaud HTN 11/02/21: T12-L1, L1-L2 Laminectomy, decompression, microdisc.  L shoulder fx and L wrist fx s/p falls  2013: 3 level PLIF   PRECAUTIONS: Fall  SUBJECTIVE:                                                                                                                                                                                       SUBJECTIVE STATEMENT: I am OK.  No pain at rest, back is good. Same buttock pain as always,  it hurts all the time.     PAIN:  Are you having pain? Yes: NPRS scale: 0/10 back at rest .  L/R hamstring/hip 6/10.  Pain location: back of  L leg mostly ,  now on right too Pain description: stiff, achy, weak Aggravating factors: getting out of bed  Relieving factors: sitting to do her housework    OBJECTIVE: (objective measures completed at initial evaluation unless otherwise dated)   DIAGNOSTIC FINDINGS:  None recent    PATIENT SURVEYS:  FOTO 54% 07/05/22   FOTO     07/19/22  54% FOTO 52% 08/16/22    SCREENING FOR RED FLAGS: Bowel or bladder incontinence: Yes: premorbid Spinal tumors: No Cauda equina syndrome: No Compression fracture: No Abdominal aneurysm: No   COGNITION: Overall cognitive status: Within functional limits for tasks assessed                          SENSATION: WFL   MUSCLE LENGTH: Hamstrings: tight Thomas test: NT    POSTURE: rounded shoulders, forward head, and left pelvic obliquity High L hip , leans Rt    PALPATION: TTP Lt. lumbar    LUMBAR ROM:    AROM eval  Flexion deferred  Extension    Right lateral flexion    Left lateral flexion    Right rotation 25%  Left rotation 25%   (Blank rows = not tested)   LOWER EXTREMITY ROM:      Passive  Right eval Left  eval  Hip flexion WNL WNL   Hip extension      Hip abduction      Hip adduction      Hip internal rotation 15-20 deg 25 deg   Hip external rotation 25 deg  30 deg   Knee flexion WNL WNL   Knee extension      Ankle dorsiflexion stiff stiff  Ankle plantarflexion      Ankle inversion      Ankle eversion       (Blank rows = not tested)   LOWER EXTREMITY MMT:     MMT Right eval Left eval 07/12/22 right 07/12/22 left 09/01/22  Hip  flexion 4 4+   4+, 4  Hip extension         Hip abduction     3 3- 3+, 3-  Hip adduction         Hip internal rotation         Hip external rotation         Knee flexion 4 4   5, 4+  Knee extension 4 4   5, 4  Ankle dorsiflexion 4+ 3+   5, 3+  Ankle plantarflexion         Ankle inversion         Ankle eversion          (Blank rows = not tested)   LUMBAR SPECIAL TESTS:  NT    FUNCTIONAL TESTS:  5 times sit to stand: 10.3 30 seconds chair stand test 9 reps, max cues to avoid using back of legs on the mat  07/05/22: TUG 22.19 sec with SPC 07/19/22: Berg 42/56 09/01/22: Sharlene Motts 47/56   GAIT: Distance walked: 150 Assistive device utilized: Single point cane and without Level of assistance: Min A pt needs close supervision with cane, min A without  Comments: foot catches on the floor, min A to recover balance   TODAY'S TREATMENT:      [facility] Adult PT Treatment:  DATE: 09/13/22 Therapeutic Exercise: Nustep L4 UE/LE x 5 min Heel raises 10 x 2  Lateral step up/ down to AIREX pad x 10 R/ x10 LT Alteranting step taps from AIREX to 6 inch step Supine clam with red band  Banded bridge red  Supine march red band  S/L clam red  Figure 4 stretch push and pull Dynamic hamstring stretch x 10 each with strap   [facility] Adult PT Treatment:                                                DATE: 09/09/22 Therapeutic Exercise: Standing hip abduction one foot on Airex standing max cues to avoid using ant hip.  High knee march  Bilateral clam red x 20  Banded bridge Bridge with march red band, alternating  Dynamic hamstring with knee to chest x 10 and ankle DF Figure 4 with cues  Therapeutic Activity: Sidestepping with obstacles  Step overs x 15 Standing on Airex holding red wgted ball : overhead press , upper trunk rotation and unilateral pass  [facility] Adult PT Treatment:                                                DATE: 09/06/22 Therapeutic  Exercise: Nustep L5  STS x 15 Seated h/s stretch bilat x 2 each  Neuromuscular re-ed: Alternating step taps without UE Alternating step taps from AIREX with out UE- CGA L SLS from AIREX with hip abduction- bilat UE SLS trials 2-3 se best  SLS trials on AIREX 2 sec best  AIREX stepping on and off without UE AIREX narrow stance with therapist pertubations    [facility] Adult PT Treatment:                                                DATE: 09/01/22 Therapeutic Exercise: NuStep L 5 UE and LE for 6 min  MMT see above  Clam in sidelying x 15 mod cues  Therapeutic Activity: BERG BALANCE  Sitting to Standing: Numbers; 0-4: 4  4. Stands without using hands and stabilize independently  3. Stands independently using hands  2. Stands using hands after multiple trials  1. Min A to stand  0. Mod-Max A to stand Standing unsupported: Numbers; 0-4: 4  4. Stands safely for 2 minutes  3. Stands 2 minutes with supervision  2. Stands 30 seconds unsupported  1. Needs several tries to stand unsupported for 30 seconds  0. Unable to stand unsupported for 30 seconds Sitting unsupported: Numbers; 0-4: 4  4. Sits for 2 minutes independently  3. Sits for 2 minutes with supervision  2. Able to sit 30 seconds  1. Able to sit 10 seconds  0. Unable to sit for 10 seconds Standing to Sitting: Numbers; 0-4: 4 4. Sits safely with minimal use of hands 3. Controls descent with hands 2. Uses back of legs against chair to control descent 1. Sits independently, but uncontrolled descent 0. Needs assistance Transfers: Numbers; 0-4: 4  4. Transfers safely with minor use of hands  3. Transfers safely definite use of hands  2. Transfers with verbal cueing/supervision  1. Needs 1 person assist  0. Needs 2 person assist  Standing with eyes closed: Numbers; 0-4: 4  4. Stands safely for 10 seconds  3. Stands 10 seconds with supervision   2. Able to stand for 3 seconds  1. Unable to keep eyes closed for 3 seconds,  but is safe  0. Needs assist to keep from falling Standing with feet together: Numbers; 0-4: 4 4. Stands for 1 minute safely 3. Stands for 1 minute with supervision 2. Unable to hold for 30 seconds  1. Needs help to attain position but can hold for 15 seconds  0. Needs help to attain position and unable to hold for 15 seconds Reaching forward with outstretched arm: Numbers; 0-4: 3  4. Reaches forward 10 inches  3. Reaches forward 5 inches  2. Reaches forward 2 inches  1. Reaches forward with supervision  0. Loses balance/requires assistace Retrieving object from the floor: Numbers; 0-4: 4 4. Able to pick up easily and safely 3. Able to pick up with supervision 2. Unable to pick up, but reaches within 1-2 inches independently 1. Unable to pick up and needs supervision 0. Unable/needs assistance to keep from falling  Turning to look behind: Numbers; 0-4: 3  4. Looks behind from both sides and weight shifts well  3. Looks behind one side only, other side less weight shift  2. Turns sideways only, maintains balance  1. Needs supervision when turning  0. Needs assistance  Turning 360 degrees: Numbers; 0-4: 2  4. Able to turn in </=4 seconds  3. Able to turn on one side in </= 4 seconds   2. Able to turn slowly, but safely  1. Needs supervision or verbal cueing  0. Needs assistance Place alternate foot on stool: Numbers; 0-4: 3 4. Completes 8 steps in 20 seconds 3. Completes 8 steps in >20 seconds 2. 4 steps without assistance/supervision 1. Completes >2 steps with minimal assist 0. Unable, needs assist to keep from falling Standing with one foot in front: Numbers; 0-4: 2  4. Independent tandem for 30 seconds  3. Independent foot ahead for 30 seconds  2. Independent small step for 30 seconds  1. Needs help to step, but can hold for 15 seconds  0. Loses balance while standing/stepping Standing on one foot: Numbers; 0-4: 2 4. Holds >10 seconds 3. Holds 5-10 seconds 2. Holds  >/=3 seconds  1. Holds <3 seconds 0. Unable   Total Score: 47/56    [facility] Adult PT Treatment:                                                DATE: 08/30/22 Therapeutic Exercise: Nustep 5 minutes Standing hip abdct yellow  4 inch lateral step up Heel raises x 20  Therapeutic Activity: Gait around obstacles- figure 8s Stepping over obstacles Tandem gait on tandem AIREX -1 Ue support at counter Tandem stance on tandem AIREX - needs light touch Gait with head turns   [facility] Adult PT Treatment:                                                DATE: 08/16/22 Therapeutic Exercise: Standing hip abdct yellow band  at knees 10 x 2  Standing hip ext yellow band at knees 10 x 2  Bilat heel raises  Tandem trial 5-10 sec best Nustep L4 UE /LE x 5 minutes  Therapeutic Activity: Gait with head turns Gait with nods Gait with eyes closed  Stepping over hurdles step- to and alternating - needs UE to alternate     PATIENT EDUCATION:  Education details: balance testing, progress Person educated: Patient Education method: Explanation, Demonstration, Verbal cues, and Handouts Education comprehension: verbalized understanding and needs further education   HOME EXERCISE PROGRAM: Access Code: [email or web address] URL: [email or web address] Date: 06/10/2022 Prepared by: Karie Mainland   Access Code: [email or web address] URL: [email or web address] Date: 07/26/2022 Prepared by: Karie Mainland  Exercises - Supine Lower Trunk Rotation  - 1 x daily - 7 x weekly - 2 sets - 10 reps - 10 hold - Hooklying Single Knee to Chest Stretch  - 1 x daily - 7 x weekly - 1 sets - 10 reps - 10 hold - Sit to Stand with Counter Support  - 3 x daily - 7 x weekly - 2 sets - 10 reps - 5 hold - Supine Bridge  - 1 x daily - 7 x weekly - 1-2 sets - 10 reps - 3-5 hold - Hooklying Clamshell with Resistance  - 2 x daily - 7 x weekly - 1-2 sets - 10 reps - slow hold - Standing Tandem Balance with Counter Support  - 1 x  daily - 7 x weekly - 1 sets - 3 reps - 10-15 hold - Standing Hip Abduction with Counter Support  - 1 x daily - 7 x weekly - 2 sets - 10 reps - Seated Hamstring Stretch  - 1 x daily - 7 x weekly - 1 sets - 5 reps - 30 hold - Supine Piriformis Stretch  - 1 x daily - 7 x weekly - 1 sets - 3 reps - 30 hold - Seated Figure 4 Piriformis Stretch  - 1 x daily - 7 x weekly - 1 sets - 3 reps - 30 hold   ASSESSMENT:   CLINICAL IMPRESSION: Patient with some difficulty stepping on and off airex pad laterally. Continued to work on hip abduction strengthening. She will benefit from a few more visits to help with balance and gait stability.  She is able to feel a stretch with figure 4 and hamstring length but she does not have any relief of the constant buttock pain.   OBJECTIVE IMPAIRMENTS: Abnormal gait, decreased activity tolerance, decreased balance, decreased coordination, decreased knowledge of use of DME, decreased mobility, difficulty walking, decreased ROM, decreased strength, decreased safety awareness, increased fascial restrictions, improper body mechanics, postural dysfunction, and pain.    ACTIVITY LIMITATIONS: carrying, lifting, bending, standing, squatting, sleeping, stairs, transfers, bed mobility, and locomotion level   PARTICIPATION LIMITATIONS: meal prep, cleaning, laundry, shopping, community activity, and church   PERSONAL FACTORS: Age and 1-2 comorbidities: osteoporosis, precious back surgeries  are also affecting patient's functional outcome.    REHAB POTENTIAL: Good   CLINICAL DECISION MAKING: Evolving/moderate complexity   EVALUATION COMPLEXITY: Moderate     GOALS:     LONG TERM GOALS: Target date:09/27/22     Pt will be I with HEP Baseline: given basic on eval  Goal status: ongoing  2.  Pt will complete balance testing and goal set Baseline: TUG, 22 sec, Berg 42/56 - GOAL is 48/56  Goal status: NEW GOAL    3.  Pt will be able to safely Sit  to stand without  compensations in 15 sec or less  Baseline: Increased postural sway from elevated mat table.  Needs close supervision due to momentum when rising.  Goal status: ongoing    4.  Pt will be able to ambulate with cane without cues and mod I , 300 feet while carrying handbag  Baseline: mod cues , min A given on Re-assessment (6/24) Goal status:ongoing     5. Pt will not be limited by pain in her back for home tasks  Baseline:  Goal status:ongoing      PLAN:   PT FREQUENCY: 1-2x/week   PT DURATION: 6 weeks   PLANNED INTERVENTIONS: Therapeutic exercises, Therapeutic activity, Neuromuscular re-education, Balance training, Gait training, Patient/Family education, Self Care, Stair training, DME instructions, Cryotherapy, Moist heat, Manual therapy, and Re-evaluation.    PLAN FOR NEXT SESSION: balance work, obstacles , HEP, more hip stab and stretches/ manual to reduce pain.   Jannette Spanner, PTA 09/13/22 11:46 AM Phone: [phone] Fax: [phone]

## 2022-09-16 ENCOUNTER — Encounter: Payer: Self-pay | Admitting: Physical Therapy

## 2022-09-16 ENCOUNTER — Ambulatory Visit: Payer: Medicare Other | Admitting: Physical Therapy

## 2022-09-16 DIAGNOSIS — M6281 Muscle weakness (generalized): Secondary | ICD-10-CM

## 2022-09-16 DIAGNOSIS — R262 Difficulty in walking, not elsewhere classified: Secondary | ICD-10-CM

## 2022-09-16 DIAGNOSIS — M25552 Pain in left hip: Secondary | ICD-10-CM | POA: Diagnosis not present

## 2022-09-16 DIAGNOSIS — M5459 Other low back pain: Secondary | ICD-10-CM

## 2022-09-16 DIAGNOSIS — G8929 Other chronic pain: Secondary | ICD-10-CM | POA: Diagnosis not present

## 2022-09-16 DIAGNOSIS — R2689 Other abnormalities of gait and mobility: Secondary | ICD-10-CM | POA: Diagnosis not present

## 2022-09-16 DIAGNOSIS — R293 Abnormal posture: Secondary | ICD-10-CM | POA: Diagnosis not present

## 2022-09-16 DIAGNOSIS — M5442 Lumbago with sciatica, left side: Secondary | ICD-10-CM | POA: Diagnosis not present

## 2022-09-16 NOTE — Therapy (Signed)
OUTPATIENT PHYSICAL THERAPY TREATMENT NOTE    Patient Name: Danielle Hernandez MRN: [id number] DOB:[birth year], 87 y.o., female Today's Date: 09/16/2022  PCP: Dr. Lupita Raider   REFERRING PROVIDER: Tressie Stalker, MD  END OF SESSION:   PT End of Session - 09/16/22 1112     Visit Number 18    Number of Visits 24    Date for PT Re-Evaluation 09/27/22    Authorization Type [employer]    PT Start Time 1100    PT Stop Time 1145    PT Time Calculation (min) 45 min    Activity Tolerance Patient tolerated treatment well    Behavior During Therapy WFL for tasks assessed/performed                  Past Medical History:  Diagnosis Date   Arthritis    Headache(784.0)    HTN (hypertension)    Osteoporosis    Raynaud disease    Past Surgical History:  Procedure Laterality Date   ABDOMINAL HYSTERECTOMY     BACK SURGERY     EYE SURGERY     Bilateral cataracts   LUMBAR LAMINECTOMY/DECOMPRESSION MICRODISCECTOMY Bilateral 11/02/2021   Procedure: T12-L1, L1-L2 LAM,FORAM;  Surgeon: Tressie Stalker, MD;  Location: [facility] OR;  Service: Neurosurgery;  Laterality: Bilateral;  3C   TONSILLECTOMY     under chin cyst removed     WRIST GANGLION EXCISION     right hand   Patient Active Problem List   Diagnosis Date Noted   Myelopathy concurrent with and due to spinal stenosis of thoracic region (HCC) 11/02/2021   CDNH (chondrodermatitis nodularis helicis), right 02/21/2018   Impacted cerumen of left ear 03/04/2016   Fecal incontinence 04/26/2012   Lumbar stenosis 02/08/2011    REFERRING DIAG: spinal stenosis s/p L1-L2 laminotomy /foraminotomy   THERAPY DIAG:  Difficulty in walking, not elsewhere classified  Muscle weakness (generalized)  Other low back pain  Abnormality of gait due to impairment of balance  Rationale for Evaluation and Treatment Rehabilitation  PERTINENT HISTORY:  Osteoporosis  Arthritis Raynaud HTN 11/02/21: T12-L1, L1-L2 Laminectomy, decompression,  microdisc.  L shoulder fx and L wrist fx s/p falls  2013: 3 level PLIF   PRECAUTIONS: Fall  SUBJECTIVE:                                                                                                                                                                                      SUBJECTIVE STATEMENT: No new complaints.  I'm going to leave my cane at home next time.  I wanted to get off this cane when I graduate. Pain not rated but says it is the  same as before     PAIN:  Are you having pain? Yes: NPRS scale: 0/10 back at rest .  L/R hamstring/hip 6/10.  Pain location: back of  L leg mostly , now on right too Pain description: stiff, achy, weak Aggravating factors: getting out of bed  Relieving factors: sitting to do her housework    OBJECTIVE: (objective measures completed at initial evaluation unless otherwise dated)   DIAGNOSTIC FINDINGS:  None recent    PATIENT SURVEYS:  FOTO 54% 07/05/22   FOTO     07/19/22  54% FOTO 52% 08/16/22    SCREENING FOR RED FLAGS: Bowel or bladder incontinence: Yes: premorbid Spinal tumors: No Cauda equina syndrome: No Compression fracture: No Abdominal aneurysm: No   COGNITION: Overall cognitive status: Within functional limits for tasks assessed                          SENSATION: WFL   MUSCLE LENGTH: Hamstrings: tight Thomas test: NT    POSTURE: rounded shoulders, forward head, and left pelvic obliquity High L hip , leans Rt    PALPATION: TTP Lt. lumbar    LUMBAR ROM:    AROM eval  Flexion deferred  Extension    Right lateral flexion    Left lateral flexion    Right rotation 25%  Left rotation 25%   (Blank rows = not tested)   LOWER EXTREMITY ROM:      Passive  Right eval Left  eval  Hip flexion WNL WNL   Hip extension      Hip abduction      Hip adduction      Hip internal rotation 15-20 deg 25 deg   Hip external rotation 25 deg  30 deg   Knee flexion WNL WNL   Knee extension      Ankle dorsiflexion  stiff stiff  Ankle plantarflexion      Ankle inversion      Ankle eversion       (Blank rows = not tested)   LOWER EXTREMITY MMT:     MMT Right eval Left eval 07/12/22 right 07/12/22 left 09/01/22  Hip flexion 4 4+   4+, 4  Hip extension         Hip abduction     3 3- 3+, 3-  Hip adduction         Hip internal rotation         Hip external rotation         Knee flexion 4 4   5, 4+  Knee extension 4 4   5, 4  Ankle dorsiflexion 4+ 3+   5, 3+  Ankle plantarflexion         Ankle inversion         Ankle eversion          (Blank rows = not tested)   LUMBAR SPECIAL TESTS:  NT    FUNCTIONAL TESTS:  5 times sit to stand: 10.3 30 seconds chair stand test 9 reps, max cues to avoid using back of legs on the mat  07/05/22: TUG 22.19 sec with SPC 07/19/22: Berg 42/56 09/01/22: Sharlene Motts 47/56   GAIT: Distance walked: 150 Assistive device utilized: Single point cane and without Level of assistance: Min A pt needs close supervision with cane, min A without  Comments: foot catches on the floor, min A to recover balance   TODAY'S TREATMENT:        [facility]  Adult PT Treatment:                                                DATE: 09/16/22 Therapeutic Exercise: Nustep L5 UE and LE for 6 min  Seated Piriformis 2 ways, supine most effective  Lower trunk rotation x 10  Knee to chest with extension of knee x 10  Seated clam with green band x 15 Sit to stand green band x 15  Sit to stand with band no UE x 10  Seated march with band Standing hip abd with band  Standing heel raises Therapeutic Activity: Gait with obstacles, min A  Narrow stance eyes closed Arms overhead with cane for trunk extension x 10 Upper trunk rotation  x 10  Sidestepping with min A double HHA    [facility] Adult PT Treatment:                                                DATE: 09/13/22 Therapeutic Exercise: Nustep L4 UE/LE x 5 min Heel raises 10 x 2  Lateral step up/ down to AIREX pad x 10 R/ x10 LT Alteranting step taps  from AIREX to 6 inch step Supine clam with red band  Banded bridge red  Supine march red band  S/L clam red  Figure 4 stretch push and pull Dynamic hamstring stretch x 10 each with strap   [facility] Adult PT Treatment:                                                DATE: 09/09/22 Therapeutic Exercise: Standing hip abduction one foot on Airex standing max cues to avoid using ant hip.  High knee march  Bilateral clam red x 20  Banded bridge Bridge with march red band, alternating  Dynamic hamstring with knee to chest x 10 and ankle DF Figure 4 with cues  Therapeutic Activity: Sidestepping with obstacles  Step overs x 15 Standing on Airex holding red wgted ball : overhead press , upper trunk rotation and unilateral pass  [facility] Adult PT Treatment:                                                DATE: 09/06/22 Therapeutic Exercise: Nustep L5  STS x 15 Seated h/s stretch bilat x 2 each  Neuromuscular re-ed: Alternating step taps without UE Alternating step taps from AIREX with out UE- CGA L SLS from AIREX with hip abduction- bilat UE SLS trials 2-3 se best  SLS trials on AIREX 2 sec best  AIREX stepping on and off without UE AIREX narrow stance with therapist pertubations    [facility] Adult PT Treatment:                                                DATE: 09/01/22 Therapeutic Exercise: NuStep  L 5 UE and LE for 6 min  MMT see above  Clam in sidelying x 15 mod cues  Therapeutic Activity: BERG BALANCE  Sitting to Standing: Numbers; 0-4: 4  4. Stands without using hands and stabilize independently  3. Stands independently using hands  2. Stands using hands after multiple trials  1. Min A to stand  0. Mod-Max A to stand Standing unsupported: Numbers; 0-4: 4  4. Stands safely for 2 minutes  3. Stands 2 minutes with supervision  2. Stands 30 seconds unsupported  1. Needs several tries to stand unsupported for 30 seconds  0. Unable to stand unsupported for 30 seconds Sitting  unsupported: Numbers; 0-4: 4  4. Sits for 2 minutes independently  3. Sits for 2 minutes with supervision  2. Able to sit 30 seconds  1. Able to sit 10 seconds  0. Unable to sit for 10 seconds Standing to Sitting: Numbers; 0-4: 4 4. Sits safely with minimal use of hands 3. Controls descent with hands 2. Uses back of legs against chair to control descent 1. Sits independently, but uncontrolled descent 0. Needs assistance Transfers: Numbers; 0-4: 4  4. Transfers safely with minor use of hands  3. Transfers safely definite use of hands  2. Transfers with verbal cueing/supervision  1. Needs 1 person assist  0. Needs 2 person assist  Standing with eyes closed: Numbers; 0-4: 4  4. Stands safely for 10 seconds  3. Stands 10 seconds with supervision   2. Able to stand for 3 seconds  1. Unable to keep eyes closed for 3 seconds, but is safe  0. Needs assist to keep from falling Standing with feet together: Numbers; 0-4: 4 4. Stands for 1 minute safely 3. Stands for 1 minute with supervision 2. Unable to hold for 30 seconds  1. Needs help to attain position but can hold for 15 seconds  0. Needs help to attain position and unable to hold for 15 seconds Reaching forward with outstretched arm: Numbers; 0-4: 3  4. Reaches forward 10 inches  3. Reaches forward 5 inches  2. Reaches forward 2 inches  1. Reaches forward with supervision  0. Loses balance/requires assistace Retrieving object from the floor: Numbers; 0-4: 4 4. Able to pick up easily and safely 3. Able to pick up with supervision 2. Unable to pick up, but reaches within 1-2 inches independently 1. Unable to pick up and needs supervision 0. Unable/needs assistance to keep from falling  Turning to look behind: Numbers; 0-4: 3  4. Looks behind from both sides and weight shifts well  3. Looks behind one side only, other side less weight shift  2. Turns sideways only, maintains balance  1. Needs supervision when turning  0. Needs  assistance  Turning 360 degrees: Numbers; 0-4: 2  4. Able to turn in </=4 seconds  3. Able to turn on one side in </= 4 seconds   2. Able to turn slowly, but safely  1. Needs supervision or verbal cueing  0. Needs assistance Place alternate foot on stool: Numbers; 0-4: 3 4. Completes 8 steps in 20 seconds 3. Completes 8 steps in >20 seconds 2. 4 steps without assistance/supervision 1. Completes >2 steps with minimal assist 0. Unable, needs assist to keep from falling Standing with one foot in front: Numbers; 0-4: 2  4. Independent tandem for 30 seconds  3. Independent foot ahead for 30 seconds  2. Independent small step for 30 seconds  1. Needs help to step,  but can hold for 15 seconds  0. Loses balance while standing/stepping Standing on one foot: Numbers; 0-4: 2 4. Holds >10 seconds 3. Holds 5-10 seconds 2. Holds >/=3 seconds  1. Holds <3 seconds 0. Unable   Total Score: 47/56    [facility] Adult PT Treatment:                                                DATE: 08/30/22 Therapeutic Exercise: Nustep 5 minutes Standing hip abdct yellow  4 inch lateral step up Heel raises x 20  Therapeutic Activity: Gait around obstacles- figure 8s Stepping over obstacles Tandem gait on tandem AIREX -1 Ue support at counter Tandem stance on tandem AIREX - needs light touch Gait with head turns   [facility] Adult PT Treatment:                                                DATE: 08/16/22 Therapeutic Exercise: Standing hip abdct yellow band at knees 10 x 2  Standing hip ext yellow band at knees 10 x 2  Bilat heel raises  Tandem trial 5-10 sec best Nustep L4 UE /LE x 5 minutes  Therapeutic Activity: Gait with head turns Gait with nods Gait with eyes closed  Stepping over hurdles step- to and alternating - needs UE to alternate     PATIENT EDUCATION:  Education details: balance testing, progress Person educated: Patient Education method: Explanation, Demonstration, Verbal cues, and  Handouts Education comprehension: verbalized understanding and needs further education   HOME EXERCISE PROGRAM: Access Code: [email or web address] URL: [email or web address] Date: 06/10/2022 Prepared by: Karie Mainland   Access Code: [email or web address] URL: [email or web address] Date: 07/26/2022 Prepared by: Karie Mainland  Exercises - Supine Lower Trunk Rotation  - 1 x daily - 7 x weekly - 2 sets - 10 reps - 10 hold - Hooklying Single Knee to Chest Stretch  - 1 x daily - 7 x weekly - 1 sets - 10 reps - 10 hold - Sit to Stand with Counter Support  - 3 x daily - 7 x weekly - 2 sets - 10 reps - 5 hold - Supine Bridge  - 1 x daily - 7 x weekly - 1-2 sets - 10 reps - 3-5 hold - Hooklying Clamshell with Resistance  - 2 x daily - 7 x weekly - 1-2 sets - 10 reps - slow hold - Standing Tandem Balance with Counter Support  - 1 x daily - 7 x weekly - 1 sets - 3 reps - 10-15 hold - Standing Hip Abduction with Counter Support  - 1 x daily - 7 x weekly - 2 sets - 10 reps - Seated Hamstring Stretch  - 1 x daily - 7 x weekly - 1 sets - 5 reps - 30 hold - Supine Piriformis Stretch  - 1 x daily - 7 x weekly - 1 sets - 3 reps - 30 hold - Seated Figure 4 Piriformis Stretch  - 1 x daily - 7 x weekly - 1 sets - 3 reps - 30 hold   ASSESSMENT:   CLINICAL IMPRESSION: Patient required minimal assist without pain for obstacle and sidestepping today.  She is unsteady when moving laterally due to hip weakness and coordination.  Reinforced the need for her to use her cane when she is not at home.  She does her exercises at home but needs cueing throughout for stretches for her hip.  She has 1 more visit at that time she will be discharged if she has maximized her potential for improvement.    OBJECTIVE IMPAIRMENTS: Abnormal gait, decreased activity tolerance, decreased balance, decreased coordination, decreased knowledge of use of DME, decreased mobility, difficulty walking, decreased ROM, decreased strength,  decreased safety awareness, increased fascial restrictions, improper body mechanics, postural dysfunction, and pain.    ACTIVITY LIMITATIONS: carrying, lifting, bending, standing, squatting, sleeping, stairs, transfers, bed mobility, and locomotion level   PARTICIPATION LIMITATIONS: meal prep, cleaning, laundry, shopping, community activity, and church   PERSONAL FACTORS: Age and 1-2 comorbidities: osteoporosis, precious back surgeries  are also affecting patient's functional outcome.    REHAB POTENTIAL: Good   CLINICAL DECISION MAKING: Evolving/moderate complexity   EVALUATION COMPLEXITY: Moderate     GOALS:     LONG TERM GOALS: Target date:09/27/22     Pt will be I with HEP Baseline: given basic on eval  Goal status: ongoing  2.  Pt will complete balance testing and goal set Baseline: TUG, 22 sec, Berg 42/56 - GOAL is 48/56  Goal status: NEW GOAL    3.  Pt will be able to safely Sit to stand without compensations in 15 sec or less  Baseline: Increased postural sway from elevated mat table.  Needs close supervision due to momentum when rising.  Goal status: ongoing    4.  Pt will be able to ambulate with cane without cues and mod I , 300 feet while carrying handbag  Baseline: mod cues , min A given on Re-assessment (6/24) Goal status:ongoing     5. Pt will not be limited by pain in her back for home tasks  Baseline:  Goal status:ongoing      PLAN:   PT FREQUENCY: 1-2x/week   PT DURATION: 6 weeks   PLANNED INTERVENTIONS: Therapeutic exercises, Therapeutic activity, Neuromuscular re-education, Balance training, Gait training, Patient/Family education, Self Care, Stair training, DME instructions, Cryotherapy, Moist heat, Manual therapy, and Re-evaluation.    PLAN FOR NEXT SESSION: DC FOTO and final balance work, obstacles , HEP, more hip stab an d stretches/ manual to reduce pain.   Karie Mainland, PT 09/16/22 11:54 AM Phone: [phone] Fax: [phone]

## 2022-09-20 ENCOUNTER — Ambulatory Visit: Payer: Medicare Other | Admitting: Physical Therapy

## 2022-09-20 ENCOUNTER — Encounter: Payer: Self-pay | Admitting: Physical Therapy

## 2022-09-20 DIAGNOSIS — M25552 Pain in left hip: Secondary | ICD-10-CM

## 2022-09-20 DIAGNOSIS — M6281 Muscle weakness (generalized): Secondary | ICD-10-CM

## 2022-09-20 DIAGNOSIS — M5442 Lumbago with sciatica, left side: Secondary | ICD-10-CM | POA: Diagnosis not present

## 2022-09-20 DIAGNOSIS — R2689 Other abnormalities of gait and mobility: Secondary | ICD-10-CM

## 2022-09-20 DIAGNOSIS — R293 Abnormal posture: Secondary | ICD-10-CM | POA: Diagnosis not present

## 2022-09-20 DIAGNOSIS — M5459 Other low back pain: Secondary | ICD-10-CM | POA: Diagnosis not present

## 2022-09-20 DIAGNOSIS — R262 Difficulty in walking, not elsewhere classified: Secondary | ICD-10-CM

## 2022-09-20 DIAGNOSIS — G8929 Other chronic pain: Secondary | ICD-10-CM | POA: Diagnosis not present

## 2022-09-20 NOTE — Therapy (Signed)
OUTPATIENT PHYSICAL THERAPY TREATMENT NOTE DISCHARGE    Patient Name: Danielle Hernandez MRN: [id number] DOB:10/03/1935, 87 y.o., female Today's Date: 09/20/2022  PCP: Dr. Lupita Raider   REFERRING PROVIDER: Tressie Stalker, MD  END OF SESSION:   PT End of Session - 09/20/22 1104     Visit Number 19    Number of Visits 24    Date for PT Re-Evaluation 09/27/22    Authorization Type [employer]    Progress Note Due on Visit 10    PT Start Time 1101    PT Stop Time 1145    PT Time Calculation (min) 44 min    Activity Tolerance Patient tolerated treatment well    Behavior During Therapy WFL for tasks assessed/performed                   Past Medical History:  Diagnosis Date   Arthritis    Headache(784.0)    HTN (hypertension)    Osteoporosis    Raynaud disease    Past Surgical History:  Procedure Laterality Date   ABDOMINAL HYSTERECTOMY     BACK SURGERY     EYE SURGERY     Bilateral cataracts   LUMBAR LAMINECTOMY/DECOMPRESSION MICRODISCECTOMY Bilateral 11/02/2021   Procedure: T12-L1, L1-L2 LAM,FORAM;  Surgeon: Tressie Stalker, MD;  Location: [facility] OR;  Service: Neurosurgery;  Laterality: Bilateral;  3C   TONSILLECTOMY     under chin cyst removed     WRIST GANGLION EXCISION     right hand   Patient Active Problem List   Diagnosis Date Noted   Myelopathy concurrent with and due to spinal stenosis of thoracic region (HCC) 11/02/2021   CDNH (chondrodermatitis nodularis helicis), right 02/21/2018   Impacted cerumen of left ear 03/04/2016   Fecal incontinence 04/26/2012   Lumbar stenosis 02/08/2011    REFERRING DIAG: spinal stenosis s/p L1-L2 laminotomy /foraminotomy   THERAPY DIAG:  Difficulty in walking, not elsewhere classified  Muscle weakness (generalized)  Other low back pain  Abnormality of gait due to impairment of balance  Pain in left hip  Abnormal posture  Rationale for Evaluation and Treatment Rehabilitation  PERTINENT HISTORY:   Osteoporosis  Arthritis Raynaud HTN 11/02/21: T12-L1, L1-L2 Laminectomy, decompression, microdisc.  L shoulder fx and L wrist fx s/p falls  2013: 3 level PLIF   PRECAUTIONS: Fall  SUBJECTIVE:                                                                                                                                                                                      SUBJECTIVE STATEMENT: Pt did not walk in with her cane.  She did it on purpose  because its her last day.  She reports a fall in her yard.  She pulled herself up from the ground using a ledge in the garden.  Back was tired yesterday.  Today its not too bad.     PAIN:  Are you having pain? Yes: NPRS scale: 0/10 back at rest .  L/R hamstring/hip 6/10.  Pain location: back of  L leg mostly , now on right too Pain description: stiff, achy, weak Aggravating factors: getting out of bed  Relieving factors: sitting to do her housework    OBJECTIVE: (objective measures completed at initial evaluation unless otherwise dated)   DIAGNOSTIC FINDINGS:  None recent    PATIENT SURVEYS:  FOTO 54% 07/05/22   FOTO     07/19/22  54% FOTO 52% 08/16/22 FOTO 51% 09/20/22    SCREENING FOR RED FLAGS: Bowel or bladder incontinence: Yes: premorbid Spinal tumors: No Cauda equina syndrome: No Compression fracture: No Abdominal aneurysm: No   COGNITION: Overall cognitive status: Within functional limits for tasks assessed                          SENSATION: WFL   MUSCLE LENGTH: Hamstrings: tight Thomas test: NT    POSTURE: rounded shoulders, forward head, and left pelvic obliquity High L hip , leans Rt    PALPATION: TTP Lt. lumbar    LUMBAR ROM:    AROM eval 09/20/22  Flexion deferred NT   Extension   90% limited   Right lateral flexion   75%  Left lateral flexion   75%  Right rotation 25% 25% limited min pain   Left rotation 25% 25% limited min pain    (Blank rows = not tested)   LOWER EXTREMITY ROM:       Passive  Right eval Left  eval  Hip flexion WNL WNL   Hip extension      Hip abduction      Hip adduction      Hip internal rotation 15-20 deg 25 deg   Hip external rotation 25 deg  30 deg   Knee flexion WNL WNL   Knee extension      Ankle dorsiflexion stiff stiff  Ankle plantarflexion      Ankle inversion      Ankle eversion       (Blank rows = not tested)   LOWER EXTREMITY MMT:     MMT Right eval Left eval 07/12/22 right 07/12/22 left 09/01/22 09/20/22  Hip flexion 4 4+   4+, 4 4+, 4  Hip extension          Hip abduction     3 3- 3+, 3- 3+, 3  Hip adduction          Hip internal rotation          Hip external rotation          Knee flexion 4 4   5, 4+ 5, 4+  Knee extension 4 4   5, 4 5, 4+  Ankle dorsiflexion 4+ 3+   5, 3+ 5, 3+  Ankle plantarflexion          Ankle inversion          Ankle eversion           (Blank rows = not tested)   LUMBAR SPECIAL TESTS:  NT    FUNCTIONAL TESTS:  5 times sit to stand: 10.3 30 seconds chair stand test 9 reps, max cues  to avoid using back of legs on the mat  07/05/22: TUG 22.19 sec with SPC 07/19/22: Berg 42/56 8/7/24Sharlene Motts 47/56 09/20/22: TUG x 3 no cane and then with cane   With cane 20 sec , 18.8 sec, then 15.7 sec   With cane 13 sec!!   GAIT: Distance walked: 150 Assistive device utilized: Single point cane and without Level of assistance: Min A pt needs close supervision with cane, min A without  Comments: foot catches on the floor, min A to recover balance   TODAY'S TREATMENT:       [facility] Adult PT Treatment:                                                DATE: 09/20/22 Therapeutic Exercise: NuStep L 4 UE and LE for 6 min  Hip abduction standing, added leaning on countertop to discourage hip flexion x 15  Hip extension x 15  Seated piriformis stretching  Therapeutic Activity: TUG see above  Standing tandem balance Sit to stand 12 sec  Sit to stand x 10 with wider  MMT and trunk ROM    [facility] Adult PT Treatment:                                                 DATE: 09/16/22 Therapeutic Exercise: Nustep L5 UE and LE for 6 min  Seated Piriformis 2 ways, supine most effective  Lower trunk rotation x 10  Knee to chest with extension of knee x 10  Seated clam with green band x 15 Sit to stand green band x 15  Sit to stand with band no UE x 10  Seated march with band Standing hip abd with band  Standing heel raises Therapeutic Activity: Gait with obstacles, min A  Narrow stance eyes closed Arms overhead with cane for trunk extension x 10 Upper trunk rotation  x 10  Sidestepping with min A double HHA    [facility] Adult PT Treatment:                                                DATE: 09/13/22 Therapeutic Exercise: Nustep L4 UE/LE x 5 min Heel raises 10 x 2  Lateral step up/ down to AIREX pad x 10 R/ x10 LT Alteranting step taps from AIREX to 6 inch step Supine clam with red band  Banded bridge red  Supine march red band  S/L clam red  Figure 4 stretch push and pull Dynamic hamstring stretch x 10 each with strap   [facility] Adult PT Treatment:                                                DATE: 09/09/22 Therapeutic Exercise: Standing hip abduction one foot on Airex standing max cues to avoid using ant hip.  High knee march  Bilateral clam red x 20  Banded bridge Bridge with march red band, alternating  Dynamic hamstring with knee to  chest x 10 and ankle DF Figure 4 with cues  Therapeutic Activity: Sidestepping with obstacles  Step overs x 15 Standing on Airex holding red wgted ball : overhead press , upper trunk rotation and unilateral pass  [facility] Adult PT Treatment:                                                DATE: 09/06/22 Therapeutic Exercise: Nustep L5  STS x 15 Seated h/s stretch bilat x 2 each  Neuromuscular re-ed: Alternating step taps without UE Alternating step taps from AIREX with out UE- CGA L SLS from AIREX with hip abduction- bilat UE SLS trials 2-3 se best  SLS trials on  AIREX 2 sec best  AIREX stepping on and off without UE AIREX narrow stance with therapist pertubations       PATIENT EDUCATION:  Education details: Discharge, Use of cane.  HEP safety Person educated: Patient Education method: Explanation, Demonstration, Verbal cues, and Handouts Education comprehension: verbalized understanding and needs further education   HOME EXERCISE PROGRAM:  Access Code: [email or web address] URL: [email or web address] Date: 07/26/2022 Prepared by: Karie Mainland  Exercises - Supine Lower Trunk Rotation  - 1 x daily - 7 x weekly - 2 sets - 10 reps - 10 hold - Hooklying Single Knee to Chest Stretch  - 1 x daily - 7 x weekly - 1 sets - 10 reps - 10 hold - Sit to Stand with Counter Support  - 3 x daily - 7 x weekly - 2 sets - 10 reps - 5 hold - Supine Bridge  - 1 x daily - 7 x weekly - 1-2 sets - 10 reps - 3-5 hold - Hooklying Clamshell with Resistance  - 2 x daily - 7 x weekly - 1-2 sets - 10 reps - slow hold  - Standing Tandem Balance with Counter Support  - 1 x daily - 7 x weekly - 1 sets - 3 reps - 10-15 hold - Standing Hip Abduction with Counter Support  - 1 x daily - 7 x weekly - 2 sets - 10 reps  - Seated Hamstring Stretch  - 1 x daily - 7 x weekly - 1 sets - 5 reps - 30 hold - Supine Piriformis Stretch  - 1 x daily - 7 x weekly - 1 sets - 3 reps - 30 hold - Seated Figure 4 Piriformis Stretch  - 1 x daily - 7 x weekly - 1 sets - 3 reps - 30 hold   ASSESSMENT:   CLINICAL IMPRESSION:  Patient has shown improvement in function and balance when using her cane.  She remains unsteady without the cane and in fact, did not bring it in today.  She has not improved with FOTO score but much of that is due to frailty with aging.  She continues to have back pain which is min to moderate and constant posterior hip pain bilaterally.  She has at this time maximized her potential here but she knows if things change she can come back with a new referral.   OBJECTIVE  IMPAIRMENTS: Abnormal gait, decreased activity tolerance, decreased balance, decreased coordination, decreased knowledge of use of DME, decreased mobility, difficulty walking, decreased ROM, decreased strength, decreased safety awareness, increased fascial restrictions, improper body mechanics, postural dysfunction, and pain.   ACTIVITY LIMITATIONS: carrying, lifting, bending, standing, squatting, sleeping, stairs, transfers,  bed mobility, and locomotion level   PARTICIPATION LIMITATIONS: meal prep, cleaning, laundry, shopping, community activity, and church   PERSONAL FACTORS: Age and 1-2 comorbidities: osteoporosis, precious back surgeries  are also affecting patient's functional outcome.    REHAB POTENTIAL: Good   CLINICAL DECISION MAKING: Evolving/moderate complexity   EVALUATION COMPLEXITY: Moderate     GOALS:     LONG TERM GOALS: Target date:09/27/22     Pt will be I with HEP Baseline: given basic on eval  Goal status: MET   2.  Pt will complete balance testing and goal sete Baseline: TUG, 22 sec, Berg 42/56 - GOAL is 48/56  Goal status: MET for TUG and for BERG   3.  Pt will be able to safely Sit to stand without compensations in 15 sec or less  Baseline: Increased postural sway from elevated mat table.   Needs close supervision due to momentum when rising.  Goal status: MET    4.  Pt will be able to ambulate with cane without cues and mod I , 300 feet while carrying handbag  Baseline: mod cues , min A given on Re-assessment (6/24) Goal status:MET with cane.  Supervision without cane.     5. Pt will not be limited by pain in her back for home tasks  Baseline: limited moderately  Goal status: partially met, has her granddaughter do her vacuuming, still limited.      PLAN:   PT FREQUENCY: 1-2x/week   PT DURATION: 6 weeks   PLANNED INTERVENTIONS: Therapeutic exercises, Therapeutic activity, Neuromuscular re-education, Balance training, Gait training, Patient/Family  education, Self Care, Stair training, DME instructions, Cryotherapy, Moist heat, Manual therapy, and Re-evaluation.    PLAN FOR NEXT SESSION: NA   Karie Mainland, PT 09/20/22 1:17 PM Phone: [phone] Fax: [phone]   PHYSICAL THERAPY DISCHARGE SUMMARY  Visits from Start of Care: 19  Current functional level related to goals / functional outcomes: See above    Remaining deficits: Hip and ankle DF strength, balance and gait    Education / Equipment: HEP, posture, gait, safety, fall risk   Patient agrees to discharge. Patient goals were met. Patient is being discharged due to maximized rehab potential.   Karie Mainland, PT 09/20/22 1:21 PM Phone: [phone] Fax: [phone]

## 2022-09-29 DIAGNOSIS — Z23 Encounter for immunization: Secondary | ICD-10-CM | POA: Diagnosis not present

## 2022-09-29 DIAGNOSIS — B029 Zoster without complications: Secondary | ICD-10-CM | POA: Diagnosis not present

## 2022-10-13 DIAGNOSIS — M4800 Spinal stenosis, site unspecified: Secondary | ICD-10-CM | POA: Diagnosis not present

## 2022-10-13 DIAGNOSIS — B029 Zoster without complications: Secondary | ICD-10-CM | POA: Diagnosis not present

## 2022-10-28 DIAGNOSIS — R52 Pain, unspecified: Secondary | ICD-10-CM | POA: Diagnosis not present

## 2022-10-28 DIAGNOSIS — M4800 Spinal stenosis, site unspecified: Secondary | ICD-10-CM | POA: Diagnosis not present

## 2022-10-28 DIAGNOSIS — G8929 Other chronic pain: Secondary | ICD-10-CM | POA: Diagnosis not present

## 2022-10-28 DIAGNOSIS — M792 Neuralgia and neuritis, unspecified: Secondary | ICD-10-CM | POA: Diagnosis not present

## 2022-11-03 DIAGNOSIS — N3020 Other chronic cystitis without hematuria: Secondary | ICD-10-CM | POA: Diagnosis not present

## 2022-12-13 DIAGNOSIS — H6122 Impacted cerumen, left ear: Secondary | ICD-10-CM | POA: Diagnosis not present

## 2022-12-13 DIAGNOSIS — H608X2 Other otitis externa, left ear: Secondary | ICD-10-CM | POA: Diagnosis not present

## 2023-03-03 DIAGNOSIS — Z79899 Other long term (current) drug therapy: Secondary | ICD-10-CM | POA: Diagnosis not present

## 2023-03-03 DIAGNOSIS — Z23 Encounter for immunization: Secondary | ICD-10-CM | POA: Diagnosis not present

## 2023-03-03 DIAGNOSIS — M810 Age-related osteoporosis without current pathological fracture: Secondary | ICD-10-CM | POA: Diagnosis not present

## 2023-03-03 DIAGNOSIS — J309 Allergic rhinitis, unspecified: Secondary | ICD-10-CM | POA: Diagnosis not present

## 2023-03-03 DIAGNOSIS — M48062 Spinal stenosis, lumbar region with neurogenic claudication: Secondary | ICD-10-CM | POA: Diagnosis not present

## 2023-03-03 DIAGNOSIS — K5790 Diverticulosis of intestine, part unspecified, without perforation or abscess without bleeding: Secondary | ICD-10-CM | POA: Diagnosis not present

## 2023-03-03 DIAGNOSIS — Z0000 Encounter for general adult medical examination without abnormal findings: Secondary | ICD-10-CM | POA: Diagnosis not present

## 2023-03-03 DIAGNOSIS — N3020 Other chronic cystitis without hematuria: Secondary | ICD-10-CM | POA: Diagnosis not present

## 2023-03-03 DIAGNOSIS — N816 Rectocele: Secondary | ICD-10-CM | POA: Diagnosis not present

## 2023-03-21 DIAGNOSIS — H608X2 Other otitis externa, left ear: Secondary | ICD-10-CM | POA: Diagnosis not present

## 2023-03-21 DIAGNOSIS — H6122 Impacted cerumen, left ear: Secondary | ICD-10-CM | POA: Diagnosis not present

## 2023-03-22 DIAGNOSIS — R300 Dysuria: Secondary | ICD-10-CM | POA: Diagnosis not present

## 2023-03-22 DIAGNOSIS — R21 Rash and other nonspecific skin eruption: Secondary | ICD-10-CM | POA: Diagnosis not present

## 2023-03-23 DIAGNOSIS — R29890 Loss of height: Secondary | ICD-10-CM | POA: Diagnosis not present

## 2023-03-23 DIAGNOSIS — Z1231 Encounter for screening mammogram for malignant neoplasm of breast: Secondary | ICD-10-CM | POA: Diagnosis not present

## 2023-03-23 DIAGNOSIS — M810 Age-related osteoporosis without current pathological fracture: Secondary | ICD-10-CM | POA: Diagnosis not present

## 2023-03-23 DIAGNOSIS — M8588 Other specified disorders of bone density and structure, other site: Secondary | ICD-10-CM | POA: Diagnosis not present

## 2023-06-21 DIAGNOSIS — H608X2 Other otitis externa, left ear: Secondary | ICD-10-CM | POA: Diagnosis not present

## 2023-06-21 DIAGNOSIS — H6122 Impacted cerumen, left ear: Secondary | ICD-10-CM | POA: Diagnosis not present

## 2023-09-21 DIAGNOSIS — H6122 Impacted cerumen, left ear: Secondary | ICD-10-CM | POA: Diagnosis not present

## 2023-09-21 DIAGNOSIS — H6062 Unspecified chronic otitis externa, left ear: Secondary | ICD-10-CM | POA: Diagnosis not present

## 2023-10-12 DIAGNOSIS — R300 Dysuria: Secondary | ICD-10-CM | POA: Diagnosis not present
